# Patient Record
Sex: Male | Born: 1955 | Hispanic: No | Marital: Married | Smoking: Former smoker
Health system: Southern US, Community
[De-identification: ages and names within clinical notes are randomized; demographics above are authoritative.]

## PROBLEM LIST (undated history)

## (undated) DIAGNOSIS — I714 Abdominal aortic aneurysm, without rupture, unspecified: Secondary | ICD-10-CM

## (undated) DIAGNOSIS — J439 Emphysema, unspecified: Secondary | ICD-10-CM

## (undated) DIAGNOSIS — R351 Nocturia: Secondary | ICD-10-CM

## (undated) DIAGNOSIS — C679 Malignant neoplasm of bladder, unspecified: Secondary | ICD-10-CM

## (undated) DIAGNOSIS — Z973 Presence of spectacles and contact lenses: Secondary | ICD-10-CM

## (undated) DIAGNOSIS — I10 Essential (primary) hypertension: Secondary | ICD-10-CM

## (undated) DIAGNOSIS — R0609 Other forms of dyspnea: Secondary | ICD-10-CM

## (undated) DIAGNOSIS — I2510 Atherosclerotic heart disease of native coronary artery without angina pectoris: Secondary | ICD-10-CM

## (undated) DIAGNOSIS — R0600 Dyspnea, unspecified: Secondary | ICD-10-CM

## (undated) DIAGNOSIS — M352 Behcet's disease: Secondary | ICD-10-CM

## (undated) HISTORY — DX: Essential (primary) hypertension: I10

---

## 2011-11-14 ENCOUNTER — Ambulatory Visit: Payer: Self-pay | Admitting: Emergency Medicine

## 2011-11-14 VITALS — BP 153/90 | HR 84 | Temp 98.3°F | Resp 18 | Ht 70.0 in | Wt 229.2 lb

## 2011-11-14 DIAGNOSIS — I10 Essential (primary) hypertension: Secondary | ICD-10-CM

## 2011-11-14 MED ORDER — HYDROCHLOROTHIAZIDE 25 MG PO TABS: 25.0000 mg | ORAL_TABLET | Freq: Every day | ORAL | Status: DC

## 2011-11-14 NOTE — Progress Notes (Signed)
[facility] [address] [place] [address] [phone]  Date:  11/14/2011   Name:  Darius [name]   DOB:  September 10, 1955   MRN:  [id number]  PCP:  No primary provider on file.    Chief Complaint: Dizziness   History of Present Illness:  Darius [name] is a 56 y.o. very pleasant male patient who presents with the following:  Was told in past that he had elevated blood pressure and that he should take medication for it.  He has no health insurance and declined to do so.  He has never been screened for colon cancer, dyslipidemia, diabetes.  No ongoing medical care.  Yesterday had a dizzy spell and had his blood pressure checked an it was elevated in the 160 range.  He denies nausea, vomiting, visual or neurologic or cardiac symptoms.  Smokes a pack a day and takes no medication.  Is concerned that his pressure is elevated.  There is no problem list on file for this patient.   History reviewed. No pertinent past medical history.  History reviewed. No pertinent past surgical history.  History  Substance Use Topics  . Smoking status: Current Every Day Smoker  . Smokeless tobacco: Not on file  . Alcohol Use: No    History reviewed. No pertinent family history.  No Known Allergies  Medication list has been reviewed and updated.  No current outpatient prescriptions on file prior to visit.    Review of Systems:  As per HPI, otherwise negative.    Physical Examination: Filed Vitals:   11/14/11 1704  BP: 153/90  Pulse: 84  Temp: 98.3 F (36.8 C)  Resp: 18   Filed Vitals:   11/14/11 1704  Height: 5\' 10"  (1.778 m)  Weight: 229 lb 3.2 oz (103.964 kg)   Body mass index is 32.89 kg/(m^2). Ideal Body Weight: Weight in (lb) to have BMI = 25: 173.9   GEN: WDWN, NAD, Non-toxic, A & O x 3.  No shortness of breath. HEENT: Atraumatic, Normocephalic. Neck supple. No masses, No LAD.  PRRERLA EOMI CN 2-12 intact Ears and Nose: No external deformity.  TM  negative. CV: RRR, No M/G/R. No JVD. No thrill. No extra heart sounds.  No bruit PULM: CTA B, no wheezes, crackles, rhonchi. No retractions. No resp. distress. No accessory muscle use. ABD: S, NT, ND, +BS. No rebound. No HSM. EXTR: No c/c/e NEURO Normal gait, balance and coordination. PSYCH: Normally interactive. Conversant. Not depressed or anxious appearing.  Calm demeanor.    Assessment and Plan: Hypertension hctz Follow up in one month ASA daily  Carmelina Dane, MD

## 2012-02-12 ENCOUNTER — Other Ambulatory Visit: Payer: Self-pay | Admitting: Emergency Medicine

## 2012-11-09 ENCOUNTER — Ambulatory Visit: Payer: Self-pay | Admitting: Emergency Medicine

## 2012-11-09 VITALS — BP 110/70 | HR 106 | Temp 98.3°F | Resp 16 | Ht 69.0 in | Wt 226.0 lb

## 2012-11-09 DIAGNOSIS — N4889 Other specified disorders of penis: Secondary | ICD-10-CM

## 2012-11-09 DIAGNOSIS — N5089 Other specified disorders of the male genital organs: Secondary | ICD-10-CM

## 2012-11-09 DIAGNOSIS — N508 Other specified disorders of male genital organs: Secondary | ICD-10-CM

## 2012-11-09 LAB — POCT URINALYSIS DIPSTICK
Blood, UA: NEGATIVE
Glucose, UA: NEGATIVE
Nitrite, UA: NEGATIVE
Protein, UA: NEGATIVE
Spec Grav, UA: >=1.03
Urobilinogen, UA: 0.2
pH, UA: 5

## 2012-11-09 LAB — POCT UA - MICROSCOPIC ONLY
Bacteria, U Microscopic: NEGATIVE
Casts, Ur, LPF, POC: NEGATIVE
Crystals, Ur, HPF, POC: NEGATIVE
Mucus, UA: NEGATIVE
Yeast, UA: NEGATIVE

## 2012-11-09 NOTE — Patient Instructions (Signed)
Testicular Masses  Most testicular masses, such as a growth or a swelling, are benign. This means they are not cancerous. Common types of testicular masses include:    Hydrocele is the most common benign testicular mass in an adult. Hydroceles are generally soft, painless scrotal swellings that are collections of fluid in the scrotal sac. These can rapidly change size as the fluid enters or leaves.   Spermatoceles are generally soft, painless, benign swellings that are cyst-like masses in the scrotum containing fluid. They can rapidly change size as the fluid enters or leaves. They are more prominent while standing or exercising. Sometimes, spermatoceles may cause a sensation of heaviness or a dull ache.   Varicocele is an enlargement of the veins that drain the testicles. This condition can increase the risk of infertility. They are more prominent while standing or exercising. Sometimes, varicoceles may cause a sensation of heaviness or a dull ache.   Inguinal hernia is a bulge caused by a portion of intestine protruding into the scrotum through a weak area in the abdominal muscles. Hernias may or may not be painful. They are soft and usually enlarge with coughing or straining.   Torsion of the testis can cause a testicular mass that develops quickly and is associated with tenderness and/or fever. This is caused by a twisting of the testicle within the sac. It also reduces the blood supply and can destroy the testis if not treated quickly with surgery.   Epididymitis is inflammation of the epididymis (a structure attached to the testicle), usually caused by a sexually transmitted infection or a urinary tract infection. This generally shows up as testicular discomfort and swelling, and may include pain during urination.   Testicular appendages are remnants of tissue on the testis present since birth. A testicular appendage can twist on its blood supply and cause pain. In most cases, this is seen as a blue dot  on the scrotum.  A cancerous growth in the scrotum may first appear as a swelling. There may or may not be pain. The growth usually feels firm and shows up as a growth on the testicle. Any solid, firm growth in a testicle is considered cancer until proven otherwise.  Cancer of the testicle most commonly affects men 20 to 55 years old. Risk factors include prior testicular tumor and cryptorchidism (undescended testis). Occasionally, testicular cancer may appear with symptoms (problems) of metastasis. This means the tumor (abnormal growth) has spread and is causing other problems that may include cough, shortness of breath or weight loss. Monthly testicular self-exams are recommended for all men. Get in the habit of examining your own testicles. A good time is while taking a shower. Get to know what your testicles feel like so you will know if there is a new growth or change in them.  DIAGNOSIS   See your caregiver if you feel a growth in your testicle. Sometimes, all that is needed to make the diagnosis (determine what is wrong) is a physical exam. Your caregiver may shine a bright light through the scrotum to help make the diagnosis. This is called transillumination. The light will shine easily through a collection of fluid but will usually not shine through a tumor. Other testing, including blood tests and an ultrasound exam, may be done. An ultrasound exam bounces harmless sound waves off the testicles and produces a black and white picture almost like that produced by a camera. Diagnosis of testicular cancer can be made by measuring several substances in the   What is wrong determines how it is treated. Small hydroceles and spermatoceles often require no treatment. In some cases, however, they may be treated surgically. Hernias are repaired with surgery. Because epididymitis is usually caused by an infection, it is usually  treated with antibiotics. Varicoceles may be treated by surgery to tie off the affected veins. Testicular cancer treatment depends upon the type of cancer. Sometimes, some tissue is removed surgically as a way of trying to preserve the testicle but if a tumor is suspected, the preferred treatment is removal of the entire testicle. Further treatment may include watching the growth with strict follow-up, chemotherapy or radiation. If a growth has been found in a testicle, your caregiver will help you determine the best treatment. Document Released: 07/06/2002 Document Revised: 03/24/2011 Document Reviewed: 12/30/2004 [facility] Patient Information 2014 [place], [place]. Hydrocele, Adult Fluid can collect around the testicles. This fluid forms in a sac. This condition is called a hydrocele. The collected fluid causes swelling of the scrotum. Usually, it affects just one testicle. Most of the time, the condition does not cause pain. Sometimes, the hydrocele goes away on its own. Other times, surgery is needed to get rid of the fluid. CAUSES A hydrocele does not develop often. Different things can cause a hydrocele in a man, including:  Injury to the scrotum.  Infection.  X-ray of the area around the scrotum.  A tumor or cancer of the testicle.  Twisting of a testicle.  Decreased blood flow to the scrotum. SYMPTOMS   Swelling without pain. The hydrocele feels like a water-filled balloon.  Swelling with pain. This can occur if the hydrocele was caused by infection or twisting.  Mild discomfort in the scrotum.  The hydrocele may feel heavy.  Swelling that gets smaller when you lie down. DIAGNOSIS  Your caregiver will do a physical exam to decide if you have a hydrocele. This may include:  Asking questions about your overall health, today and in the past. Your caregiver may ask about any injuries, X-rays, or infections.  Pushing on your abdomen or asking you to change positions to see if the  size of the hydrocele changes.  Shining a light through the scrotum (transillumination) to see if the fluid inside the scrotum is clear.  Blood tests and urine tests to check for infection.  Imaging studies that take pictures of the scrotum and testicles. TREATMENT  Treatment depends in part on what caused the condition. Options include:  Watchful waiting. Your caregiver checks the hydrocele every so often.  Different surgeries to drain the fluid.  A needle may be put into the scrotum to drain fluid (needle aspiration). Fluid often returns after this type of treatment.  A cut (incision) may be made in the scrotum to remove the fluid sac (hydrocelectomy).  An incision may be made in the groin to repair a hydrocele that has contact with abdominal fluids (communicating hydrocele).  Medicines to treat an infection (antibiotics). HOME CARE INSTRUCTIONS  What you need to do at home may depend on the cause of the hydrocele and type of treatment. In general:  Take all medicine as directed by your caregiver. Follow the directions carefully.  Ask your caregiver if there is anything you should not do while you recover (activities, lifting, work, sex).  If you had surgery to repair a communicating hydrocele, recovery time may vary. Ask you caregiver about your recovery time.  Avoid heavy lifting for 4 to 6 weeks.  If you had an incision on the  scrotum or groin, wash it for 2 to 3 days after surgery. Do this as long as the skin is closed and there are no gaps in the wound. Wash gently, and avoid rubbing the incision.  Keep all follow-up appointments. SEEK MEDICAL CARE IF:   Your scrotum seems to be getting larger.  The area becomes more and more uncomfortable. SEEK IMMEDIATE MEDICAL CARE IF:  You have a fever. Document Released: 06/19/2009 Document Revised: 03/24/2011 Document Reviewed: 06/19/2009 [facility] Patient Information 2014 [place], [place].

## 2012-11-09 NOTE — Progress Notes (Signed)
[facility] [address] [place] [address] [phone]  Date:  11/09/2012   Name:  Darius [name]   DOB:  09-Apr-1955   MRN:  [id number]  PCP:  No primary provider on file.    Chief Complaint: Genital swelling   History of Present Illness:  Darius [name] is a 56 y.o. 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  No history of hernia, std, injury or overuse.  No improvement with over the counter medications or other home remedies. Denies other complaint or health concern today.   There are no active problems to display for this patient.   History reviewed. No pertinent past medical history.  History reviewed. No pertinent past surgical history.  History  Substance Use Topics  . Smoking status: Current Every Day Smoker  . Smokeless tobacco: Not on file  . Alcohol Use: No    History reviewed. No pertinent family history.  No Known Allergies  Medication list has been reviewed and updated.  Current Outpatient Prescriptions on File Prior to Visit  Medication Sig Dispense Refill  . hydrochlorothiazide (HYDRODIURIL) 25 MG tablet TAKE 1 TABLET BY MOUTH EVERY DAY  90 tablet  0   No current facility-administered medications on file prior to visit.    Review of Systems:  As per HPI, otherwise negative.    Physical Examination: Filed Vitals:   11/09/12 1256  BP: 110/70  Pulse: 106  Temp: 98.3 F (36.8 C)  Resp: 16   Filed Vitals:   11/09/12 1256  Height: 5\' 9"  (1.753 m)  Weight: 226 lb (102.513 kg)   Body mass index is 33.36 kg/(m^2). Ideal Body Weight: Weight in (lb) to have BMI = 25: 168.9   GEN: WDWN, NAD, Non-toxic, Alert & Oriented x 3 HEENT: Atraumatic, Normocephalic.  Ears and Nose: No external deformity. EXTR: No clubbing/cyanosis/edema NEURO: Normal gait.  PSYCH: Normally interactive. Conversant. Not depressed or anxious appearing.   Calm demeanor.  Genitalia:  Left testicle, epididymis, and cord are normal Right testicle and cord are normal and not tender.  There is a firm mass approximately the size of the testicle that is distinct from the testicle but attached that is not tender.  Assessment and Plan: Scrotal mass Ultrasound Urology referral   Signed,  Phillips Odor, MD

## 2012-11-10 LAB — HCG, QUANTITATIVE, PREGNANCY: hCG, Beta Chain, Quant, S: <2 m[IU]/mL

## 2012-11-12 ENCOUNTER — Ambulatory Visit
Admission: RE | Admit: 2012-11-12 | Discharge: 2012-11-12 | Disposition: A | Discharge status: Home / Self Care | Payer: No Typology Code available for payment source | Source: Ambulatory Visit | Attending: Emergency Medicine | Admitting: Emergency Medicine

## 2012-11-12 DIAGNOSIS — N4889 Other specified disorders of penis: Secondary | ICD-10-CM

## 2012-11-12 DIAGNOSIS — N5089 Other specified disorders of the male genital organs: Secondary | ICD-10-CM

## 2012-11-15 ENCOUNTER — Telehealth: Payer: Self-pay

## 2012-11-15 NOTE — Telephone Encounter (Signed)
Pt states that someone tried to call him about test results if someone can call him at [phone]

## 2012-11-15 NOTE — Telephone Encounter (Signed)
See US report  

## 2013-03-04 ENCOUNTER — Other Ambulatory Visit: Payer: Self-pay | Admitting: Emergency Medicine

## 2013-03-07 ENCOUNTER — Ambulatory Visit: Payer: Self-pay | Admitting: Family Medicine

## 2013-03-07 VITALS — BP 138/78 | HR 80 | Temp 97.8°F | Resp 16 | Ht 66.0 in | Wt 230.0 lb

## 2013-03-07 DIAGNOSIS — I10 Essential (primary) hypertension: Secondary | ICD-10-CM

## 2013-03-07 DIAGNOSIS — F17200 Nicotine dependence, unspecified, uncomplicated: Secondary | ICD-10-CM

## 2013-03-07 DIAGNOSIS — Z720 Tobacco use: Secondary | ICD-10-CM

## 2013-03-07 DIAGNOSIS — Z13220 Encounter for screening for lipoid disorders: Secondary | ICD-10-CM

## 2013-03-07 LAB — COMPREHENSIVE METABOLIC PANEL WITH GFR
ALT: 18 U/L (ref 0–53)
AST: 15 U/L (ref 0–37)
Albumin: 4.7 g/dL (ref 3.5–5.2)
Alkaline Phosphatase: 63 U/L (ref 39–117)
BUN: 13 mg/dL (ref 6–23)
CO2: 29 meq/L (ref 19–32)
Calcium: 9.9 mg/dL (ref 8.4–10.5)
Chloride: 104 meq/L (ref 96–112)
Creat: 0.72 mg/dL (ref 0.50–1.35)
Glucose, Bld: 80 mg/dL (ref 70–99)
Potassium: 4.6 meq/L (ref 3.5–5.3)
Sodium: 140 meq/L (ref 135–145)
Total Bilirubin: 0.7 mg/dL (ref 0.2–1.2)
Total Protein: 7.8 g/dL (ref 6.0–8.3)

## 2013-03-07 LAB — LIPID PANEL
Cholesterol: 186 mg/dL (ref 0–200)
HDL: 31 mg/dL — ABNORMAL LOW (ref >39)
LDL Cholesterol: 112 mg/dL — ABNORMAL HIGH (ref 0–99)
Total CHOL/HDL Ratio: 6 Ratio
Triglycerides: 217 mg/dL — ABNORMAL HIGH (ref <150)
VLDL: 43 mg/dL — ABNORMAL HIGH (ref 0–40)

## 2013-03-07 MED ORDER — VARENICLINE TARTRATE 0.5 MG X 11 & 1 MG X 42 PO MISC: ORAL | Status: DC

## 2013-03-07 MED ORDER — VARENICLINE TARTRATE 1 MG PO TABS: 1.0000 mg | ORAL_TABLET | Freq: Two times a day (BID) | ORAL | Status: DC

## 2013-03-07 MED ORDER — HYDROCHLOROTHIAZIDE 25 MG PO TABS: 25.0000 mg | ORAL_TABLET | Freq: Every day | ORAL | Status: DC

## 2013-03-07 NOTE — Progress Notes (Signed)
Chief Complaint:  Chief Complaint  Patient presents with  . Medication Refill    hydrocholorothiazide    HPI: Darius [name] is a 58 y.o. male who is here for BP meds  No SEs No CP, no SOB No msk aches Does not follow a low fat or low salt diet He has been doing well and does not take BP at home.    He has been smoking 30 years 1 ppd or more  He has tried to quit 1x for 3 days He denies having depression, or suicide or homicidal , does not have any hallucinations. He wants to quit, wife also smokes  Past Medical History  Diagnosis Date  . Hypertension    History reviewed. No pertinent past surgical history. History   Social History  . Marital Status: Married    Spouse Name: N/A    Number of Children: N/A  . Years of Education: N/A   Social History Main Topics  . Smoking status: Current Every Day Smoker  . Smokeless tobacco: None  . Alcohol Use: No  . Drug Use: No  . Sexual Activity: None   Other Topics Concern  . None   Social History Narrative  . None   History reviewed. No pertinent family history. No Known Allergies Prior to Admission medications   Medication Sig Start Date End Date Taking? Authorizing Provider  hydrochlorothiazide (HYDRODIURIL) 25 MG tablet Take 1 tablet (25 mg total) by mouth daily. PATIENT NEEDS OFFICE VISIT FOR ADDITIONAL REFILLS   Yes Mancel Bale, PA-C     ROS: The patient denies fevers, chills, night sweats, unintentional weight loss, chest pain, palpitations, wheezing, dyspnea on exertion, nausea, vomiting, abdominal pain, dysuria, hematuria, melena, numbness, weakness, or tingling.   All other systems have been reviewed and were otherwise negative with the exception of those mentioned in the HPI and as above.    PHYSICAL EXAM: Filed Vitals:   03/07/13 1409  BP: 138/78  Pulse: 80  Temp: 97.8 F (36.6 C)  Resp: 16   Filed Vitals:   03/07/13 1409  Height: 5\' 6"  (1.676 m)  Weight: 230 lb (104.327 kg)   Body  mass index is 37.14 kg/(m^2).  General: Alert, no acute distress HEENT:  Normocephalic, atraumatic, oropharynx patent. EOMI, PERRLA Cardiovascular:  Regular rate and rhythm, no rubs murmurs or gallops.  No Carotid bruits, radial pulse intact. No pedal edema.  Respiratory: Clear to auscultation bilaterally.  No wheezes, rales, or rhonchi.  No cyanosis, no use of accessory musculature GI: No organomegaly, abdomen is soft and non-tender, positive bowel sounds.  No masses. Skin: No rashes. Neurologic: Facial musculature symmetric. Psychiatric: Patient is appropriate throughout our interaction. Lymphatic: No cervical lymphadenopathy Musculoskeletal: Gait intact.   LABS: Results for orders placed in visit on 11/09/12  AFP TUMOR MARKER      Result Value Ref Range   AFP-Tumor Marker <1.3  0.0 - 8.0 ng/mL  HCG, QUANTITATIVE, PREGNANCY      Result Value Ref Range   hCG, Beta Chain, Quant, S <2.0    POCT UA - MICROSCOPIC ONLY      Result Value Ref Range   WBC, Ur, HPF, POC neg     RBC, urine, microscopic neg     Bacteria, U Microscopic neg     Mucus, UA neg     Epithelial cells, urine per micros 0-2     Crystals, Ur, HPF, POC neg     Casts, Ur, LPF, POC  neg     Yeast, UA neg    POCT URINALYSIS DIPSTICK      Result Value Ref Range   Color, UA yellow     Clarity, UA clear     Glucose, UA neg     Bilirubin, UA neg     Ketones, UA trace     Spec Grav, UA >=1.030     Blood, UA neg     pH, UA 5.0     Protein, UA neg     Urobilinogen, UA 0.2     Nitrite, UA neg     Leukocytes, UA Negative       EKG/XRAY:   Primary read interpreted by Dr. Marin Comment at [facility]   ASSESSMENT/PLAN: Encounter Diagnoses  Name Primary?  . Screening for hyperlipidemia Yes  . HTN (hypertension)   . Tobacco abuse    Refilled HCTZ CMP, lipid panel since he would like to know if he has hyperlipidemia. Rx Chantix, printed rx given in case he wants to quit smoking, he is trying to convince his wife to quit as  well, instructions given to him.  He denies any SI/HI/hallucinations.  F/u in 6 month Gross sideeffects, risk and benefits, and alternatives of medications d/w patient. Patient is aware that all medications have potential sideeffects and we are unable to predict every sideeffect or drug-drug interaction that may occur.  Helen Cuff, [place], DO 03/07/2013 2:58 PM

## 2013-03-17 ENCOUNTER — Encounter: Payer: Self-pay | Admitting: Family Medicine

## 2014-05-03 ENCOUNTER — Other Ambulatory Visit: Payer: Self-pay | Admitting: Family Medicine

## 2014-06-06 ENCOUNTER — Ambulatory Visit (INDEPENDENT_AMBULATORY_CARE_PROVIDER_SITE_OTHER): Payer: Self-pay | Admitting: Internal Medicine

## 2014-06-06 VITALS — BP 136/90 | HR 98 | Temp 99.0°F | Ht 69.5 in | Wt 237.8 lb

## 2014-06-06 DIAGNOSIS — I10 Essential (primary) hypertension: Secondary | ICD-10-CM

## 2014-06-06 DIAGNOSIS — Z7189 Other specified counseling: Secondary | ICD-10-CM

## 2014-06-06 DIAGNOSIS — M352 Behcet's disease: Secondary | ICD-10-CM

## 2014-06-06 LAB — POCT CBC
GRANULOCYTE PERCENT: 59.7 % (ref 37–80)
HCT, POC: 49.3 % (ref 43.5–53.7)
Hemoglobin: 16.3 g/dL (ref 14.1–18.1)
Lymph, poc: 3.1 (ref 0.6–3.4)
MCH, POC: 30.3 pg (ref 27–31.2)
MCHC: 33 g/dL (ref 31.8–35.4)
MCV: 91.7 fL (ref 80–97)
MID (cbc): 0.5 (ref 0–0.9)
MPV: 7.8 fL (ref 0–99.8)
POC Granulocyte: 5.4 (ref 2–6.9)
POC LYMPH PERCENT: 34.9 %L (ref 10–50)
POC MID %: 5.4 % (ref 0–12)
Platelet Count, POC: 316 10*3/uL (ref 142–424)
RBC: 5.38 M/uL (ref 4.69–6.13)
RDW, POC: 13.8 %
WBC: 9 10*3/uL (ref 4.6–10.2)

## 2014-06-06 LAB — BASIC METABOLIC PANEL
BUN: 10 mg/dL (ref 6–23)
CHLORIDE: 101 meq/L (ref 96–112)
CO2: 27 meq/L (ref 19–32)
CREATININE: 0.82 mg/dL (ref 0.50–1.35)
Calcium: 10 mg/dL (ref 8.4–10.5)
Glucose, Bld: 103 mg/dL — ABNORMAL HIGH (ref 70–99)
Potassium: 4.5 mEq/L (ref 3.5–5.3)
Sodium: 139 mEq/L (ref 135–145)

## 2014-06-06 LAB — POCT SEDIMENTATION RATE: POCT SED RATE: 28 mm/h — AB (ref 0–22)

## 2014-06-06 MED ORDER — CHLORTHALIDONE 25 MG PO TABS: 25.0000 mg | ORAL_TABLET | Freq: Every day | ORAL | Status: DC

## 2014-06-06 MED ORDER — DAPSONE 100 MG PO TABS: 100.0000 mg | ORAL_TABLET | Freq: Every day | ORAL | Status: DC

## 2014-06-06 MED ORDER — HYDROCHLOROTHIAZIDE 25 MG PO TABS: 25.0000 mg | ORAL_TABLET | Freq: Every day | ORAL | Status: DC

## 2014-06-06 NOTE — Patient Instructions (Addendum)
Behcet Syndrome Behcet syndrome is a rare disorder that involves inflammation of blood vessels (vasculitis) throughout your body. This condition usually begins between the ages of 53 years and 40 years. Behcet syndrome can range from mild to severe and is a condition you will have for the rest of your life (chronic). There is no cure, but symptoms may go away on their own for periods of time. It can cause various symptoms, including open sores (ulcers) in your mouth or on your genitals. It can affect many organs and systems in your body, including your heart, lungs, digestive system, and nervous systems. It can sometimes lead to blindness. Behcet syndrome is not spread from person to person (contagious). CAUSES  The exact cause is unknown. The condition tends to run in families. Some genes that increase risk for Behcet syndrome have been identified. If you inherit these genes, it may increase your risk.  RISK FACTORS  Being of Asian, Middle [place], or [place] descent.  Being 46-73 years of age. SYMPTOMS  Signs and symptoms vary depending on the areas of the body that are affected. Early and common signs and symptoms include:   Open sores on your:  Mouth. These may look like canker sores. The sores may come and go.  Genitals. These may come and go and leave scars when they heal.  Skin. These may appear as painful red bumps or pimples.  Eye problems including:  Redness.  Blurred vision.  Tearing.  Pain.  Inflammation (uveitis).  Arthritis.   Swelling of the brain and spinal cord (meningoencephalitis). Less common signs and symptoms may develop over time and can include:   Abdominal pain and bleeding in your digestive system.  Memory loss.  Behavior changes.  Loss of interest in things you enjoy (apathy).  Seizures.  Blood clots.  Weakening of blood vessels (aneurysms).  Chest pain.  Trouble breathing.  Impaired speech, balance, and movement. DIAGNOSIS  Behcet  syndrome is hard to diagnose because there will be times when you are symptom free. Your health care provider may diagnose the condition based on your medical history and a physical exam. The main factors that help confirm the diagnosis are presence of mouth sores at least three times in 1 year and any two of the following:  Genital sores that keep coming back.  Eye inflammation with loss of vision.  Skin sores that are characteristic of Behcet syndrome.  A positive skin prick test. If you have the condition, a skin prick may produce a red bump in 1-2 days. Yourhealth care provider may perform additional tests, including:   CT or MRI scans of your joints, brain, or bones.  Blood vessel studies (angiogram).  Removing pieces of affected body tissue (biopsy) to check for vasculitis. TREATMENT  There is no cure for Behcet syndrome. Treatment typically focuses on relieving your discomfort and preventing serious complications. You may need to work with a team of health care providers because many different parts of your body may be involved. Common treatments include:  Strong anti-inflammatory medicines (corticosteroids).  Medicines that suppress your immune system and treat inflammation.  Steroid creams to treat oral and genital ulcers.  Other medicines your health care team may recommend based on your symptoms and the parts of your body involved. HOME CARE INSTRUCTIONS Follow all your health care provider's instructions. Theinstructions you get will depend on your specific symptoms and treatments. General instructions may include:  Get plenty of rest, especially when your symptoms worsen.  Get moderate exercise (  walking and swimming) when not experiencing worsening of symptoms.  Include lots of vegetables, fruits, and whole grains in your diet.  Avoid high-fat foods.  Do not smoke.  Keep all follow-up appointments. SEEK MEDICAL CARE IF:  Your symptoms worsen and are not  managed by medicines and home care. SEEK IMMEDIATE MEDICAL CARE IF:  You suddenly lose your vision.  You vomit blood or have blood in your stool.  You have very bad abdominal pain.  You suddenly have a very bad headache.  You have a seizure.  You have a red, warm, or tender swelling in your leg.  You have chest pain or trouble breathing. FOR MORE INFORMATION American Behcet's Disease Association: www.behcets.com Document Released: 12/20/2001 Document Revised: 01/04/2013 Document Reviewed: 11/30/2012 [facility] Patient Information 2015 [place], [place]. This information is not intended to replace advice given to you by your health care provider. Make sure you discuss any questions you have with your health care provider. Ankle Exercises for Rehabilitation Following ankle injuries, it is as important to follow your caregiver's instructions for regaining full use of your ankle as it was to follow the initial treatment plan following the injury. The following are some suggestions for exercises and treatment, which can be done to help you regain full use of your ankle as soon as possible.  Follow all instructions regarding physical therapy.  Before exercising, it may be helpful to use heat on the muscles or joint being exercised. This loosens up the muscles and tendons (cordlike structure) and decreases chances of injury during your exercises. If this is not possible, just begin your exercises slowly to gradually warm up.  Stand on your toes several times per day to strengthen the calf muscles. These are the muscles in the back of your leg between the knee and the heel. The cord you can feel just above the heel is the Achilles tendon. Rise up on your toes several times repeating this three to four times per day. Do not exercise to the point of pain. If pain starts to develop, decrease the exercise until you are comfortable again.  Do range of motion exercises. This means moving the ankle in all  directions. Practice writing the alphabet with your toes in the air. Do not increase beyond a range that is comfortable.  Increase the strength of the muscles in the front of your leg by raising your toes and foot straight up in the air. Repeat this exercise as you did the calf exercise with the same warnings. This also help to stretch your muscles.  Stretch your calf muscles also by leaning against a wall with your hands in front of you. Put your feet a few feet from the wall and bend your knees until you feel the muscles in your calves become tight.  After exercising it may be helpful to put ice on the ankle to prevent swelling and improve rehabilitation. This may be done for 15 to 20 minutes following your exercises. If exercising is being done in the workplace, this may not always be possible.  Taping an ankle injury may be helpful to give added support following an injury. It also may help prevent reinjury. This may be true if you are in training or in a conditioning program. You and your caregiver can decide on the best course of action to follow. Document Released: 12/28/1999 Document Revised: 05/16/2013 Document Reviewed: 12/25/2007 [facility] Patient Information 2015 [place], [place]. This information is not intended to replace advice given to you by  your health care provider. Make sure you discuss any questions you have with your health care provider. DASH Eating Plan DASH stands for "Dietary Approaches to Stop Hypertension." The DASH eating plan is a healthy eating plan that has been shown to reduce high blood pressure (hypertension). Additional health benefits may include reducing the risk of type 2 diabetes mellitus, heart disease, and stroke. The DASH eating plan may also help with weight loss. WHAT DO I NEED TO KNOW ABOUT THE DASH EATING PLAN? For the DASH eating plan, you will follow these general guidelines:  Choose foods with a percent daily value for sodium of less than 5% (as listed  on the food label).  Use salt-free seasonings or herbs instead of table salt or sea salt.  Check with your health care provider or pharmacist before using salt substitutes.  Eat lower-sodium products, often labeled as "lower sodium" or "no salt added."  Eat fresh foods.  Eat more vegetables, fruits, and low-fat dairy products.  Choose whole grains. Look for the word "whole" as the first word in the ingredient list.  Choose fish and skinless chicken or [place] more often than red meat. Limit fish, poultry, and meat to 6 oz (170 g) each day.  Limit sweets, desserts, sugars, and sugary drinks.  Choose heart-healthy fats.  Limit cheese to 1 oz (28 g) per day.  Eat more home-cooked food and less restaurant, buffet, and fast food.  Limit fried foods.  Cook foods using methods other than frying.  Limit canned vegetables. If you do use them, rinse them well to decrease the sodium.  When eating at a restaurant, ask that your food be prepared with less salt, or no salt if possible. WHAT FOODS CAN I EAT? Seek help from a dietitian for individual calorie needs. Grains Whole grain or whole wheat bread. Brown rice. Whole grain or whole wheat pasta. Quinoa, bulgur, and whole grain cereals. Low-sodium cereals. Corn or whole wheat flour tortillas. Whole grain cornbread. Whole grain crackers. Low-sodium crackers. Vegetables Fresh or frozen vegetables (raw, steamed, roasted, or grilled). Low-sodium or reduced-sodium tomato and vegetable juices. Low-sodium or reduced-sodium tomato sauce and paste. Low-sodium or reduced-sodium canned vegetables.  Fruits All fresh, canned (in natural juice), or frozen fruits. Meat and Other Protein Products Ground beef (85% or leaner), grass-fed beef, or beef trimmed of fat. Skinless chicken or [place]. Ground chicken or [place]. Pork trimmed of fat. All fish and seafood. Eggs. Dried beans, peas, or lentils. Unsalted nuts and seeds. Unsalted canned  beans. Dairy Low-fat dairy products, such as skim or 1% milk, 2% or reduced-fat cheeses, low-fat ricotta or cottage cheese, or plain low-fat yogurt. Low-sodium or reduced-sodium cheeses. Fats and Oils Tub margarines without trans fats. Light or reduced-fat mayonnaise and salad dressings (reduced sodium). Avocado. Safflower, olive, or canola oils. Natural peanut or almond butter. Other Unsalted popcorn and pretzels. The items listed above may not be a complete list of recommended foods or beverages. Contact your dietitian for more options. WHAT FOODS ARE NOT RECOMMENDED? Grains White bread. White pasta. White rice. Refined cornbread. Bagels and croissants. Crackers that contain trans fat. Vegetables Creamed or fried vegetables. Vegetables in a cheese sauce. Regular canned vegetables. Regular canned tomato sauce and paste. Regular tomato and vegetable juices. Fruits Dried fruits. Canned fruit in light or heavy syrup. Fruit juice. Meat and Other Protein Products Fatty cuts of meat. Ribs, chicken wings, bacon, sausage, bologna, salami, chitterlings, fatback, hot dogs, bratwurst, and packaged luncheon meats. Salted nuts and seeds. Canned  beans with salt. Dairy Whole or 2% milk, cream, half-and-half, and cream cheese. Whole-fat or sweetened yogurt. Full-fat cheeses or blue cheese. Nondairy creamers and whipped toppings. Processed cheese, cheese spreads, or cheese curds. Condiments Onion and garlic salt, seasoned salt, table salt, and sea salt. Canned and packaged gravies. Worcestershire sauce. Tartar sauce. Barbecue sauce. Teriyaki sauce. Soy sauce, including reduced sodium. Steak sauce. Fish sauce. Oyster sauce. Cocktail sauce. Horseradish. Ketchup and mustard. Meat flavorings and tenderizers. Bouillon cubes. Hot sauce. Tabasco sauce. Marinades. Taco seasonings. Relishes. Fats and Oils Butter, stick margarine, lard, shortening, ghee, and bacon fat. Coconut, palm kernel, or palm oils. Regular salad  dressings. Other Pickles and olives. Salted popcorn and pretzels. The items listed above may not be a complete list of foods and beverages to avoid. Contact your dietitian for more information. WHERE CAN I FIND MORE INFORMATION? National Heart, Lung, and Blood Institute: travelstabloid.com Document Released: 12/19/2010 Document Revised: 05/16/2013 Document Reviewed: 11/03/2012 [facility] Patient Information 2015 [place], [place]. This information is not intended to replace advice given to you by your health care provider. Make sure you discuss any questions you have with your health care provider. Hypertension Hypertension, commonly called high blood pressure, is when the force of blood pumping through your arteries is too strong. Your arteries are the blood vessels that carry blood from your heart throughout your body. A blood pressure reading consists of a higher number over a lower number, such as 110/72. The higher number (systolic) is the pressure inside your arteries when your heart pumps. The lower number (diastolic) is the pressure inside your arteries when your heart relaxes. Ideally you want your blood pressure below 120/80. Hypertension forces your heart to work harder to pump blood. Your arteries may become narrow or stiff. Having hypertension puts you at risk for heart disease, stroke, and other problems.  RISK FACTORS Some risk factors for high blood pressure are controllable. Others are not.  Risk factors you cannot control include:   Race. You may be at higher risk if you are African American.  Age. Risk increases with age.  Gender. Men are at higher risk than women before age 21 years. After age 70, women are at higher risk than men. Risk factors you can control include:  Not getting enough exercise or physical activity.  Being overweight.  Getting too much fat, sugar, calories, or salt in your diet.  Drinking too much alcohol. SIGNS AND  SYMPTOMS Hypertension does not usually cause signs or symptoms. Extremely high blood pressure (hypertensive crisis) may cause headache, anxiety, shortness of breath, and nosebleed. DIAGNOSIS  To check if you have hypertension, your health care provider will measure your blood pressure while you are seated, with your arm held at the level of your heart. It should be measured at least twice using the same arm. Certain conditions can cause a difference in blood pressure between your right and left arms. A blood pressure reading that is higher than normal on one occasion does not mean that you need treatment. If one blood pressure reading is high, ask your health care provider about having it checked again. TREATMENT  Treating high blood pressure includes making lifestyle changes and possibly taking medicine. Living a healthy lifestyle can help lower high blood pressure. You may need to change some of your habits. Lifestyle changes may include:  Following the DASH diet. This diet is high in fruits, vegetables, and whole grains. It is low in salt, red meat, and added sugars.  Getting at least  2 hours of brisk physical activity every week.  Losing weight if necessary.  Not smoking.  Limiting alcoholic beverages.  Learning ways to reduce stress. If lifestyle changes are not enough to get your blood pressure under control, your health care provider may prescribe medicine. You may need to take more than one. Work closely with your health care provider to understand the risks and benefits. HOME CARE INSTRUCTIONS  Have your blood pressure rechecked as directed by your health care provider.   Take medicines only as directed by your health care provider. Follow the directions carefully. Blood pressure medicines must be taken as prescribed. The medicine does not work as well when you skip doses. Skipping doses also puts you at risk for problems.   Do not smoke.   Monitor your blood pressure at  home as directed by your health care provider. SEEK MEDICAL CARE IF:   You think you are having a reaction to medicines taken.  You have recurrent headaches or feel dizzy.  You have swelling in your ankles.  You have trouble with your vision. SEEK IMMEDIATE MEDICAL CARE IF:  You develop a severe headache or confusion.  You have unusual weakness, numbness, or feel faint.  You have severe chest or abdominal pain.  You vomit repeatedly.  You have trouble breathing. MAKE SURE YOU:   Understand these instructions.  Will watch your condition.  Will get help right away if you are not doing well or get worse. Document Released: 12/30/2004 Document Revised: 05/16/2013 Document Reviewed: 10/22/2012 [facility] Patient Information 2015 [place], [place]. This information is not intended to replace advice given to you by your health care provider. Make sure you discuss any questions you have with your health care provider. Colonoscopy A colonoscopy is an exam to look at the entire large intestine (colon). This exam can help find problems such as tumors, polyps, inflammation, and areas of bleeding. The exam takes about 1 hour.  LET [facility] CARE PROVIDER KNOW ABOUT:   Any allergies you have.  All medicines you are taking, including vitamins, herbs, eye drops, creams, and over-the-counter medicines.  Previous problems you or members of your family have had with the use of anesthetics.  Any blood disorders you have.  Previous surgeries you have had.  Medical conditions you have. RISKS AND COMPLICATIONS  Generally, this is a safe procedure. However, as with any procedure, complications can occur. Possible complications include:  Bleeding.  Tearing or rupture of the colon wall.  Reaction to medicines given during the exam.  Infection (rare). BEFORE THE PROCEDURE   Ask your health care provider about changing or stopping your regular medicines.  You may be prescribed an oral  bowel prep. This involves drinking a large amount of medicated liquid, starting the day before your procedure. The liquid will cause you to have multiple loose stools until your stool is almost clear or light green. This cleans out your colon in preparation for the procedure.  Do not eat or drink anything else once you have started the bowel prep, unless your health care provider tells you it is safe to do so.  Arrange for someone to drive you home after the procedure. PROCEDURE   You will be given medicine to help you relax (sedative).  You will lie on your side with your knees bent.  A long, flexible tube with a light and camera on the end (colonoscope) will be inserted through the rectum and into the colon. The camera sends video back to  a computer screen as it moves through the colon. The colonoscope also releases carbon dioxide gas to inflate the colon. This helps your health care provider see the area better.  During the exam, your health care provider may take a small tissue sample (biopsy) to be examined under a microscope if any abnormalities are found.  The exam is finished when the entire colon has been viewed. AFTER THE PROCEDURE   Do not drive for 24 hours after the exam.  You may have a small amount of blood in your stool.  You may pass moderate amounts of gas and have mild abdominal cramping or bloating. This is caused by the gas used to inflate your colon during the exam.  Ask when your test results will be ready and how you will get your results. Make sure you get your test results. Document Released: 12/28/1999 Document Revised: 10/20/2012 Document Reviewed: 09/06/2012 [facility] Patient Information 2015 [place], [place]. This information is not intended to replace advice given to you by your health care provider. Make sure you discuss any questions you have with your health care provider.

## 2014-06-06 NOTE — Progress Notes (Signed)
   Subjective:    Patient ID: Darius [name], male    DOB: Dec 29, 1955, 59 y.o.   MRN: [id number]  HPI 59 year old male here today for HCTZ med refill, has no primary care doctor and has not had a complete exam or testing. He also has no health insurance. 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 Saw Dr. Garnette Scheuermann at [facility] 20 yrs ago. No other autimmune problems since. Requests dapsone for prn use.   Review of Systems     Objective:   Physical Exam  Constitutional: He is oriented to person, place, and time. He appears well-nourished. No distress.  HENT:  Head: Normocephalic.  Mouth/Throat: Oropharynx is clear and moist.  Eyes: EOM are normal.  Cardiovascular: Normal rate and normal heart sounds.   Pulmonary/Chest: Effort normal and breath sounds normal.  Musculoskeletal: Normal range of motion. He exhibits tenderness.  Neurological: He is alert and oriented to person, place, and time. Coordination normal.  Psychiatric: He has a normal mood and affect. His behavior is normal.  Vitals reviewed.  Bmet/sed rste       Assessment & Plan:  Will see Dr. Garnette Scheuermann to reasess Behcets/Dapsone refilled for prn use RF HCTZ

## 2014-06-13 ENCOUNTER — Encounter: Payer: Self-pay | Admitting: Family Medicine

## 2015-06-12 ENCOUNTER — Other Ambulatory Visit: Payer: Self-pay | Admitting: Internal Medicine

## 2015-06-17 ENCOUNTER — Other Ambulatory Visit: Payer: Self-pay | Admitting: Internal Medicine

## 2015-07-03 ENCOUNTER — Other Ambulatory Visit: Payer: Self-pay | Admitting: Internal Medicine

## 2015-08-06 ENCOUNTER — Ambulatory Visit (INDEPENDENT_AMBULATORY_CARE_PROVIDER_SITE_OTHER): Payer: Self-pay | Admitting: Physician Assistant

## 2015-08-06 VITALS — BP 122/80 | HR 80 | Temp 98.2°F | Resp 18 | Ht 69.5 in | Wt 249.2 lb

## 2015-08-06 DIAGNOSIS — I10 Essential (primary) hypertension: Secondary | ICD-10-CM | POA: Insufficient documentation

## 2015-08-06 DIAGNOSIS — E7800 Pure hypercholesterolemia, unspecified: Secondary | ICD-10-CM

## 2015-08-06 LAB — COMPLETE METABOLIC PANEL WITH GFR
ALBUMIN: 4 g/dL (ref 3.6–5.1)
ALK PHOS: 77 U/L (ref 40–115)
ALT: 29 U/L (ref 9–46)
AST: 20 U/L (ref 10–35)
BILIRUBIN TOTAL: 0.7 mg/dL (ref 0.2–1.2)
BUN: 15 mg/dL (ref 7–25)
CALCIUM: 9 mg/dL (ref 8.6–10.3)
CO2: 26 mmol/L (ref 20–31)
Chloride: 103 mmol/L (ref 98–110)
Creat: 0.74 mg/dL (ref 0.70–1.33)
GLUCOSE: 138 mg/dL — AB (ref 65–99)
POTASSIUM: 4 mmol/L (ref 3.5–5.3)
Sodium: 139 mmol/L (ref 135–146)
Total Protein: 6.9 g/dL (ref 6.1–8.1)

## 2015-08-06 LAB — LIPID PANEL
CHOL/HDL RATIO: 6.1 ratio — AB (ref <=5.0)
CHOLESTEROL: 159 mg/dL (ref 125–200)
HDL: 26 mg/dL — AB (ref >=40)
LDL Cholesterol: 95 mg/dL (ref <130)
Triglycerides: 190 mg/dL — ABNORMAL HIGH (ref <150)
VLDL: 38 mg/dL — ABNORMAL HIGH (ref <30)

## 2015-08-06 MED ORDER — CHLORTHALIDONE 25 MG PO TABS: 25.0000 mg | ORAL_TABLET | Freq: Every day | ORAL | 5 refills | Status: DC

## 2015-08-06 NOTE — Progress Notes (Signed)
   Darius [name]  MRN: [id number] DOB: Sep 14, 1955  Subjective:  Pt presents to clinic for a medication refill. He is having no problems with his BP.  He takes his medication daily.  He will sometimes take his BP at a pharmacy and it is always a little high.  Review of Systems  Respiratory: Negative for cough and shortness of breath.   Cardiovascular: Negative for chest pain, palpitations and leg swelling.    Patient Active Problem List   Diagnosis Date Noted  . HTN (hypertension) 08/06/2015    Current Outpatient Prescriptions on File Prior to Visit  Medication Sig Dispense Refill  . dapsone 100 MG tablet TAKE 1 TABLET BY MOUTH EVERY DAY 30 tablet 0   No current facility-administered medications on file prior to visit.     No Known Allergies  Objective:  BP 122/80   Pulse 80   Temp 98.2 F (36.8 C) (Oral)   Resp 18   Ht 5' 9.5" (1.765 m)   Wt 249 lb 3.2 oz (113 kg)   SpO2 94%   BMI 36.27 kg/m   Physical Exam  Constitutional: He is oriented to person, place, and time and well-developed, well-nourished, and in no distress.  HENT:  Head: Normocephalic and atraumatic.  Right Ear: External ear normal.  Left Ear: External ear normal.  Eyes: Conjunctivae are normal.  Neck: Normal range of motion.  Cardiovascular: Normal rate, regular rhythm, normal heart sounds and intact distal pulses.   Pulmonary/Chest: Effort normal and breath sounds normal. He has no wheezes.  Musculoskeletal:       Right lower leg: He exhibits no edema.       Left lower leg: He exhibits no edema.  Neurological: He is alert and oriented to person, place, and time. Gait normal.  Skin: Skin is warm and dry.  Psychiatric: Mood, memory, affect and judgment normal.    Assessment and Plan :  Elevated cholesterol - Plan: Lipid panel  Essential hypertension - Plan: COMPLETE METABOLIC PANEL WITH GFR, chlorthalidone (HYGROTON) 25 MG tablet   Check labs and refilled medication today.    Windell Hummingbird  PA-C  [facility] 08/06/2015 8:53 AM

## 2015-08-06 NOTE — Patient Instructions (Signed)
     IF you received an x-ray today, you will receive an invoice from [facility]. Please contact [facility] at [phone] with questions or concerns regarding your invoice.   IF you received labwork today, you will receive an invoice from [facility]. Please contact [facility] at [phone] with questions or concerns regarding your invoice.   Our billing staff will not be able to assist you with questions regarding bills from these companies.  You will be contacted with the lab results as soon as they are available. The fastest way to get your results is to activate your My Chart account. Instructions are located on the last page of this paperwork. If you have not heard from us regarding the results in 2 weeks, please contact this office.      

## 2015-08-07 ENCOUNTER — Other Ambulatory Visit: Payer: Self-pay

## 2015-08-07 NOTE — Telephone Encounter (Addendum)
Pharm reqs RF of dapsone 100 mg. Judson Roch, this med was req's along with chlorthalidone. Judson Roch, you saw pt yesterday and filled his chlorthalidone, but wasn't sure if you didn't want pt taking the dapsone since it was not reordered at the time. Please advise.

## 2015-08-08 ENCOUNTER — Encounter: Payer: Self-pay | Admitting: Physician Assistant

## 2015-08-08 NOTE — Telephone Encounter (Signed)
I do not know why the patient is on this medication.

## 2015-08-09 NOTE — Telephone Encounter (Signed)
Is patient needing refill on Dapsone?  How is he using it?  Did he see the specialist at [facility].

## 2015-08-13 ENCOUNTER — Other Ambulatory Visit: Payer: Self-pay | Admitting: Physician Assistant

## 2015-08-14 NOTE — Telephone Encounter (Signed)
Darius [name] patient saw you on 08/06/15 can we refill?

## 2015-08-16 NOTE — Telephone Encounter (Signed)
Why is this patient on this medication? - there is nothing in his problem list and we did not discuss this at his visit.

## 2015-08-17 ENCOUNTER — Other Ambulatory Visit: Payer: Self-pay

## 2015-08-17 MED ORDER — DAPSONE 100 MG PO TABS: 100.0000 mg | ORAL_TABLET | Freq: Every day | ORAL | 0 refills | Status: DC

## 2015-08-17 NOTE — Telephone Encounter (Signed)
I will refill this once.

## 2015-08-17 NOTE — Telephone Encounter (Signed)
Duplicate message/request. Pending Sarah's review.

## 2015-08-17 NOTE — Telephone Encounter (Signed)
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 Saw Dr. Garnette Scheuermann at [facility] 20 yrs ago. No other autimmune problems since. Requests dapsone for prn use."

## 2015-08-17 NOTE — Telephone Encounter (Signed)
Pharm reqs RF of dapsone. Judson Roch, you just saw pt for med refills, but don't see dapsone discussed. OK to RF?

## 2015-08-21 ENCOUNTER — Encounter: Payer: Self-pay | Admitting: Physician Assistant

## 2015-08-21 DIAGNOSIS — M352 Behcet's disease: Secondary | ICD-10-CM | POA: Insufficient documentation

## 2015-09-26 ENCOUNTER — Other Ambulatory Visit: Payer: Self-pay | Admitting: Physician Assistant

## 2015-09-26 ENCOUNTER — Telehealth: Payer: Self-pay

## 2015-09-26 NOTE — Telephone Encounter (Signed)
[facility] called requesting a refill for patient. Dapsone 100 MG.

## 2015-09-29 ENCOUNTER — Ambulatory Visit (INDEPENDENT_AMBULATORY_CARE_PROVIDER_SITE_OTHER): Payer: Self-pay | Admitting: Urgent Care

## 2015-09-29 VITALS — BP 124/72 | HR 84 | Temp 98.3°F | Resp 16 | Ht 69.5 in | Wt 244.4 lb

## 2015-09-29 DIAGNOSIS — R7302 Impaired glucose tolerance (oral): Secondary | ICD-10-CM

## 2015-09-29 DIAGNOSIS — F17200 Nicotine dependence, unspecified, uncomplicated: Secondary | ICD-10-CM

## 2015-09-29 DIAGNOSIS — M352 Behcet's disease: Secondary | ICD-10-CM

## 2015-09-29 LAB — CBC
HEMATOCRIT: 41.9 % (ref 38.5–50.0)
Hemoglobin: 13.8 g/dL (ref 13.2–17.1)
MCH: 31.4 pg (ref 27.0–33.0)
MCHC: 32.9 g/dL (ref 32.0–36.0)
MCV: 95.4 fL (ref 80.0–100.0)
MPV: 10.4 fL (ref 7.5–12.5)
PLATELETS: 250 10*3/uL (ref 140–400)
RBC: 4.39 MIL/uL (ref 4.20–5.80)
RDW: 13.1 % (ref 11.0–15.0)
WBC: 7.2 10*3/uL (ref 3.8–10.8)

## 2015-09-29 LAB — SEDIMENTATION RATE: Sed Rate: 10 mm/hr (ref 0–20)

## 2015-09-29 MED ORDER — DAPSONE 100 MG PO TABS: 100.0000 mg | ORAL_TABLET | Freq: Every day | ORAL | 5 refills | Status: DC

## 2015-09-29 NOTE — Progress Notes (Signed)
    MRN: [id number] DOB: Jul 28, 1955  Subjective:   Darius [name] is a 60 y.o. 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 Denies any recent flare up, no genital sores, eye irritation, rashes. His last cmet was 07/2015, had normal liver enzymes.   Smoking cessation - Currently smokes 1ppd, greater than 30 pack year history. Has mild shob with strenuous activity. Denies chest pain, wheezing, fever, weight loss.   Darius [name] has a current medication list which includes the following prescription(s): chlorthalidone and dapsone. Also has No Known Allergies.  Darius [name]  has a past medical history of Hypertension. Also  has no past surgical history on file.  Objective:   Vitals: BP 124/72 (BP Location: Right Arm, Patient Position: Sitting, Cuff Size: Large)   Pulse 84   Temp 98.3 F (36.8 C) (Oral)   Resp 16   Ht 5' 9.5" (1.765 m)   Wt 244 lb 6.4 oz (110.9 kg)   SpO2 95%   BMI 35.57 kg/m   Physical Exam  Constitutional: He is oriented to person, place, and time. He appears well-developed and well-nourished.  HENT:  Mouth/Throat: Oropharynx is clear and moist.  Eyes: No scleral icterus.  Neck: Normal range of motion. Neck supple.  Cardiovascular: Normal rate, regular rhythm and intact distal pulses.  Exam reveals no gallop and no friction rub.   No murmur heard. Pulmonary/Chest: No respiratory distress. He has no wheezes. He has no rales.  Lymphadenopathy:    He has no cervical adenopathy.  Neurological: He is alert and oriented to person, place, and time.  Skin: Skin is warm and dry.   Assessment and Plan :   1. Behcet's disease ([place]) - Labs pending, refill provided.   2. Glucose intolerance (impaired glucose tolerance) - Hemoglobin A1c pending.  3. Tobacco use disorder - Counseled on smoking cessation,  patient declined scripts for Chantix, Wellbutrin.  Jaynee Eagles, PA-C [facility] [phone] 09/29/2015 10:50 AM

## 2015-09-29 NOTE — Patient Instructions (Addendum)
Smoking Cessation, Tips for Success If you are ready to quit smoking, congratulations! You have chosen to help yourself be healthier. Cigarettes bring nicotine, tar, carbon monoxide, and other irritants into your body. Your lungs, heart, and blood vessels will be able to work better without these poisons. There are many different ways to quit smoking. Nicotine gum, nicotine patches, a nicotine inhaler, or nicotine nasal spray can help with physical craving. Hypnosis, support groups, and medicines help break the habit of smoking. WHAT THINGS CAN I DO TO MAKE QUITTING EASIER?  Here are some tips to help you quit for good:  Pick a date when you will quit smoking completely. Tell all of your friends and family about your plan to quit on that date.  Do not try to slowly cut down on the number of cigarettes you are smoking. Pick a quit date and quit smoking completely starting on that day.  Throw away all cigarettes.   Clean and remove all ashtrays from your home, work, and car.  On a card, write down your reasons for quitting. Carry the card with you and read it when you get the urge to smoke.  Cleanse your body of nicotine. Drink enough water and fluids to keep your urine clear or pale yellow. Do this after quitting to flush the nicotine from your body.  Learn to predict your moods. Do not let a bad situation be your excuse to have a cigarette. Some situations in your life might tempt you into wanting a cigarette.  Never have "just one" cigarette. It leads to wanting another and another. Remind yourself of your decision to quit.  Change habits associated with smoking. If you smoked while driving or when feeling stressed, try other activities to replace smoking. Stand up when drinking your coffee. Brush your teeth after eating. Sit in a different chair when you read the paper. Avoid alcohol while trying to quit, and try to drink fewer caffeinated beverages. Alcohol and caffeine may urge you to  smoke.  Avoid foods and drinks that can trigger a desire to smoke, such as sugary or spicy foods and alcohol.  Ask people who smoke not to smoke around you.  Have something planned to do right after eating or having a cup of coffee. For example, plan to take a walk or exercise.  Try a relaxation exercise to calm you down and decrease your stress. Remember, you may be tense and nervous for the first 2 weeks after you quit, but this will pass.  Find new activities to keep your hands busy. Play with a pen, coin, or rubber band. Doodle or draw things on paper.  Brush your teeth right after eating. This will help cut down on the craving for the taste of tobacco after meals. You can also try mouthwash.   Use oral substitutes in place of cigarettes. Try using lemon drops, carrots, cinnamon sticks, or chewing gum. Keep them handy so they are available when you have the urge to smoke.  When you have the urge to smoke, try deep breathing.  Designate your home as a nonsmoking area.  If you are a heavy smoker, ask your health care provider about a prescription for nicotine chewing gum. It can ease your withdrawal from nicotine.  Reward yourself. Set aside the cigarette money you save and buy yourself something nice.  Look for support from others. Join a support group or smoking cessation program. Ask someone at home or at work to help you with your plan   to quit smoking.  Always ask yourself, "Do I need this cigarette or is this just a reflex?" Tell yourself, "Today, I choose not to smoke," or "I do not want to smoke." You are reminding yourself of your decision to quit.  Do not replace cigarette smoking with electronic cigarettes (commonly called e-cigarettes). The safety of e-cigarettes is unknown, and some may contain harmful chemicals.  If you relapse, do not give up! Plan ahead and think about what you will do the next time you get the urge to smoke. HOW WILL I FEEL WHEN I QUIT SMOKING? You  may have symptoms of withdrawal because your body is used to nicotine (the addictive substance in cigarettes). You may crave cigarettes, be irritable, feel very hungry, cough often, get headaches, or have difficulty concentrating. The withdrawal symptoms are only temporary. They are strongest when you first quit but will go away within 10-14 days. When withdrawal symptoms occur, stay in control. Think about your reasons for quitting. Remind yourself that these are signs that your body is healing and getting used to being without cigarettes. Remember that withdrawal symptoms are easier to treat than the major diseases that smoking can cause.  Even after the withdrawal is over, expect periodic urges to smoke. However, these cravings are generally short lived and will go away whether you smoke or not. Do not smoke! WHAT RESOURCES ARE AVAILABLE TO HELP ME QUIT SMOKING? Your health care provider can direct you to community resources or hospitals for support, which may include:  Group support.  Education.  Hypnosis.  Therapy.   This information is not intended to replace advice given to you by your health care provider. Make sure you discuss any questions you have with your health care provider.   Document Released: 09/28/2003 Document Revised: 01/20/2014 Document Reviewed: 06/17/2012 Elsevier Interactive Patient Education 2016 [employer].     IF you received an x-ray today, you will receive an invoice from [facility]. Please contact [facility] at [phone] with questions or concerns regarding your invoice.   IF you received labwork today, you will receive an invoice from [employer]. Please contact [facility] at [phone] with questions or concerns regarding your invoice.   Our billing staff will not be able to assist you with questions regarding bills from these companies.  You will be contacted with the lab results as soon as they are  available. The fastest way to get your results is to activate your My Chart account. Instructions are located on the last page of this paperwork. If you have not heard from [place] regarding the results in 2 weeks, please contact this office.

## 2015-09-30 LAB — HEMOGLOBIN A1C
Hgb A1c MFr Bld: 5.1 % (ref <5.7)
MEAN PLASMA GLUCOSE: 100 mg/dL

## 2015-10-01 ENCOUNTER — Encounter: Payer: Self-pay | Admitting: Urgent Care

## 2016-02-10 ENCOUNTER — Other Ambulatory Visit: Payer: Self-pay | Admitting: Physician Assistant

## 2016-02-10 DIAGNOSIS — I10 Essential (primary) hypertension: Secondary | ICD-10-CM

## 2016-03-17 ENCOUNTER — Other Ambulatory Visit: Payer: Self-pay | Admitting: Physician Assistant

## 2016-03-17 DIAGNOSIS — I10 Essential (primary) hypertension: Secondary | ICD-10-CM

## 2016-03-18 ENCOUNTER — Other Ambulatory Visit: Payer: Self-pay | Admitting: *Deleted

## 2016-03-18 DIAGNOSIS — M352 Behcet's disease: Secondary | ICD-10-CM

## 2016-03-18 MED ORDER — DAPSONE 100 MG PO TABS: 100.0000 mg | ORAL_TABLET | Freq: Every day | ORAL | 1 refills | Status: DC

## 2016-04-12 ENCOUNTER — Other Ambulatory Visit: Payer: Self-pay | Admitting: Physician Assistant

## 2016-04-12 DIAGNOSIS — I10 Essential (primary) hypertension: Secondary | ICD-10-CM

## 2016-05-18 ENCOUNTER — Other Ambulatory Visit: Payer: Self-pay | Admitting: Urgent Care

## 2016-05-18 DIAGNOSIS — I10 Essential (primary) hypertension: Secondary | ICD-10-CM

## 2016-06-22 ENCOUNTER — Other Ambulatory Visit: Payer: Self-pay | Admitting: Urgent Care

## 2016-06-22 DIAGNOSIS — M352 Behcet's disease: Secondary | ICD-10-CM

## 2016-06-23 ENCOUNTER — Other Ambulatory Visit: Payer: Self-pay | Admitting: Urgent Care

## 2016-06-23 DIAGNOSIS — I10 Essential (primary) hypertension: Secondary | ICD-10-CM

## 2016-06-25 ENCOUNTER — Encounter: Payer: Self-pay | Admitting: Emergency Medicine

## 2016-06-25 ENCOUNTER — Ambulatory Visit (INDEPENDENT_AMBULATORY_CARE_PROVIDER_SITE_OTHER): Payer: Self-pay | Admitting: Emergency Medicine

## 2016-06-25 VITALS — BP 140/80 | HR 75 | Temp 98.4°F | Resp 16 | Ht 69.5 in | Wt 243.6 lb

## 2016-06-25 DIAGNOSIS — M352 Behcet's disease: Secondary | ICD-10-CM

## 2016-06-25 DIAGNOSIS — I10 Essential (primary) hypertension: Secondary | ICD-10-CM

## 2016-06-25 MED ORDER — DAPSONE 100 MG PO TABS: 100.0000 mg | ORAL_TABLET | Freq: Every day | ORAL | 0 refills | Status: DC

## 2016-06-25 MED ORDER — CHLORTHALIDONE 25 MG PO TABS: 25.0000 mg | ORAL_TABLET | Freq: Every day | ORAL | 3 refills | Status: DC

## 2016-06-25 NOTE — Patient Instructions (Addendum)
   IF you received an x-ray today, you will receive an invoice from [facility]. Please contact [facility] at [phone] with questions or concerns regarding your invoice.   IF you received labwork today, you will receive an invoice from [facility]. Please contact [facility] at [phone] with questions or concerns regarding your invoice.   Our billing staff will not be able to assist you with questions regarding bills from these companies.  You will be contacted with the lab results as soon as they are available. The fastest way to get your results is to activate your My Chart account. Instructions are located on the last page of this paperwork. If you have not heard from us regarding the results in 2 weeks, please contact this office.     Hypertension Hypertension is another name for high blood pressure. High blood pressure forces your heart to work harder to pump blood. This can cause problems over time. There are two numbers in a blood pressure reading. There is a top number (systolic) over a bottom number (diastolic). It is best to have a blood pressure below 120/80. Healthy choices can help lower your blood pressure. You may need medicine to help lower your blood pressure if:  Your blood pressure cannot be lowered with healthy choices.  Your blood pressure is higher than 130/80.  Follow these instructions at home: Eating and drinking  If directed, follow the DASH eating plan. This diet includes: ? Filling half of your plate at each meal with fruits and vegetables. ? Filling one quarter of your plate at each meal with whole grains. Whole grains include whole wheat pasta, brown rice, and whole grain bread. ? Eating or drinking low-fat dairy products, such as skim milk or low-fat yogurt. ? Filling one quarter of your plate at each meal with low-fat (lean) proteins. Low-fat proteins include fish, skinless chicken, eggs, beans, and tofu. ? Avoiding fatty meat, cured  and processed meat, or chicken with skin. ? Avoiding premade or processed food.  Eat less than 1,500 mg of salt (sodium) a day.  Limit alcohol use to no more than 1 drink a day for nonpregnant women and 2 drinks a day for men. One drink equals 12 oz of beer, 5 oz of wine, or 1 oz of hard liquor. Lifestyle  Work with your doctor to stay at a healthy weight or to lose weight. Ask your doctor what the best weight is for you.  Get at least 30 minutes of exercise that causes your heart to beat faster (aerobic exercise) most days of the week. This may include walking, swimming, or biking.  Get at least 30 minutes of exercise that strengthens your muscles (resistance exercise) at least 3 days a week. This may include lifting weights or pilates.  Do not use any products that contain nicotine or tobacco. This includes cigarettes and e-cigarettes. If you need help quitting, ask your doctor.  Check your blood pressure at home as told by your doctor.  Keep all follow-up visits as told by your doctor. This is important. Medicines  Take over-the-counter and prescription medicines only as told by your doctor. Follow directions carefully.  Do not skip doses of blood pressure medicine. The medicine does not work as well if you skip doses. Skipping doses also puts you at risk for problems.  Ask your doctor about side effects or reactions to medicines that you should watch for. Contact a doctor if:  You think you are having a reaction to the   medicine you are taking.  You have headaches that keep coming back (recurring).  You feel dizzy.  You have swelling in your ankles.  You have trouble with your vision. Get help right away if:  You get a very bad headache.  You start to feel confused.  You feel weak or numb.  You feel faint.  You get very bad pain in your: ? Chest. ? Belly (abdomen).  You throw up (vomit) more than once.  You have trouble breathing. Summary  Hypertension is  another name for high blood pressure.  Making healthy choices can help lower blood pressure. If your blood pressure cannot be controlled with healthy choices, you may need to take medicine. This information is not intended to replace advice given to you by your health care provider. Make sure you discuss any questions you have with your health care provider. Document Released: 06/18/2007 Document Revised: 11/28/2015 Document Reviewed: 11/28/2015 Elsevier Interactive Patient Education  2018 Elsevier Inc.  

## 2016-06-25 NOTE — Progress Notes (Signed)
Darius [name] 61 y.o.   Chief Complaint  Patient presents with  . Medication Refill    chlorthalidone and dapsone    HISTORY OF PRESENT ILLNESS: This is a 61 y.o. male doing well; needs medication refills; has no complaints or new medical concerns.  HPI   Prior to Admission medications   Medication Sig Start Date End Date Taking? Authorizing Provider  chlorthalidone (HYGROTON) 25 MG tablet TAKE 1 TABLET BY MOUTH DAILY 05/19/16  Yes Darius Eagles, PA-C  dapsone 100 MG tablet Take 1 tablet (100 mg total) by mouth daily. No more refills without office visit 06/24/16  Yes Darius Eagles, PA-C    No Known Allergies  Patient Active Problem List   Diagnosis Date Noted  . Behcet's disease ([place]) 08/21/2015  . HTN (hypertension) 08/06/2015    Past Medical History:  Diagnosis Date  . Hypertension     No past surgical history on file.  Social History   Social History  . Marital status: Married    Spouse name: N/A  . Number of children: N/A  . Years of education: N/A   Occupational History  . Not on file.   Social History Main Topics  . Smoking status: Current Every Day Smoker  . Smokeless tobacco: Never Used  . Alcohol use No  . Drug use: No  . Sexual activity: Not on file   Other Topics Concern  . Not on file   Social History Narrative  . No narrative on file    No family history on file.   Review of Systems  Constitutional: Negative.  Negative for chills, fever and weight loss.  HENT: Negative.  Negative for hearing loss, nosebleeds and sore throat.   Eyes: Negative.  Negative for blurred vision and double vision.  Respiratory: Negative.  Negative for cough, hemoptysis and shortness of breath.   Cardiovascular: Negative.  Negative for chest pain, palpitations, claudication and leg swelling.  Gastrointestinal: Negative.  Negative for abdominal pain, diarrhea, nausea and vomiting.  Genitourinary: Negative.  Negative for dysuria, frequency and hematuria.    Musculoskeletal: Negative.  Negative for myalgias and neck pain.  Skin: Negative.  Negative for rash.  Neurological: Negative.  Negative for dizziness, sensory change, focal weakness, seizures and headaches.  Endo/Heme/Allergies: Negative.   All other systems reviewed and are negative.  Vitals:   06/25/16 1159  BP: 140/80  Pulse: 75  Resp: 16  Temp: 98.4 F (36.9 C)     Physical Exam  Constitutional: He is oriented to person, place, and time. He appears well-developed and well-nourished.  HENT:  Head: Normocephalic and atraumatic.  Nose: Nose normal.  Mouth/Throat: Oropharynx is clear and moist. No oropharyngeal exudate.  Eyes: Conjunctivae and EOM are normal. Pupils are equal, round, and reactive to light.  Neck: Normal range of motion. Neck supple. No JVD present. No thyromegaly present.  Cardiovascular: Normal rate, regular rhythm and normal heart sounds.   Pulmonary/Chest: Effort normal and breath sounds normal.  Abdominal: Soft. Bowel sounds are normal. He exhibits no distension. There is no tenderness.  Musculoskeletal: Normal range of motion.  Lymphadenopathy:    He has no cervical adenopathy.  Neurological: He is alert and oriented to person, place, and time. No sensory deficit. He exhibits normal muscle tone.  Skin: Skin is warm and dry. Capillary refill takes less than 2 seconds. No rash noted.  Psychiatric: He has a normal mood and affect. His behavior is normal.  Vitals reviewed.    ASSESSMENT & PLAN:  Darius [name] was seen today for medication refill.  Diagnoses and all orders for this visit:  Essential hypertension -     CBC with Differential/Platelet -     Comprehensive metabolic panel -     Hemoglobin A1c -     Lipid panel -     PSA -     TSH -     Hepatitis C antibody -     HIV antibody -     Ambulatory referral to Gastroenterology  Behcet's disease ([place]) -     dapsone 100 MG tablet; Take 1 tablet (100 mg total) by mouth daily. No more refills  without office visit  Other orders -     chlorthalidone (HYGROTON) 25 MG tablet; Take 1 tablet (25 mg total) by mouth daily.    Patient Instructions       IF you received an x-ray today, you will receive an invoice from [facility]. Please contact [facility] at [phone] with questions or concerns regarding your invoice.   IF you received labwork today, you will receive an invoice from [facility]. Please contact [facility] at [phone] with questions or concerns regarding your invoice.   Our billing staff will not be able to assist you with questions regarding bills from these companies.  You will be contacted with the lab results as soon as they are available. The fastest way to get your results is to activate your My Chart account. Instructions are located on the last page of this paperwork. If you have not heard from [place] regarding the results in 2 weeks, please contact this office.     Hypertension Hypertension is another name for high blood pressure. High blood pressure forces your heart to work harder to pump blood. This can cause problems over time. There are two numbers in a blood pressure reading. There is a top number (systolic) over a bottom number (diastolic). It is best to have a blood pressure below 120/80. Healthy choices can help lower your blood pressure. You may need medicine to help lower your blood pressure if:  Your blood pressure cannot be lowered with healthy choices.  Your blood pressure is higher than 130/80.  Follow these instructions at home: Eating and drinking  If directed, follow the DASH eating plan. This diet includes: ? Filling half of your plate at each meal with fruits and vegetables. ? Filling one quarter of your plate at each meal with whole grains. Whole grains include whole wheat pasta, brown rice, and whole grain bread. ? Eating or drinking low-fat dairy products, such as skim milk or low-fat yogurt. ? Filling one  quarter of your plate at each meal with low-fat (lean) proteins. Low-fat proteins include fish, skinless chicken, eggs, beans, and tofu. ? Avoiding fatty meat, cured and processed meat, or chicken with skin. ? Avoiding premade or processed food.  Eat less than 1,500 mg of salt (sodium) a day.  Limit alcohol use to no more than 1 drink a day for nonpregnant women and 2 drinks a day for men. One drink equals 12 oz of beer, 5 oz of wine, or 1 oz of hard liquor. Lifestyle  Work with your doctor to stay at a healthy weight or to lose weight. Ask your doctor what the best weight is for you.  Get at least 30 minutes of exercise that causes your heart to beat faster (aerobic exercise) most days of the week. This may include walking, swimming, or biking.  Get at least 30 minutes of exercise  that strengthens your muscles (resistance exercise) at least 3 days a week. This may include lifting weights or pilates.  Do not use any products that contain nicotine or tobacco. This includes cigarettes and e-cigarettes. If you need help quitting, ask your doctor.  Check your blood pressure at home as told by your doctor.  Keep all follow-up visits as told by your doctor. This is important. Medicines  Take over-the-counter and prescription medicines only as told by your doctor. Follow directions carefully.  Do not skip doses of blood pressure medicine. The medicine does not work as well if you skip doses. Skipping doses also puts you at risk for problems.  Ask your doctor about side effects or reactions to medicines that you should watch for. Contact a doctor if:  You think you are having a reaction to the medicine you are taking.  You have headaches that keep coming back (recurring).  You feel dizzy.  You have swelling in your ankles.  You have trouble with your vision. Get help right away if:  You get a very bad headache.  You start to feel confused.  You feel weak or numb.  You feel  faint.  You get very bad pain in your: ? Chest. ? Belly (abdomen).  You throw up (vomit) more than once.  You have trouble breathing. Summary  Hypertension is another name for high blood pressure.  Making healthy choices can help lower blood pressure. If your blood pressure cannot be controlled with healthy choices, you may need to take medicine. This information is not intended to replace advice given to you by your health care provider. Make sure you discuss any questions you have with your health care provider. Document Released: 06/18/2007 Document Revised: 11/28/2015 Document Reviewed: 11/28/2015 Elsevier Interactive Patient Education  2018 [employer]vier Inc.     Agustina Caroli, MD [facility]

## 2016-06-26 LAB — CBC WITH DIFFERENTIAL/PLATELET
BASOS: 0 %
Basophils Absolute: 0 10*3/uL (ref 0.0–0.2)
EOS (ABSOLUTE): 0.3 10*3/uL (ref 0.0–0.4)
EOS: 3 %
HEMATOCRIT: 42.3 % (ref 37.5–51.0)
Hemoglobin: 14.1 g/dL (ref 13.0–17.7)
Immature Grans (Abs): 0 10*3/uL (ref 0.0–0.1)
Immature Granulocytes: 0 %
LYMPHS ABS: 3.3 10*3/uL — AB (ref 0.7–3.1)
Lymphs: 38 %
MCH: 31.8 pg (ref 26.6–33.0)
MCHC: 33.3 g/dL (ref 31.5–35.7)
MCV: 96 fL (ref 79–97)
MONOS ABS: 0.6 10*3/uL (ref 0.1–0.9)
Monocytes: 7 %
Neutrophils Absolute: 4.5 10*3/uL (ref 1.4–7.0)
Neutrophils: 52 %
Platelets: 273 10*3/uL (ref 150–379)
RBC: 4.43 x10E6/uL (ref 4.14–5.80)
RDW: 13.7 % (ref 12.3–15.4)
WBC: 8.7 10*3/uL (ref 3.4–10.8)

## 2016-06-26 LAB — TSH: TSH: 0.954 u[IU]/mL (ref 0.450–4.500)

## 2016-06-26 LAB — COMPREHENSIVE METABOLIC PANEL
A/G RATIO: 1.5 (ref 1.2–2.2)
ALK PHOS: 79 IU/L (ref 39–117)
ALT: 30 IU/L (ref 0–44)
AST: 22 IU/L (ref 0–40)
Albumin: 4.5 g/dL (ref 3.5–5.5)
BILIRUBIN TOTAL: 0.7 mg/dL (ref 0.0–1.2)
BUN/Creatinine Ratio: 13 (ref 9–20)
BUN: 11 mg/dL (ref 6–24)
CHLORIDE: 102 mmol/L (ref 96–106)
CO2: 24 mmol/L (ref 20–29)
Calcium: 9.7 mg/dL (ref 8.7–10.2)
Creatinine, Ser: 0.86 mg/dL (ref 0.76–1.27)
GFR calc Af Amer: 110 mL/min/{1.73_m2} (ref >59)
GFR calc non Af Amer: 96 mL/min/{1.73_m2} (ref >59)
GLOBULIN, TOTAL: 3 g/dL (ref 1.5–4.5)
Glucose: 117 mg/dL — ABNORMAL HIGH (ref 65–99)
POTASSIUM: 4.3 mmol/L (ref 3.5–5.2)
SODIUM: 142 mmol/L (ref 134–144)
Total Protein: 7.5 g/dL (ref 6.0–8.5)

## 2016-06-26 LAB — HIV ANTIBODY (ROUTINE TESTING W REFLEX): HIV SCREEN 4TH GENERATION: NONREACTIVE

## 2016-06-26 LAB — LIPID PANEL
Chol/HDL Ratio: 5.4 ratio — ABNORMAL HIGH (ref 0.0–5.0)
Cholesterol, Total: 179 mg/dL (ref 100–199)
HDL: 33 mg/dL — ABNORMAL LOW (ref >39)
LDL Calculated: 112 mg/dL — ABNORMAL HIGH (ref 0–99)
Triglycerides: 170 mg/dL — ABNORMAL HIGH (ref 0–149)
VLDL CHOLESTEROL CAL: 34 mg/dL (ref 5–40)

## 2016-06-26 LAB — PSA: PROSTATE SPECIFIC AG, SERUM: 0.4 ng/mL (ref 0.0–4.0)

## 2016-06-26 LAB — PLEASE NOTE

## 2016-06-26 LAB — HEPATITIS C ANTIBODY: Hep C Virus Ab: <0.1 s/co ratio (ref 0.0–0.9)

## 2016-06-26 LAB — HEMOGLOBIN A1C
ESTIMATED AVERAGE GLUCOSE: 103 mg/dL
HEMOGLOBIN A1C: 5.2 % (ref 4.8–5.6)

## 2016-07-01 ENCOUNTER — Encounter: Payer: Self-pay | Admitting: Radiology

## 2016-07-28 ENCOUNTER — Encounter: Payer: Self-pay | Admitting: Emergency Medicine

## 2016-10-22 ENCOUNTER — Ambulatory Visit (INDEPENDENT_AMBULATORY_CARE_PROVIDER_SITE_OTHER): Payer: Self-pay | Admitting: Emergency Medicine

## 2016-10-22 ENCOUNTER — Encounter: Payer: Self-pay | Admitting: Emergency Medicine

## 2016-10-22 VITALS — BP 114/66 | HR 78 | Temp 99.1°F | Resp 16 | Ht 70.0 in | Wt 247.6 lb

## 2016-10-22 DIAGNOSIS — M352 Behcet's disease: Secondary | ICD-10-CM

## 2016-10-22 DIAGNOSIS — Z760 Encounter for issue of repeat prescription: Secondary | ICD-10-CM

## 2016-10-22 MED ORDER — DAPSONE 100 MG PO TABS: 100.0000 mg | ORAL_TABLET | Freq: Every day | ORAL | 3 refills | Status: AC

## 2016-10-22 NOTE — Progress Notes (Signed)
m °

## 2016-10-22 NOTE — Progress Notes (Signed)
Darius [name] 61 y.o.   Chief Complaint  Patient presents with  . Medication Refill    DAPSONE    HISTORY OF PRESENT ILLNESS: This is a 61 y.o. 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  HPI   Prior to Admission medications   Medication Sig Start Date End Date Taking? Authorizing Provider  chlorthalidone (HYGROTON) 25 MG tablet Take 1 tablet (25 mg total) by mouth daily. 06/25/16  Yes Tykira Wachs, Ines Bloomer, MD  dapsone 100 MG tablet Take 1 tablet (100 mg total) by mouth daily. 10/22/16 01/20/17 Yes Tabitha Riggins, Ines Bloomer, MD    No Known Allergies  Patient Active Problem List   Diagnosis Date Noted  . 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 HTN (hypertension) 08/06/2015    Past Medical History:  Diagnosis Date  . Hypertension     No past surgical history on file.  Social History   Social History  . Marital status: Married    Spouse name: N/A  . Number of children: N/A  . Years of education: N/A   Occupational History  . Not on file.   Social History Main Topics  . Smoking status: Current Every Day Smoker  . Smokeless tobacco: Never Used  . Alcohol use No  . Drug use: No  . Sexual activity: Not on file   Other Topics Concern  . Not on file   Social History Narrative  . No narrative on file    No family history on file.   Review of Systems  Constitutional: Negative.  Negative for chills and fever.  Respiratory: Negative for shortness of breath.   Gastrointestinal: Negative for nausea and vomiting.  Skin: Negative for rash.    Vitals:   10/22/16 0914  BP: 114/66  Pulse: 78  Resp: 16  Temp: 99.1 F (37.3 C)  SpO2: 94%    Physical Exam  Constitutional: He is oriented to person, place, and time. He appears well-developed and well-nourished.  HENT:  Head: Normocephalic and atraumatic.  Eyes: Pupils are equal, round, and reactive to light. EOM  are normal.  Neck: Normal range of motion.  Cardiovascular: Normal rate.   Pulmonary/Chest: Effort normal.  Musculoskeletal: Normal range of motion.  Neurological: He is alert and oriented to person, place, and time.  Skin: Skin is warm and dry.  Psychiatric: He has a normal mood and affect. His behavior is normal.     ASSESSMENT & PLAN: [name] was seen today for medication refill.  Diagnoses and all orders for this visit:  Behcet's disease ([place]) -     dapsone 100 MG tablet; Take 1 tablet (100 mg total) by mouth daily.  Encounter for medication refill   Patient Instructions       IF you received an x-ray today, you will receive an invoice from [facility]. Please contact [facility] at [phone] with questions or concerns regarding your invoice.   IF you received labwork today, you will receive an invoice from [facility]. Please contact [facility] at [phone] with questions or concerns regarding your invoice.   Our billing staff will not be able to assist you with questions regarding bills from these companies.  You will be contacted with the lab results as soon as they are available. The fastest way to get your results is to activate your My Chart account. Instructions are located on the last page of this paperwork.  If you have not heard from [place] regarding the results in 2 weeks, please contact this office.    Behcet Syndrome, Adult Behcet syndrome is a long-term (chronic) condition that causes swelling and irritation (inflammation) of blood vessels (vasculitis). This inflammation can lead to painful mouth ulcers (canker sores) and skin ulcers. Canker sores may appear anywhere in the mouth. Behcet syndrome may also affect other parts of the body, including the eyes, joints, and genitals. Behcet syndrome may be passed down to family members through an abnormal gene. This gene may cause your immune system to mistakenly attack blood vessels, causing  inflammation (autoimmune disorder). In other cases, the disorder may occur without a family history. What are the causes? The exact cause of this condition is not known. What increases the risk? This condition is more likely to occur in people who:  Have a parent who carries the gene that is associated with the condition. The gene is more common in people of [place] and Asian descent.  Are between 74 and 67 years old.  Are male.  What are the signs or symptoms? Symptoms of this condition may come and go and can range from mild to severe. Common symptoms include:  Canker sores.  Genital ulcers.  Skin ulcers.  Painful skin bumps or acne.  Eye inflammation, which can cause eye pain, redness, tearing, and blurred vision.  Joint pain and inflammation (arthritis).  Less common symptoms include:  Digestive system inflammation, which can cause nausea, belly pain, diarrhea, or bloody diarrhea.  Brain inflammation, which can cause headaches and clumsiness.  Large blood vessel inflammation, which can cause blood clots.  Lung or heart inflammation, which can cause breathing difficulty or chest pain.  How is this diagnosed? This condition is diagnosed based on your symptoms, medical history, and a physical exam. Your health care provider may also do a skin test to confirm the diagnosis. This involves pricking the skin with a needle to see if small red bumps form (pathergy test). How is this treated? There is no cure for this condition. Treatment depends on your symptoms. Treatment may include steroid medicines, such as:  Steroid creams or ointments for oral, genital, or other skin ulcers.  Numbing steroid mouthwash for painful mouth ulcers.  Steroid eye drops for eye symptoms.  Oral steroid medicine to treat a flare-up of symptoms.  Other medicines used to treat symptoms of this condition may include:  NSAIDs to relieve pain and swelling.  Medicines that suppress the  immune system response for severe symptoms (immunosuppressive medicines).  A medicine for severe symptoms that do not respond to other treatments (TNF inhibitor).  Follow these instructions at home:  Learn as much as you can about your condition.  Work closely with your team of health care providers.  Take or apply over-the-counter and prescription medicines only as told by your health care providers.  Rest at home during a flare-up of symptoms.  Return to your normal activities as told by your health care providers. Ask your health care providers what activities are safe.  Keep all follow-up visits as told by your health care provider. This is important. Where to find more information:  American Behcet's Disease Association: www.behcets.com Contact a health care provider if:  You have a fever.  You have a flare-up of Behcet symptoms.  You have side effects from medicines.  You develop any new symptoms of Behcet syndrome. Get help right away if:  You have a severe headache.  You become unusually  clumsy.  You have a sudden change in vision.  You have chest pain or difficulty breathing.  You vomit blood or have bloody stool. Summary  Behcet syndrome is a long-term (chronic) condition that causes swelling and irritation (inflammation) of blood vessels (vasculitis).  Treatment may include steroid medicines and other medicines to relieve pain and swelling.  During symptom flare-ups, rest at home and then return to your normal activities as told by your health care provider. This information is not intended to replace advice given to you by your health care provider. Make sure you discuss any questions you have with your health care provider. Document Released: 12/20/2001 Document Revised: 12/19/2015 Document Reviewed: 12/19/2015 Elsevier Interactive Patient Education  2018 Elsevier Inc.       Agustina Caroli, MD [facility]

## 2016-10-22 NOTE — Patient Instructions (Addendum)
IF you received an x-ray today, you will receive an invoice from [facility]. Please contact [facility] at [phone] with questions or concerns regarding your invoice.   IF you received labwork today, you will receive an invoice from [place] [facility]. Please contact [facility] at [phone] with questions or concerns regarding your invoice.   Our billing staff will not be able to assist you with questions regarding bills from these companies.  You will be contacted with the lab results as soon as they are available. The fastest way to get your results is to activate your My Chart account. Instructions are located on the last page of this paperwork. If you have not heard from [place] regarding the results in 2 weeks, please contact this office.    Behcet Syndrome, Adult Behcet syndrome is a long-term (chronic) condition that causes swelling and irritation (inflammation) of blood vessels (vasculitis). This inflammation can lead to painful mouth ulcers (canker sores) and skin ulcers. Canker sores may appear anywhere in the mouth. Behcet syndrome may also affect other parts of the body, including the eyes, joints, and genitals. Behcet syndrome may be passed down to family members through an abnormal gene. This gene may cause your immune system to mistakenly attack blood vessels, causing inflammation (autoimmune disorder). In other cases, the disorder may occur without a family history. What are the causes? The exact cause of this condition is not known. What increases the risk? This condition is more likely to occur in people who:  Have a parent who carries the gene that is associated with the condition. The gene is more common in people of [place] and Asian descent.  Are between 57 and 93 years old.  Are male.  What are the signs or symptoms? Symptoms of this condition may come and go and can range from mild to severe. Common symptoms include:  Canker sores.  Genital  ulcers.  Skin ulcers.  Painful skin bumps or acne.  Eye inflammation, which can cause eye pain, redness, tearing, and blurred vision.  Joint pain and inflammation (arthritis).  Less common symptoms include:  Digestive system inflammation, which can cause nausea, belly pain, diarrhea, or bloody diarrhea.  Brain inflammation, which can cause headaches and clumsiness.  Large blood vessel inflammation, which can cause blood clots.  Lung or heart inflammation, which can cause breathing difficulty or chest pain.  How is this diagnosed? This condition is diagnosed based on your symptoms, medical history, and a physical exam. Your health care provider may also do a skin test to confirm the diagnosis. This involves pricking the skin with a needle to see if small red bumps form (pathergy test). How is this treated? There is no cure for this condition. Treatment depends on your symptoms. Treatment may include steroid medicines, such as:  Steroid creams or ointments for oral, genital, or other skin ulcers.  Numbing steroid mouthwash for painful mouth ulcers.  Steroid eye drops for eye symptoms.  Oral steroid medicine to treat a flare-up of symptoms.  Other medicines used to treat symptoms of this condition may include:  NSAIDs to relieve pain and swelling.  Medicines that suppress the immune system response for severe symptoms (immunosuppressive medicines).  A medicine for severe symptoms that do not respond to other treatments (TNF inhibitor).  Follow these instructions at home:  Learn as much as you can about your condition.  Work closely with your team of health care providers.  Take or apply over-the-counter and prescription medicines only as  told by your health care providers.  Rest at home during a flare-up of symptoms.  Return to your normal activities as told by your health care providers. Ask your health care providers what activities are safe.  Keep all follow-up  visits as told by your health care provider. This is important. Where to find more information:  American Behcet's Disease Association: www.behcets.com Contact a health care provider if:  You have a fever.  You have a flare-up of Behcet symptoms.  You have side effects from medicines.  You develop any new symptoms of Behcet syndrome. Get help right away if:  You have a severe headache.  You become unusually clumsy.  You have a sudden change in vision.  You have chest pain or difficulty breathing.  You vomit blood or have bloody stool. Summary  Behcet syndrome is a long-term (chronic) condition that causes swelling and irritation (inflammation) of blood vessels (vasculitis).  Treatment may include steroid medicines and other medicines to relieve pain and swelling.  During symptom flare-ups, rest at home and then return to your normal activities as told by your health care provider. This information is not intended to replace advice given to you by your health care provider. Make sure you discuss any questions you have with your health care provider. Document Released: 12/20/2001 Document Revised: 12/19/2015 Document Reviewed: 12/19/2015 Elsevier Interactive Patient Education  [employer].

## 2016-12-26 ENCOUNTER — Ambulatory Visit: Payer: Self-pay | Admitting: Emergency Medicine

## 2017-01-13 DIAGNOSIS — I2510 Atherosclerotic heart disease of native coronary artery without angina pectoris: Secondary | ICD-10-CM

## 2017-01-13 DIAGNOSIS — I219 Acute myocardial infarction, unspecified: Secondary | ICD-10-CM

## 2017-01-13 HISTORY — DX: Acute myocardial infarction, unspecified: I21.9

## 2017-01-13 HISTORY — DX: Atherosclerotic heart disease of native coronary artery without angina pectoris: I25.10

## 2017-06-01 ENCOUNTER — Other Ambulatory Visit: Payer: Self-pay | Admitting: Emergency Medicine

## 2017-07-21 ENCOUNTER — Ambulatory Visit: Payer: Self-pay | Admitting: Emergency Medicine

## 2017-07-21 ENCOUNTER — Other Ambulatory Visit: Payer: Self-pay

## 2017-07-21 ENCOUNTER — Encounter: Payer: Self-pay | Admitting: Emergency Medicine

## 2017-07-21 VITALS — BP 128/76 | HR 94 | Temp 98.3°F | Resp 16 | Ht 69.25 in | Wt 239.2 lb

## 2017-07-21 DIAGNOSIS — Z760 Encounter for issue of repeat prescription: Secondary | ICD-10-CM

## 2017-07-21 DIAGNOSIS — I10 Essential (primary) hypertension: Secondary | ICD-10-CM

## 2017-07-21 MED ORDER — CHLORTHALIDONE 25 MG PO TABS: 25.0000 mg | ORAL_TABLET | Freq: Every day | ORAL | 3 refills | Status: DC

## 2017-07-21 NOTE — Progress Notes (Signed)
Darius [name] 62 y.o.   Chief Complaint  Patient presents with  . Medication Refill    Chlorthalidone    HISTORY OF PRESENT ILLNESS: This is a 62 y.o. male with history of hypertension needs medication refill.  On chlorthalidone.  Doing well.  No complaints.  HPI   Prior to Admission medications   Medication Sig Start Date End Date Taking? Authorizing Provider  chlorthalidone (HYGROTON) 25 MG tablet Take 1 tablet (25 mg total) by mouth daily. 06/25/16  Yes SagardiaInes Bloomer, MD    No Known Allergies  Patient Active Problem List   Diagnosis Date Noted  . 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 HTN (hypertension) 08/06/2015    Past Medical History:  Diagnosis Date  . Hypertension     No past surgical history on file.  Social History   Socioeconomic History  . Marital status: Married    Spouse name: Not on file  . Number of children: Not on file  . Years of education: Not on file  . Highest education level: Not on file  Occupational History  . Not on file  Social Needs  . Financial resource strain: Not on file  . Food insecurity:    Worry: Not on file    Inability: Not on file  . Transportation needs:    Medical: Not on file    Non-medical: Not on file  Tobacco Use  . Smoking status: Current Every Day Smoker  . Smokeless tobacco: Never Used  Substance and Sexual Activity  . Alcohol use: No  . Drug use: No  . Sexual activity: Not on file  Lifestyle  . Physical activity:    Days per week: Not on file    Minutes per session: Not on file  . Stress: Not on file  Relationships  . Social connections:    Talks on phone: Not on file    Gets together: Not on file    Attends religious service: Not on file    Active member of club or organization: Not on file    Attends meetings of clubs or organizations: Not on file    Relationship status: Not on file  . Intimate partner violence:    Fear of current or ex partner: Not on file    Emotionally abused: Not  on file    Physically abused: Not on file    Forced sexual activity: Not on file  Other Topics Concern  . Not on file  Social History Narrative  . Not on file    No family history on file.   Review of Systems  Constitutional: Negative.  Negative for chills, fever and weight loss.  HENT: Negative.   Eyes: Negative.  Negative for blurred vision and double vision.  Respiratory: Negative.  Negative for cough and shortness of breath.   Cardiovascular: Negative.  Negative for chest pain and palpitations.  Gastrointestinal: Negative.  Negative for abdominal pain, diarrhea, nausea and vomiting.  Genitourinary: Negative.  Negative for dysuria and hematuria.  Musculoskeletal: Negative for back pain, myalgias and neck pain.  Skin: Negative.   Neurological: Negative.  Negative for dizziness and headaches.  Endo/Heme/Allergies: Negative.   All other systems reviewed and are negative.   Vitals:   07/21/17 1407  BP: 128/76  Pulse: 94  Resp: 16  Temp: 98.3 F (36.8 C)  SpO2: 95%    Physical Exam  Constitutional: He is oriented to person, place, and time. He appears well-developed and well-nourished.  HENT:  Head: Normocephalic  and atraumatic.  Nose: Nose normal.  Mouth/Throat: Oropharynx is clear and moist.  Eyes: Pupils are equal, round, and reactive to light. Conjunctivae and EOM are normal.  Neck: Normal range of motion. Neck supple. No JVD present.  Cardiovascular: Normal rate, regular rhythm and normal heart sounds.  Pulmonary/Chest: Effort normal and breath sounds normal.  Abdominal: Soft. There is no tenderness.  Musculoskeletal: Normal range of motion. He exhibits no edema.  Lymphadenopathy:    He has no cervical adenopathy.  Neurological: He is alert and oriented to person, place, and time. No sensory deficit. He exhibits normal muscle tone.  Skin: Skin is warm and dry. Capillary refill takes less than 2 seconds.  Psychiatric: He has a normal mood and affect. His  behavior is normal.  Vitals reviewed.    ASSESSMENT & PLAN: Rhylan was seen today for medication refill.  Diagnoses and all orders for this visit:  Essential hypertension -     chlorthalidone (HYGROTON) 25 MG tablet; Take 1 tablet (25 mg total) by mouth daily.  Encounter for medication refill    Patient Instructions       IF you received an x-ray today, you will receive an invoice from [facility]. Please contact [facility] at [phone] with questions or concerns regarding your invoice.   IF you received labwork today, you will receive an invoice from [facility]. Please contact [facility] at [phone] with questions or concerns regarding your invoice.   Our billing staff will not be able to assist you with questions regarding bills from these companies.  You will be contacted with the lab results as soon as they are available. The fastest way to get your results is to activate your My Chart account. Instructions are located on the last page of this paperwork. If you have not heard from [place] regarding the results in 2 weeks, please contact this office.     Hypertension Hypertension, commonly called high blood pressure, is when the force of blood pumping through the arteries is too strong. The arteries are the blood vessels that carry blood from the heart throughout the body. Hypertension forces the heart to work harder to pump blood and may cause arteries to become narrow or stiff. Having untreated or uncontrolled hypertension can cause heart attacks, strokes, kidney disease, and other problems. A blood pressure reading consists of a higher number over a lower number. Ideally, your blood pressure should be below 120/80. The first ("top") number is called the systolic pressure. It is a measure of the pressure in your arteries as your heart beats. The second ("bottom") number is called the diastolic pressure. It is a measure of the pressure in your arteries as the  heart relaxes. What are the causes? The cause of this condition is not known. What increases the risk? Some risk factors for high blood pressure are under your control. Others are not. Factors you can change  Smoking.  Having type 2 diabetes mellitus, high cholesterol, or both.  Not getting enough exercise or physical activity.  Being overweight.  Having too much fat, sugar, calories, or salt (sodium) in your diet.  Drinking too much alcohol. Factors that are difficult or impossible to change  Having chronic kidney disease.  Having a family history of high blood pressure.  Age. Risk increases with age.  Race. You may be at higher risk if you are African-American.  Gender. Men are at higher risk than women before age 31. After age 35, women are at higher risk than men.  Having obstructive sleep apnea.  Stress. What are the signs or symptoms? Extremely high blood pressure (hypertensive crisis) may cause:  Headache.  Anxiety.  Shortness of breath.  Nosebleed.  Nausea and vomiting.  Severe chest pain.  Jerky movements you cannot control (seizures).  How is this diagnosed? This condition is diagnosed by measuring your blood pressure while you are seated, with your arm resting on a surface. The cuff of the blood pressure monitor will be placed directly against the skin of your upper arm at the level of your heart. It should be measured at least twice using the same arm. Certain conditions can cause a difference in blood pressure between your right and left arms. Certain factors can cause blood pressure readings to be lower or higher than normal (elevated) for a short period of time:  When your blood pressure is higher when you are in a health care provider's office than when you are at home, this is called white coat hypertension. Most people with this condition do not need medicines.  When your blood pressure is higher at home than when you are in a health care  provider's office, this is called masked hypertension. Most people with this condition may need medicines to control blood pressure.  If you have a high blood pressure reading during one visit or you have normal blood pressure with other risk factors:  You may be asked to return on a different day to have your blood pressure checked again.  You may be asked to monitor your blood pressure at home for 1 week or longer.  If you are diagnosed with hypertension, you may have other blood or imaging tests to help your health care provider understand your overall risk for other conditions. How is this treated? This condition is treated by making healthy lifestyle changes, such as eating healthy foods, exercising more, and reducing your alcohol intake. Your health care provider may prescribe medicine if lifestyle changes are not enough to get your blood pressure under control, and if:  Your systolic blood pressure is above 130.  Your diastolic blood pressure is above 80.  Your personal target blood pressure may vary depending on your medical conditions, your age, and other factors. Follow these instructions at home: Eating and drinking  Eat a diet that is high in fiber and potassium, and low in sodium, added sugar, and fat. An example eating plan is called the DASH (Dietary Approaches to Stop Hypertension) diet. To eat this way: ? Eat plenty of fresh fruits and vegetables. Try to fill half of your plate at each meal with fruits and vegetables. ? Eat whole grains, such as whole wheat pasta, brown rice, or whole grain bread. Fill about one quarter of your plate with whole grains. ? Eat or drink low-fat dairy products, such as skim milk or low-fat yogurt. ? Avoid fatty cuts of meat, processed or cured meats, and poultry with skin. Fill about one quarter of your plate with lean proteins, such as fish, chicken without skin, beans, eggs, and tofu. ? Avoid premade and processed foods. These tend to be  higher in sodium, added sugar, and fat.  Reduce your daily sodium intake. Most people with hypertension should eat less than 1,500 mg of sodium a day.  Limit alcohol intake to no more than 1 drink a day for nonpregnant women and 2 drinks a day for men. One drink equals 12 oz of beer, 5 oz of wine, or 1 oz of hard liquor. Lifestyle  Work with your health care provider to maintain a healthy body weight or to lose weight. Ask what an ideal weight is for you.  Get at least 30 minutes of exercise that causes your heart to beat faster (aerobic exercise) most days of the week. Activities may include walking, swimming, or biking.  Include exercise to strengthen your muscles (resistance exercise), such as pilates or lifting weights, as part of your weekly exercise routine. Try to do these types of exercises for 30 minutes at least 3 days a week.  Do not use any products that contain nicotine or tobacco, such as cigarettes and e-cigarettes. If you need help quitting, ask your health care provider.  Monitor your blood pressure at home as told by your health care provider.  Keep all follow-up visits as told by your health care provider. This is important. Medicines  Take over-the-counter and prescription medicines only as told by your health care provider. Follow directions carefully. Blood pressure medicines must be taken as prescribed.  Do not skip doses of blood pressure medicine. Doing this puts you at risk for problems and can make the medicine less effective.  Ask your health care provider about side effects or reactions to medicines that you should watch for. Contact a health care provider if:  You think you are having a reaction to a medicine you are taking.  You have headaches that keep coming back (recurring).  You feel dizzy.  You have swelling in your ankles.  You have trouble with your vision. Get help right away if:  You develop a severe headache or confusion.  You have  unusual weakness or numbness.  You feel faint.  You have severe pain in your chest or abdomen.  You vomit repeatedly.  You have trouble breathing. Summary  Hypertension is when the force of blood pumping through your arteries is too strong. If this condition is not controlled, it may put you at risk for serious complications.  Your personal target blood pressure may vary depending on your medical conditions, your age, and other factors. For most people, a normal blood pressure is less than 120/80.  Hypertension is treated with lifestyle changes, medicines, or a combination of both. Lifestyle changes include weight loss, eating a healthy, low-sodium diet, exercising more, and limiting alcohol. This information is not intended to replace advice given to you by your health care provider. Make sure you discuss any questions you have with your health care provider. Document Released: 12/30/2004 Document Revised: 11/28/2015 Document Reviewed: 11/28/2015 Elsevier Interactive Patient Education  2018 Elsevier Inc.      Agustina Caroli, MD [facility]

## 2017-07-21 NOTE — Patient Instructions (Addendum)
   IF you received an x-ray today, you will receive an invoice from [facility]. Please contact [facility] at [phone] with questions or concerns regarding your invoice.   IF you received labwork today, you will receive an invoice from [facility]. Please contact [facility] at [phone] with questions or concerns regarding your invoice.   Our billing staff will not be able to assist you with questions regarding bills from these companies.  You will be contacted with the lab results as soon as they are available. The fastest way to get your results is to activate your My Chart account. Instructions are located on the last page of this paperwork. If you have not heard from us regarding the results in 2 weeks, please contact this office.     Hypertension Hypertension, commonly called high blood pressure, is when the force of blood pumping through the arteries is too strong. The arteries are the blood vessels that carry blood from the heart throughout the body. Hypertension forces the heart to work harder to pump blood and may cause arteries to become narrow or stiff. Having untreated or uncontrolled hypertension can cause heart attacks, strokes, kidney disease, and other problems. A blood pressure reading consists of a higher number over a lower number. Ideally, your blood pressure should be below 120/80. The first ("top") number is called the systolic pressure. It is a measure of the pressure in your arteries as your heart beats. The second ("bottom") number is called the diastolic pressure. It is a measure of the pressure in your arteries as the heart relaxes. What are the causes? The cause of this condition is not known. What increases the risk? Some risk factors for high blood pressure are under your control. Others are not. Factors you can change  Smoking.  Having type 2 diabetes mellitus, high cholesterol, or both.  Not getting enough exercise or physical  activity.  Being overweight.  Having too much fat, sugar, calories, or salt (sodium) in your diet.  Drinking too much alcohol. Factors that are difficult or impossible to change  Having chronic kidney disease.  Having a family history of high blood pressure.  Age. Risk increases with age.  Race. You may be at higher risk if you are African-American.  Gender. Men are at higher risk than women before age 45. After age 65, women are at higher risk than men.  Having obstructive sleep apnea.  Stress. What are the signs or symptoms? Extremely high blood pressure (hypertensive crisis) may cause:  Headache.  Anxiety.  Shortness of breath.  Nosebleed.  Nausea and vomiting.  Severe chest pain.  Jerky movements you cannot control (seizures).  How is this diagnosed? This condition is diagnosed by measuring your blood pressure while you are seated, with your arm resting on a surface. The cuff of the blood pressure monitor will be placed directly against the skin of your upper arm at the level of your heart. It should be measured at least twice using the same arm. Certain conditions can cause a difference in blood pressure between your right and left arms. Certain factors can cause blood pressure readings to be lower or higher than normal (elevated) for a short period of time:  When your blood pressure is higher when you are in a health care provider's office than when you are at home, this is called white coat hypertension. Most people with this condition do not need medicines.  When your blood pressure is higher at home than when you   are in a health care provider's office, this is called masked hypertension. Most people with this condition may need medicines to control blood pressure.  If you have a high blood pressure reading during one visit or you have normal blood pressure with other risk factors:  You may be asked to return on a different day to have your blood pressure  checked again.  You may be asked to monitor your blood pressure at home for 1 week or longer.  If you are diagnosed with hypertension, you may have other blood or imaging tests to help your health care provider understand your overall risk for other conditions. How is this treated? This condition is treated by making healthy lifestyle changes, such as eating healthy foods, exercising more, and reducing your alcohol intake. Your health care provider may prescribe medicine if lifestyle changes are not enough to get your blood pressure under control, and if:  Your systolic blood pressure is above 130.  Your diastolic blood pressure is above 80.  Your personal target blood pressure may vary depending on your medical conditions, your age, and other factors. Follow these instructions at home: Eating and drinking  Eat a diet that is high in fiber and potassium, and low in sodium, added sugar, and fat. An example eating plan is called the DASH (Dietary Approaches to Stop Hypertension) diet. To eat this way: ? Eat plenty of fresh fruits and vegetables. Try to fill half of your plate at each meal with fruits and vegetables. ? Eat whole grains, such as whole wheat pasta, brown rice, or whole grain bread. Fill about one quarter of your plate with whole grains. ? Eat or drink low-fat dairy products, such as skim milk or low-fat yogurt. ? Avoid fatty cuts of meat, processed or cured meats, and poultry with skin. Fill about one quarter of your plate with lean proteins, such as fish, chicken without skin, beans, eggs, and tofu. ? Avoid premade and processed foods. These tend to be higher in sodium, added sugar, and fat.  Reduce your daily sodium intake. Most people with hypertension should eat less than 1,500 mg of sodium a day.  Limit alcohol intake to no more than 1 drink a day for nonpregnant women and 2 drinks a day for men. One drink equals 12 oz of beer, 5 oz of wine, or 1 oz of hard  liquor. Lifestyle  Work with your health care provider to maintain a healthy body weight or to lose weight. Ask what an ideal weight is for you.  Get at least 30 minutes of exercise that causes your heart to beat faster (aerobic exercise) most days of the week. Activities may include walking, swimming, or biking.  Include exercise to strengthen your muscles (resistance exercise), such as pilates or lifting weights, as part of your weekly exercise routine. Try to do these types of exercises for 30 minutes at least 3 days a week.  Do not use any products that contain nicotine or tobacco, such as cigarettes and e-cigarettes. If you need help quitting, ask your health care provider.  Monitor your blood pressure at home as told by your health care provider.  Keep all follow-up visits as told by your health care provider. This is important. Medicines  Take over-the-counter and prescription medicines only as told by your health care provider. Follow directions carefully. Blood pressure medicines must be taken as prescribed.  Do not skip doses of blood pressure medicine. Doing this puts you at risk for problems and   can make the medicine less effective.  Ask your health care provider about side effects or reactions to medicines that you should watch for. Contact a health care provider if:  You think you are having a reaction to a medicine you are taking.  You have headaches that keep coming back (recurring).  You feel dizzy.  You have swelling in your ankles.  You have trouble with your vision. Get help right away if:  You develop a severe headache or confusion.  You have unusual weakness or numbness.  You feel faint.  You have severe pain in your chest or abdomen.  You vomit repeatedly.  You have trouble breathing. Summary  Hypertension is when the force of blood pumping through your arteries is too strong. If this condition is not controlled, it may put you at risk for serious  complications.  Your personal target blood pressure may vary depending on your medical conditions, your age, and other factors. For most people, a normal blood pressure is less than 120/80.  Hypertension is treated with lifestyle changes, medicines, or a combination of both. Lifestyle changes include weight loss, eating a healthy, low-sodium diet, exercising more, and limiting alcohol. This information is not intended to replace advice given to you by your health care provider. Make sure you discuss any questions you have with your health care provider. Document Released: 12/30/2004 Document Revised: 11/28/2015 Document Reviewed: 11/28/2015 Elsevier Interactive Patient Education  2018 Elsevier Inc.  

## 2017-11-30 ENCOUNTER — Encounter (HOSPITAL_COMMUNITY)
Admission: EM | Disposition: A | Discharge status: Home / Self Care | Payer: Self-pay | Source: Home / Self Care | Attending: Interventional Cardiology

## 2017-11-30 ENCOUNTER — Inpatient Hospital Stay (HOSPITAL_COMMUNITY): Payer: Medicaid Other

## 2017-11-30 ENCOUNTER — Emergency Department (HOSPITAL_COMMUNITY): Payer: Medicaid Other

## 2017-11-30 ENCOUNTER — Inpatient Hospital Stay (HOSPITAL_COMMUNITY)
Admission: EM | Admit: 2017-11-30 | Discharge: 2017-12-03 | DRG: 247 | Disposition: A | Discharge status: Home / Self Care | Payer: Medicaid Other | Attending: Interventional Cardiology | Admitting: Interventional Cardiology

## 2017-11-30 ENCOUNTER — Encounter (HOSPITAL_COMMUNITY): Payer: Self-pay | Admitting: *Deleted

## 2017-11-30 ENCOUNTER — Other Ambulatory Visit: Payer: Self-pay

## 2017-11-30 DIAGNOSIS — I2111 ST elevation (STEMI) myocardial infarction involving right coronary artery: Secondary | ICD-10-CM

## 2017-11-30 DIAGNOSIS — M352 Behcet's disease: Secondary | ICD-10-CM | POA: Diagnosis present

## 2017-11-30 DIAGNOSIS — Z955 Presence of coronary angioplasty implant and graft: Secondary | ICD-10-CM

## 2017-11-30 DIAGNOSIS — I10 Essential (primary) hypertension: Secondary | ICD-10-CM | POA: Diagnosis present

## 2017-11-30 DIAGNOSIS — N329 Bladder disorder, unspecified: Secondary | ICD-10-CM | POA: Diagnosis present

## 2017-11-30 DIAGNOSIS — I2510 Atherosclerotic heart disease of native coronary artery without angina pectoris: Secondary | ICD-10-CM | POA: Insufficient documentation

## 2017-11-30 DIAGNOSIS — I213 ST elevation (STEMI) myocardial infarction of unspecified site: Secondary | ICD-10-CM

## 2017-11-30 DIAGNOSIS — F17200 Nicotine dependence, unspecified, uncomplicated: Secondary | ICD-10-CM | POA: Diagnosis present

## 2017-11-30 DIAGNOSIS — I2119 ST elevation (STEMI) myocardial infarction involving other coronary artery of inferior wall: Secondary | ICD-10-CM | POA: Diagnosis present

## 2017-11-30 DIAGNOSIS — Z79899 Other long term (current) drug therapy: Secondary | ICD-10-CM | POA: Diagnosis not present

## 2017-11-30 DIAGNOSIS — R0789 Other chest pain: Secondary | ICD-10-CM | POA: Diagnosis present

## 2017-11-30 DIAGNOSIS — R935 Abnormal findings on diagnostic imaging of other abdominal regions, including retroperitoneum: Secondary | ICD-10-CM | POA: Diagnosis not present

## 2017-11-30 DIAGNOSIS — R310 Gross hematuria: Secondary | ICD-10-CM | POA: Diagnosis present

## 2017-11-30 DIAGNOSIS — I252 Old myocardial infarction: Secondary | ICD-10-CM

## 2017-11-30 DIAGNOSIS — I25110 Atherosclerotic heart disease of native coronary artery with unstable angina pectoris: Secondary | ICD-10-CM | POA: Diagnosis not present

## 2017-11-30 DIAGNOSIS — E785 Hyperlipidemia, unspecified: Secondary | ICD-10-CM | POA: Diagnosis present

## 2017-11-30 HISTORY — PX: LEFT HEART CATH AND CORONARY ANGIOGRAPHY: CATH118249

## 2017-11-30 HISTORY — DX: Presence of coronary angioplasty implant and graft: Z95.5

## 2017-11-30 HISTORY — DX: Old myocardial infarction: I25.2

## 2017-11-30 HISTORY — PX: CORONARY/GRAFT ACUTE MI REVASCULARIZATION: CATH118305

## 2017-11-30 LAB — CBC
HCT: 43.7 % (ref 39.0–52.0)
HEMATOCRIT: 47.1 % (ref 39.0–52.0)
Hemoglobin: 15.2 g/dL (ref 13.0–17.0)
Hemoglobin: 14.1 g/dL (ref 13.0–17.0)
MCH: 30.4 pg (ref 26.0–34.0)
MCH: 30.3 pg (ref 26.0–34.0)
MCHC: 32.3 g/dL (ref 30.0–36.0)
MCHC: 32.3 g/dL (ref 30.0–36.0)
MCV: 94.2 fL (ref 80.0–100.0)
MCV: 94 fL (ref 80.0–100.0)
Platelets: 312 10*3/uL (ref 150–400)
Platelets: 259 10*3/uL (ref 150–400)
RBC: 5 MIL/uL (ref 4.22–5.81)
RBC: 4.65 MIL/uL (ref 4.22–5.81)
RDW: 13.4 % (ref 11.5–15.5)
RDW: 13.7 % (ref 11.5–15.5)
WBC: 20.3 10*3/uL — ABNORMAL HIGH (ref 4.0–10.5)
WBC: 14.8 10*3/uL — AB (ref 4.0–10.5)
nRBC: 0 % (ref 0.0–0.2)
nRBC: 0 % (ref 0.0–0.2)

## 2017-11-30 LAB — I-STAT CHEM 8, ED
BUN: 17 mg/dL (ref 6–20)
CALCIUM ION: 1.08 mmol/L — AB (ref 1.15–1.40)
CHLORIDE: 104 mmol/L (ref 98–111)
CREATININE: 0.6 mg/dL — AB (ref 0.61–1.24)
GLUCOSE: 179 mg/dL — AB (ref 70–99)
HCT: 48 % (ref 39.0–52.0)
Hemoglobin: 16.3 g/dL (ref 13.0–17.0)
POTASSIUM: 3.2 mmol/L — AB (ref 3.5–5.1)
Sodium: 139 mmol/L (ref 135–145)
TCO2: 25 mmol/L (ref 22–32)

## 2017-11-30 LAB — CBC WITH DIFFERENTIAL/PLATELET
BAND NEUTROPHILS: 0 %
BASOS ABS: 0.2 10*3/uL — AB (ref 0.0–0.1)
BLASTS: 0 %
Basophils Relative: 1 %
EOS ABS: 0.3 10*3/uL (ref 0.0–0.5)
Eosinophils Relative: 2 %
HCT: 49.7 % (ref 39.0–52.0)
HEMOGLOBIN: 15.7 g/dL (ref 13.0–17.0)
Lymphocytes Relative: 49 %
Lymphs Abs: 8.3 10*3/uL — ABNORMAL HIGH (ref 0.7–4.0)
MCH: 29.8 pg (ref 26.0–34.0)
MCHC: 31.6 g/dL (ref 30.0–36.0)
MCV: 94.5 fL (ref 80.0–100.0)
METAMYELOCYTES PCT: 0 %
MONOS PCT: 7 %
Monocytes Absolute: 1.2 10*3/uL — ABNORMAL HIGH (ref 0.1–1.0)
Myelocytes: 0 %
NRBC: 0 % (ref 0.0–0.2)
Neutro Abs: 6.9 10*3/uL (ref 1.7–7.7)
Neutrophils Relative %: 41 %
Other: 0 %
PROMYELOCYTES RELATIVE: 0 %
Platelets: 326 10*3/uL (ref 150–400)
RBC: 5.26 MIL/uL (ref 4.22–5.81)
RDW: 13.3 % (ref 11.5–15.5)
WBC: 16.9 10*3/uL — AB (ref 4.0–10.5)
nRBC: 0 /100 WBC

## 2017-11-30 LAB — LIPID PANEL
CHOL/HDL RATIO: 5.8 ratio
CHOLESTEROL: 196 mg/dL (ref 0–200)
Cholesterol: 179 mg/dL (ref 0–200)
HDL: 34 mg/dL — AB (ref >40)
HDL: 36 mg/dL — ABNORMAL LOW (ref >40)
LDL CALC: 128 mg/dL — AB (ref 0–99)
LDL Cholesterol: 117 mg/dL — ABNORMAL HIGH (ref 0–99)
TRIGLYCERIDES: 74 mg/dL (ref <150)
Total CHOL/HDL Ratio: 5 RATIO
Triglycerides: 227 mg/dL — ABNORMAL HIGH (ref <150)
VLDL: 15 mg/dL (ref 0–40)
VLDL: 45 mg/dL — AB (ref 0–40)

## 2017-11-30 LAB — POCT ACTIVATED CLOTTING TIME
Activated Clotting Time: 340 seconds
Activated Clotting Time: 340 seconds

## 2017-11-30 LAB — COMPREHENSIVE METABOLIC PANEL
ALT: UNDETERMINED U/L (ref 0–44)
AST: 23 U/L (ref 15–41)
Albumin: 4 g/dL (ref 3.5–5.0)
Alkaline Phosphatase: 67 U/L (ref 38–126)
Anion gap: 14 (ref 5–15)
BUN: 15 mg/dL (ref 6–20)
CHLORIDE: 101 mmol/L (ref 98–111)
CO2: 17 mmol/L — AB (ref 22–32)
CREATININE: 0.69 mg/dL (ref 0.61–1.24)
Calcium: 9 mg/dL (ref 8.9–10.3)
GFR calc Af Amer: >60 mL/min (ref >60)
GFR calc non Af Amer: >60 mL/min (ref >60)
Glucose, Bld: 177 mg/dL — ABNORMAL HIGH (ref 70–99)
Potassium: 3 mmol/L — ABNORMAL LOW (ref 3.5–5.1)
SODIUM: 132 mmol/L — AB (ref 135–145)
Total Bilirubin: 0.8 mg/dL (ref 0.3–1.2)
Total Protein: 7.7 g/dL (ref 6.5–8.1)

## 2017-11-30 LAB — TROPONIN I
TROPONIN I: 2.2 ng/mL — AB (ref <0.03)
TROPONIN I: 10.37 ng/mL — AB (ref <0.03)
TROPONIN I: 27.55 ng/mL — AB (ref <0.03)
Troponin I: 22.82 ng/mL (ref <0.03)

## 2017-11-30 LAB — PROTIME-INR
INR: 1.05
INR: 1.05
PROTHROMBIN TIME: 13.6 s (ref 11.4–15.2)
Prothrombin Time: 13.6 seconds (ref 11.4–15.2)

## 2017-11-30 LAB — CREATININE, SERUM
Creatinine, Ser: 0.94 mg/dL (ref 0.61–1.24)
GFR calc Af Amer: >60 mL/min (ref >60)
GFR calc non Af Amer: >60 mL/min (ref >60)

## 2017-11-30 LAB — ECHOCARDIOGRAM COMPLETE
Height: 69 in
Weight: 3915.37 oz

## 2017-11-30 LAB — BASIC METABOLIC PANEL
Anion gap: 13 (ref 5–15)
BUN: 18 mg/dL (ref 6–20)
CALCIUM: 8.9 mg/dL (ref 8.9–10.3)
CO2: 19 mmol/L — AB (ref 22–32)
CREATININE: 0.86 mg/dL (ref 0.61–1.24)
Chloride: 105 mmol/L (ref 98–111)
GFR calc non Af Amer: >60 mL/min (ref >60)
Glucose, Bld: 172 mg/dL — ABNORMAL HIGH (ref 70–99)
Potassium: 4.3 mmol/L (ref 3.5–5.1)
Sodium: 137 mmol/L (ref 135–145)

## 2017-11-30 LAB — HEMOGLOBIN A1C
Hgb A1c MFr Bld: 6.5 % — ABNORMAL HIGH (ref 4.8–5.6)
MEAN PLASMA GLUCOSE: 139.85 mg/dL

## 2017-11-30 LAB — I-STAT TROPONIN, ED: Troponin i, poc: 0 ng/mL (ref 0.00–0.08)

## 2017-11-30 LAB — TSH: TSH: 1.044 u[IU]/mL (ref 0.350–4.500)

## 2017-11-30 LAB — MRSA PCR SCREENING: MRSA by PCR: NEGATIVE

## 2017-11-30 LAB — APTT: APTT: 29 s (ref 24–36)

## 2017-11-30 SURGERY — CORONARY/GRAFT ACUTE MI REVASCULARIZATION
Anesthesia: LOCAL

## 2017-11-30 MED ORDER — VERAPAMIL HCL 2.5 MG/ML IV SOLN
INTRAVENOUS | Status: DC | PRN
  Administered 2017-11-30: 10 mL via INTRA_ARTERIAL

## 2017-11-30 MED ORDER — METOPROLOL TARTRATE 12.5 MG HALF TABLET: 12.5000 mg | ORAL_TABLET | Freq: Two times a day (BID) | ORAL | Status: DC

## 2017-11-30 MED ORDER — ONDANSETRON HCL 4 MG/2ML IJ SOLN: 4.0000 mg | Freq: Four times a day (QID) | INTRAMUSCULAR | Status: DC | PRN

## 2017-11-30 MED ORDER — ATROPINE SULFATE 1 MG/10ML IJ SOSY
PREFILLED_SYRINGE | INTRAMUSCULAR | Status: AC
  Filled 2017-11-30: qty 10

## 2017-11-30 MED ORDER — METOPROLOL TARTRATE 12.5 MG HALF TABLET
12.5000 mg | ORAL_TABLET | Freq: Two times a day (BID) | ORAL | Status: DC
  Administered 2017-11-30: 12.5 mg via ORAL
  Filled 2017-11-30: qty 1

## 2017-11-30 MED ORDER — ONDANSETRON HCL 4 MG/2ML IJ SOLN
INTRAMUSCULAR | Status: DC | PRN
  Administered 2017-11-30: 4 mg via INTRAVENOUS

## 2017-11-30 MED ORDER — ASPIRIN 300 MG RE SUPP
300.0000 mg | RECTAL | Status: AC
  Filled 2017-11-30: qty 1

## 2017-11-30 MED ORDER — NITROGLYCERIN 1 MG/10 ML FOR IR/CATH LAB
INTRA_ARTERIAL | Status: DC | PRN
  Administered 2017-11-30: 100 ug via INTRACORONARY

## 2017-11-30 MED ORDER — SODIUM CHLORIDE 0.9% FLUSH
3.0000 mL | Freq: Two times a day (BID) | INTRAVENOUS | Status: DC
  Administered 2017-11-30 – 2017-12-03 (×7): 3 mL via INTRAVENOUS

## 2017-11-30 MED ORDER — SODIUM CHLORIDE 0.9 % IV SOLN
INTRAVENOUS | Status: AC
  Administered 2017-11-30: 03:00:00 via INTRAVENOUS

## 2017-11-30 MED ORDER — NITROGLYCERIN 1 MG/10 ML FOR IR/CATH LAB
INTRA_ARTERIAL | Status: AC
  Filled 2017-11-30: qty 10

## 2017-11-30 MED ORDER — HEPARIN SODIUM (PORCINE) 5000 UNIT/ML IJ SOLN
4000.0000 [IU] | Freq: Once | INTRAMUSCULAR | Status: AC
  Administered 2017-11-30: 4000 [IU] via INTRAVENOUS

## 2017-11-30 MED ORDER — TICAGRELOR 90 MG PO TABS
ORAL_TABLET | ORAL | Status: DC | PRN
  Administered 2017-11-30: 180 mg via ORAL

## 2017-11-30 MED ORDER — NITROGLYCERIN 0.4 MG SL SUBL: 0.4000 mg | SUBLINGUAL_TABLET | SUBLINGUAL | Status: DC | PRN

## 2017-11-30 MED ORDER — ACETAMINOPHEN 325 MG PO TABS
650.0000 mg | ORAL_TABLET | ORAL | Status: DC | PRN
  Administered 2017-12-02: 650 mg via ORAL
  Filled 2017-11-30: qty 2

## 2017-11-30 MED ORDER — NOREPINEPHRINE 4 MG/250ML-% IV SOLN
INTRAVENOUS | Status: AC
  Filled 2017-11-30: qty 250

## 2017-11-30 MED ORDER — VERAPAMIL HCL 2.5 MG/ML IV SOLN
INTRAVENOUS | Status: AC
  Filled 2017-11-30: qty 2

## 2017-11-30 MED ORDER — HEPARIN SODIUM (PORCINE) 5000 UNIT/ML IJ SOLN: 60.0000 [IU]/kg | Freq: Once | INTRAMUSCULAR | Status: DC

## 2017-11-30 MED ORDER — HEPARIN SODIUM (PORCINE) 1000 UNIT/ML IJ SOLN
INTRAMUSCULAR | Status: DC | PRN
  Administered 2017-11-30: 2000 [IU] via INTRAVENOUS
  Administered 2017-11-30: 10000 [IU] via INTRAVENOUS

## 2017-11-30 MED ORDER — SODIUM CHLORIDE 0.9 % IV SOLN: INTRAVENOUS | Status: DC

## 2017-11-30 MED ORDER — TICAGRELOR 90 MG PO TABS
90.0000 mg | ORAL_TABLET | Freq: Two times a day (BID) | ORAL | Status: DC
  Administered 2017-11-30 – 2017-12-03 (×7): 90 mg via ORAL
  Filled 2017-11-30 (×7): qty 1

## 2017-11-30 MED ORDER — ASPIRIN 81 MG PO CHEW: 324.0000 mg | CHEWABLE_TABLET | ORAL | Status: AC

## 2017-11-30 MED ORDER — ASPIRIN 81 MG PO CHEW: 324.0000 mg | CHEWABLE_TABLET | Freq: Once | ORAL | Status: AC

## 2017-11-30 MED ORDER — TIROFIBAN (AGGRASTAT) BOLUS VIA INFUSION
INTRAVENOUS | Status: DC | PRN
  Administered 2017-11-30: 2835 ug via INTRAVENOUS

## 2017-11-30 MED ORDER — SODIUM CHLORIDE 0.9 % IV SOLN: 250.0000 mL | INTRAVENOUS | Status: DC | PRN

## 2017-11-30 MED ORDER — ASPIRIN 81 MG PO CHEW
81.0000 mg | CHEWABLE_TABLET | Freq: Every day | ORAL | Status: DC
  Administered 2017-11-30 – 2017-12-02 (×3): 81 mg via ORAL
  Filled 2017-11-30 (×3): qty 1

## 2017-11-30 MED ORDER — ACETAMINOPHEN 325 MG PO TABS: 650.0000 mg | ORAL_TABLET | ORAL | Status: DC | PRN

## 2017-11-30 MED ORDER — TIROFIBAN HCL IN NACL 5-0.9 MG/100ML-% IV SOLN
INTRAVENOUS | Status: AC
  Filled 2017-11-30: qty 100

## 2017-11-30 MED ORDER — HEPARIN (PORCINE) IN NACL 1000-0.9 UT/500ML-% IV SOLN
INTRAVENOUS | Status: DC | PRN
  Administered 2017-11-30 (×2): 500 mL

## 2017-11-30 MED ORDER — ATROPINE SULFATE 1 MG/10ML IJ SOSY
PREFILLED_SYRINGE | INTRAMUSCULAR | Status: DC | PRN
  Administered 2017-11-30: 0.5 mg via INTRAVENOUS
  Administered 2017-11-30: 1 mg via INTRAVENOUS
  Administered 2017-11-30: 0.5 mg via INTRAVENOUS

## 2017-11-30 MED ORDER — TICAGRELOR 90 MG PO TABS
ORAL_TABLET | ORAL | Status: AC
  Filled 2017-11-30: qty 2

## 2017-11-30 MED ORDER — HEPARIN SODIUM (PORCINE) 5000 UNIT/ML IJ SOLN
5000.0000 [IU] | Freq: Three times a day (TID) | INTRAMUSCULAR | Status: DC
  Administered 2017-11-30 – 2017-12-03 (×8): 5000 [IU] via SUBCUTANEOUS
  Filled 2017-11-30 (×8): qty 1

## 2017-11-30 MED ORDER — SODIUM CHLORIDE 0.9% FLUSH: 3.0000 mL | INTRAVENOUS | Status: DC | PRN

## 2017-11-30 MED ORDER — ATORVASTATIN CALCIUM 80 MG PO TABS
80.0000 mg | ORAL_TABLET | Freq: Every day | ORAL | Status: DC
  Administered 2017-11-30 – 2017-12-02 (×3): 80 mg via ORAL
  Filled 2017-11-30 (×3): qty 1

## 2017-11-30 MED ORDER — IOHEXOL 350 MG/ML SOLN
INTRAVENOUS | Status: DC | PRN
  Administered 2017-11-30: 185 mL via INTRA_ARTERIAL

## 2017-11-30 MED ORDER — TIROFIBAN HCL IN NACL 5-0.9 MG/100ML-% IV SOLN
INTRAVENOUS | Status: AC | PRN
  Administered 2017-11-30: 0.15 ug/kg/min via INTRAVENOUS

## 2017-11-30 MED ORDER — MORPHINE SULFATE (PF) 4 MG/ML IV SOLN
4.0000 mg | Freq: Once | INTRAVENOUS | Status: AC
  Administered 2017-11-30: 4 mg via INTRAVENOUS
  Filled 2017-11-30: qty 1

## 2017-11-30 MED ORDER — LABETALOL HCL 5 MG/ML IV SOLN: 10.0000 mg | INTRAVENOUS | Status: AC | PRN

## 2017-11-30 MED ORDER — ONDANSETRON HCL 4 MG/2ML IJ SOLN
INTRAMUSCULAR | Status: AC
  Filled 2017-11-30: qty 2

## 2017-11-30 MED ORDER — POTASSIUM CHLORIDE CRYS ER 20 MEQ PO TBCR
40.0000 meq | EXTENDED_RELEASE_TABLET | Freq: Once | ORAL | Status: AC
  Administered 2017-11-30: 40 meq via ORAL
  Filled 2017-11-30: qty 2

## 2017-11-30 MED ORDER — SODIUM CHLORIDE 0.9 % IV SOLN
INTRAVENOUS | Status: DC
  Administered 2017-11-30 (×2): via INTRAVENOUS

## 2017-11-30 MED ORDER — LIDOCAINE HCL (PF) 1 % IJ SOLN
INTRAMUSCULAR | Status: AC
  Filled 2017-11-30: qty 30

## 2017-11-30 MED ORDER — TIROFIBAN HCL IV 12.5 MG/250 ML
0.1500 ug/kg/min | INTRAVENOUS | Status: AC
  Administered 2017-11-30: 0.075 ug/kg/min via INTRAVENOUS
  Filled 2017-11-30: qty 250

## 2017-11-30 MED ORDER — OXYCODONE HCL 5 MG PO TABS: 5.0000 mg | ORAL_TABLET | ORAL | Status: DC | PRN

## 2017-11-30 MED ORDER — HYDRALAZINE HCL 20 MG/ML IJ SOLN: 5.0000 mg | INTRAMUSCULAR | Status: AC | PRN

## 2017-11-30 MED ORDER — HEPARIN (PORCINE) IN NACL 1000-0.9 UT/500ML-% IV SOLN
INTRAVENOUS | Status: AC
  Filled 2017-11-30: qty 1000

## 2017-11-30 MED ORDER — ONDANSETRON HCL 4 MG/2ML IJ SOLN
4.0000 mg | Freq: Four times a day (QID) | INTRAMUSCULAR | Status: DC | PRN
  Administered 2017-11-30: 4 mg via INTRAVENOUS
  Filled 2017-11-30: qty 2

## 2017-11-30 MED ORDER — ISOSORBIDE MONONITRATE ER 30 MG PO TB24
30.0000 mg | ORAL_TABLET | Freq: Every day | ORAL | Status: DC
  Administered 2017-11-30 – 2017-12-03 (×4): 30 mg via ORAL
  Filled 2017-11-30 (×4): qty 1

## 2017-11-30 MED ORDER — ASPIRIN EC 81 MG PO TBEC: 81.0000 mg | DELAYED_RELEASE_TABLET | Freq: Every day | ORAL | Status: DC

## 2017-11-30 MED ORDER — NOREPINEPHRINE BITARTRATE 1 MG/ML IV SOLN
INTRAVENOUS | Status: AC | PRN
  Administered 2017-11-30: 5 ug/min via INTRAVENOUS

## 2017-11-30 SURGICAL SUPPLY — 24 items
BALLN EMERGE MR 3.5X15 (BALLOONS) ×2
BALLN SAPPHIRE 2.5X12 (BALLOONS) ×2
BALLN ~~LOC~~ EMERGE MR 4.5X15 (BALLOONS) ×2
BALLOON EMERGE MR 3.5X15 (BALLOONS) ×1 IMPLANT
BALLOON SAPPHIRE 2.5X12 (BALLOONS) IMPLANT
BALLOON ~~LOC~~ EMERGE MR 4.5X15 (BALLOONS) ×1 IMPLANT
CATH 5FR JL3.5 JR4 ANG PIG MP (CATHETERS) ×1 IMPLANT
CATH INFINITI 5FR MPB2 (CATHETERS) ×1 IMPLANT
CATH VISTA GUIDE 6FR JR4 (CATHETERS) ×2 IMPLANT
DEVICE RAD COMP TR BAND LRG (VASCULAR PRODUCTS) ×2 IMPLANT
GLIDESHEATH SLEND A-KIT 6F 22G (SHEATH) ×2 IMPLANT
GUIDELINER 6F (CATHETERS) ×2 IMPLANT
GUIDEWIRE INQWIRE 1.5J.035X260 (WIRE) ×1 IMPLANT
INQWIRE 1.5J .035X260CM (WIRE) ×2
KIT ENCORE 26 ADVANTAGE (KITS) ×1 IMPLANT
KIT HEART LEFT (KITS) ×2 IMPLANT
PACK CARDIAC CATHETERIZATION (CUSTOM PROCEDURE TRAY) ×2 IMPLANT
SHEATH PROBE COVER 6X72 (BAG) ×2 IMPLANT
STENT RESOLUTE ONYX 4.0X22 (Permanent Stent) ×4 IMPLANT
SYR MEDRAD MARK 7 150ML (SYRINGE) IMPLANT
TRANSDUCER W/STOPCOCK (MISCELLANEOUS) ×2 IMPLANT
TUBING CIL FLEX 10 FLL-RA (TUBING) ×2 IMPLANT
WIRE ASAHI PROWATER 180CM (WIRE) ×2 IMPLANT
WIRE HI TORQ VERSACORE-J 145CM (WIRE) ×2 IMPLANT

## 2017-11-30 NOTE — CV Procedure (Signed)
   Acute onset of chest pain approximately 1 hour before arrival in the hospital.  EKG from the field demonstrated inferior ST elevation consistent with STEMI.  History of hypertension and Behcet's disease.  Procedure performed via right radial approach with ultrasound guidance.  Catheterization was difficult because of innominate artery and aortic arch angulation.  Angulation made catheter torque and RCA selective engagement difficult.  RCA guide support was also less than optimal.  RCA totally occluded proximally.  Large vessel with distal tortuosity.  LAD contained 50% segmental mid narrowing.  Circumflex and left main were normal.  Difficult wiring of RCA.  Successful to site stent implantation with initial stent of the proximal vessel and subsequently a mid to distal stent placed through the proximal stent for disease identified after proximal stent implantation.  Guide liner was needed for support to deliver the second stent.  RCA 100% stenosis and 80% distal stenosis with TIMI grade 0 flow was improved to 0% and 0% after stenting with TIMI grade III flow after intracoronary nitroglycerin.  Procedure complicated by Darius [name] reflex with bradycardia, hypotension, requiring multiple doses of IV atropine and initiation of IV levo fed.  LVEDP 20 mmHg  at completion of the procedure.  LVEF greater than 50%.

## 2017-11-30 NOTE — ED Provider Notes (Signed)
[facility] DEPART[facility] Provider Note   CSN: [id number] Arrival date & time: 11/30/17  0041     History   Chief Complaint Chief Complaint  Patient presents with  . Code STEMI    HPI Alisha El [name] is a 62 y.o. male.  Level 5 caveat for acuity of condition.  Patient arrives via EMS with sudden onset of substernal chest pain shortness of breath that came on at midnight while he was resting.  Denies any radiation of the pain.  Is associated with nausea and diaphoresis.  EMS EKG showed inferior STEMI.  Patient denies any cardiac history.  Only medical problem is blood pressure.  Never had this kind of pain before.  He received aspirin and nitroglycerin by EMS.  Denies any back pain, leg pain, abdominal pain.  The history is provided by the patient and the EMS personnel. The history is limited by the condition of the patient.    Past Medical History:  Diagnosis Date  . Hypertension     Patient Active Problem List   Diagnosis Date Noted  . 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 HTN (hypertension) 08/06/2015    History reviewed. No pertinent surgical history.      Home Medications    Prior to Admission medications   Medication Sig Start Date End Date Taking? Authorizing Provider  chlorthalidone (HYGROTON) 25 MG tablet Take 1 tablet (25 mg total) by mouth daily. 07/21/17   Horald Pollen, MD    Family History No family history on file.  Social History Social History   Tobacco Use  . Smoking status: Current Every Day Smoker  . Smokeless tobacco: Never Used  Substance Use Topics  . Alcohol use: No  . Drug use: No     Allergies   Patient has no known allergies.   Review of Systems Review of Systems  Constitutional: Positive for diaphoresis. Negative for activity change, appetite change and fever.  Eyes: Negative for visual disturbance.  Respiratory: Positive for chest tightness and shortness of breath.   Cardiovascular:  Positive for chest pain.  Gastrointestinal: Positive for nausea. Negative for abdominal pain.  Genitourinary: Negative for dysuria, hematuria and testicular pain.  Musculoskeletal: Negative for arthralgias and myalgias.  Neurological: Positive for weakness. Negative for dizziness and headaches.    all other systems are negative except as noted in the HPI and PMH.    Physical Exam Updated Vital Signs BP 111/71   Pulse 71   Temp (!) 97.5 F (36.4 C) (Oral)   Resp 10   Ht 5\' 9"  (1.753 m)   Wt 111 kg   SpO2 92%   BMI 36.14 kg/m   Physical Exam  Constitutional: He is oriented to person, place, and time. He appears well-developed and well-nourished. No distress.  HENT:  Head: Normocephalic and atraumatic.  Mouth/Throat: Oropharynx is clear and moist. No oropharyngeal exudate.  Eyes: Pupils are equal, round, and reactive to light. Conjunctivae and EOM are normal.  Neck: Normal range of motion. Neck supple.  No meningismus.  Cardiovascular: Normal rate, regular rhythm, normal heart sounds and intact distal pulses.  No murmur heard. Equal radial, DP and PT pulses bilaterally  Pulmonary/Chest: Effort normal and breath sounds normal. No respiratory distress. He has no rales. He exhibits no tenderness.  Abdominal: Soft. There is no tenderness. There is no rebound and no guarding.  Musculoskeletal: Normal range of motion. He exhibits no edema or tenderness.  Neurological: He is alert and oriented to person, place,  and time. No cranial nerve deficit. He exhibits normal muscle tone. Coordination normal.  No ataxia on finger to nose bilaterally. No pronator drift. 5/5 strength throughout. CN 2-12 intact.Equal grip strength. Sensation intact.   Skin: Skin is warm. He is diaphoretic.  Psychiatric: He has a normal mood and affect. His behavior is normal.  Nursing note and vitals reviewed.    ED Treatments / Results  Labs (all labs ordered are listed, but only abnormal results are  displayed) Labs Reviewed  CBC WITH DIFFERENTIAL/PLATELET - Abnormal; Notable for the following components:      Result Value   WBC 16.9 (*)    Lymphs Abs 8.3 (*)    Monocytes Absolute 1.2 (*)    Basophils Absolute 0.2 (*)    All other components within normal limits  COMPREHENSIVE METABOLIC PANEL - Abnormal; Notable for the following components:   Sodium 132 (*)    Potassium 3.0 (*)    CO2 17 (*)    Glucose, Bld 177 (*)    All other components within normal limits  LIPID PANEL - Abnormal; Notable for the following components:   Triglycerides 227 (*)    HDL 34 (*)    VLDL 45 (*)    LDL Cholesterol 117 (*)    All other components within normal limits  I-STAT CHEM 8, ED - Abnormal; Notable for the following components:   Potassium 3.2 (*)    Creatinine, Ser 0.60 (*)    Glucose, Bld 179 (*)    Calcium, Ion 1.08 (*)    All other components within normal limits  MRSA PCR SCREENING  PROTIME-INR  APTT  TROPONIN I  HIV ANTIBODY (ROUTINE TESTING W REFLEX)  TSH  HEMOGLOBIN A1C  TROPONIN I  TROPONIN I  TROPONIN I  BASIC METABOLIC PANEL  LIPID PANEL  CBC  PROTIME-INR  CBC  CREATININE, SERUM  TROPONIN I  TROPONIN I  TROPONIN I  I-STAT TROPONIN, ED    EKG None  Radiology Dg Chest Port 1 View  Result Date: 11/30/2017 CLINICAL DATA:  Substernal chest pain and shortness of breath since midnight. EXAM: PORTABLE CHEST 1 VIEW COMPARISON:  None. FINDINGS: Shallow inspiration. Cardiac enlargement. No vascular congestion. Nodular interstitial pattern to the lungs. This could indicate granulomatous process or pneumoconiosis. No focal consolidation or airspace disease. No blunting of costophrenic angles. No pneumothorax. Mediastinal contours appear intact. Calcification of the aorta. IMPRESSION: Cardiac enlargement. Nonspecific nodular interstitial pattern to the lungs. No focal consolidation. Electronically Signed   By: Lucienne Capers M.D.   On: 11/30/2017 01:15     Procedures Procedures (including critical care time)  Medications Ordered in ED Medications  0.9 %  sodium chloride infusion ( Intravenous New Bag/Given 11/30/17 0051)  heparin injection 4,000 Units (4,000 Units Intravenous Given 11/30/17 0054)  morphine 4 MG/ML injection 4 mg (4 mg Intravenous Given 11/30/17 0050)  aspirin chewable tablet 324 mg (324 mg Oral Given by EMS 11/30/17 0051)     Initial Impression / Assessment and Plan / ED Course  I have reviewed the triage vital signs and the nursing notes.  Pertinent labs & imaging results that were available during my care of the patient were reviewed by me and considered in my medical decision making (see chart for details).    Sudden onset of central chest pain with shortness of breath and diaphoresis.  EKG with inferior STEMI.  Patient given aspirin and heparin. No further NTG due to inferior MI and likely RV involvement.   Patient  also given morphine for his chest pain.  Seen at bedside with cardiology Dr. Hassell Done and Dr. Tamala Julian.  Blood pressure and mental status remained stable in the ED.  Patient taken emergently to the catheterization lab with cardiology team.  CRITICAL CARE Performed by: Ezequiel Essex Total critical care time: 31 minutes Critical care time was exclusive of separately billable procedures and treating other patients. Critical care was necessary to treat or prevent imminent or life-threatening deterioration. Critical care was time spent personally by me on the following activities: development of treatment plan with patient and/or surrogate as well as nursing, discussions with consultants, evaluation of patient's response to treatment, examination of patient, obtaining history from patient or surrogate, ordering and performing treatments and interventions, ordering and review of laboratory studies, ordering and review of radiographic studies, pulse oximetry and re-evaluation of patient's  condition.   Final Clinical Impressions(s) / ED Diagnoses   Final diagnoses:  ST elevation myocardial infarction (STEMI), unspecified artery [facility])    ED Discharge Orders    None       Ezequiel Essex, MD 11/30/17 [phone]

## 2017-11-30 NOTE — Progress Notes (Addendum)
Progress Note  Patient Name: Darius [name] Date of Encounter: 11/30/2017  Primary Cardiologist: No primary care provider on file.   Subjective   Patient states that he has been having some substernal chest pain started 3 hours ago he says that he has been having some difficulty breathing.  He appears to be in distress and he is sweating.    Inpatient Medications    Scheduled Meds: . aspirin  81 mg Oral Daily  . atorvastatin  80 mg Oral q1800  . heparin  5,000 Units Subcutaneous Q8H  . metoprolol tartrate  12.5 mg Oral BID  . sodium chloride flush  3 mL Intravenous Q12H  . ticagrelor  90 mg Oral BID   Continuous Infusions: . sodium chloride 100 mL/hr at 11/30/17 0400  . sodium chloride    . tirofiban 0.0747 mcg/kg/min (11/30/17 0400)   PRN Meds: sodium chloride, acetaminophen, hydrALAZINE, labetalol, nitroGLYCERIN, ondansetron (ZOFRAN) IV, oxyCODONE, sodium chloride flush   Vital Signs    Vitals:   11/30/17 0445 11/30/17 0500 11/30/17 0600 11/30/17 0700  BP: 117/81 95/61 112/76 106/68  Pulse: 72 69 66 (!) 59  Resp: (!) 22 (!) 23 (!) 21 (!) 25  Temp:    98.3 F (36.8 C)  TempSrc:    Oral  SpO2: 93% (!) 88% 91% 92%  Weight:      Height:        Intake/Output Summary (Last 24 hours) at 11/30/2017 0744 Last data filed at 11/30/2017 8366 Gross per 24 hour  Intake 409.51 ml  Output -  Net 409.51 ml   Filed Weights   11/30/17 0121 11/30/17 0305  Weight: 113.4 kg 111 kg    Telemetry    Normal sinus rhythm- Personally Reviewed  ECG    Normal sinus rhythm, continued ST elevations in 2 3 and aVF inferior lateral STEMI- Personally Reviewed   Physical Exam   Physical Exam  Constitutional: Appears well-developed and well-nourished. Distressed. Diaphoretic HENT:  Head: Normocephalic and atraumatic.  Eyes: Conjunctivae are normal.  Cardiovascular: Normal rate, regular rhythm and normal heart sounds.  Respiratory: Effort normal and breath sounds normal. No  respiratory distress. No wheezes.  GI: Soft. Bowel sounds are normal. No distension. There is no tenderness.  Musculoskeletal: No edema.  Neurological: Is alert.  Skin: Not diaphoretic. No erythema.  Psychiatric: Normal mood and affect. Behavior is normal. Judgment and thought content normal.   Labs    Chemistry Recent Labs  Lab 11/30/17 0049 11/30/17 0055 11/30/17 0346  NA 132* 139  --   K 3.0* 3.2*  --   CL 101 104  --   CO2 17*  --   --   GLUCOSE 177* 179*  --   BUN 15 17  --   CREATININE 0.69 0.60* 0.94  CALCIUM 9.0  --   --   PROT 7.7  --   --   ALBUMIN 4.0  --   --   AST 23  --   --   ALT QUANTITY NOT SUFFICIENT, UNABLE TO PERFORM TEST  --   --   ALKPHOS 67  --   --   BILITOT 0.8  --   --   GFRNONAA >60  --  >60  GFRAA >60  --  >60  ANIONGAP 14  --   --      Hematology Recent Labs  Lab 11/30/17 0049 11/30/17 0055 11/30/17 0346  WBC 16.9*  --  20.3*  RBC 5.26  --  5.00  HGB 15.7 16.3 15.2  HCT 49.7 48.0 47.1  MCV 94.5  --  94.2  MCH 29.8  --  30.4  MCHC 31.6  --  32.3  RDW 13.3  --  13.4  PLT 326  --  312    Cardiac Enzymes Recent Labs  Lab 11/30/17 0049 11/30/17 0346  TROPONINI <0.03 2.20*    Recent Labs  Lab 11/30/17 0054  TROPIPOC 0.00     BNPNo results for input(s): BNP, PROBNP in the last 168 hours.   DDimer No results for input(s): DDIMER in the last 168 hours.   Radiology    Dg Chest Port 1 View  Result Date: 11/30/2017 CLINICAL DATA:  Substernal chest pain and shortness of breath since midnight. EXAM: PORTABLE CHEST 1 VIEW COMPARISON:  None. FINDINGS: Shallow inspiration. Cardiac enlargement. No vascular congestion. Nodular interstitial pattern to the lungs. This could indicate granulomatous process or pneumoconiosis. No focal consolidation or airspace disease. No blunting of costophrenic angles. No pneumothorax. Mediastinal contours appear intact. Calcification of the aorta. IMPRESSION: Cardiac enlargement. Nonspecific nodular  interstitial pattern to the lungs. No focal consolidation. Electronically Signed   By: Lucienne Capers M.D.   On: 11/30/2017 01:15    Cardiac Studies   Left heart cath 11/30/2017  Inferior ST elevation myocardial infarction due to 100% occlusion of the proximal RCA.  Successful therapy with proximal and distal vessel stenting reducing 100% and 80% stenoses to 10% and 0% respectively with improvement in TIMI flow from 0 to III.  22 x 4.0 mm Onyx stents were used with the proximal stent being postdilated to 4.5 mm in diameter.  Normal left main.  LAD contains segmental 60 to 70% mid vessel narrowing.  Large normal ramus intermedius.  Circumflex gives origin to 2 small obtuse marginal branches with a second marginal containing 60 to 70% diffuse disease in the mid vessel.  Inferobasal akinesis, overall LVEF greater than 60%.  LVEDP 20.  Delayed reperfusion due to tortuous, angulated innominate/aortic arch anatomy.  Procedure complicated by Bezold-Jarisch response with profound hypotension and bradycardia requiring intravenous atropine and IV Levophed for heart rate and blood pressure support.  RECOMMENDATIONS:   IV fluid at a rate of 100 cc/h.  Wean Levophed once blood pressure is consistently above 100 mmHg.  Continue IV ReoPro for 8 hours.  Aspirin and Plavix dual antiplatelet therapy for 12 months.  High intensity statin therapy.  Institute beta-blocker therapy as tolerated by heart rate and blood pressure.  Consider angiotensin converting enzyme inhibitor or angiotensin receptor blocker for blood pressure control if required.  Patient has been on diuretic therapy as monotherapy for hypertension.  Smoking cessation.   Recommend uninterrupted dual antiplatelet therapy with Aspirin 81mg  daily and Ticagrelor 90mg  twice daily for a minimum of 12 months (ACS - Class I recommendation).      Post-Intervention Diagram          Patient Profile     62 y.o. male with  hypertension,40 ppy smoking history, and without any prior cardiac history who presented on 11/30/2017 post 45 minutes of substernal chest pain with accompanied diaphoresis, dyspnea, nausea.  EKG found inferior STEMI (II,III,avf) and without any troponin elevation.  Concern for urgent cath which found 100% occlusion of proximal RCA, LAD 60 to 70% narrowing in the mid vessel.  2 drug-eluting stents were placed.  Assessment & Plan    Inferior wall STEMI s/p cath and DES placement Urgent cath done this morning 11/30/2017 which found 100% and 80% occlusion of  proximal and mid RCA respectively, LAD 60 to 70% narrowing in the mid vessel.  Two drug-eluting stents were placed. Due to large platelet burden used tirofiban for 8 hours.  There remains an embolus in the distal rca which is likely contributing to patient's continued chest pain.   -Continue tirofiban for 12 hrs total -Trending troponin- 2.2 -Started imdur 30mg  qd -Fasting lipid panel: Total cholesterol = 179, triglycerides = 74, HDL = 36, LDL = 128 -Started on atorvastatin 80 mg daily -A1c = 6.5 -TSH = 1.044 -TTE pending -Continue aspirin and Brilinta for at least 6 months and will -Normal saline at 100 mL/h -Wean levofed to keep BP >100 -Metoprolol 12.5 mg twice daily, will titrate this appropriately based on blood pressures -Smoking cessation  Hypertension Patient's blood pressure over the past few hours has ranged 100-120/70 to 80s while on levo fed.  Patient was taking her Thalitone 25 mg daily prior to admission.  -IV hydralazine 5 mg q. 20 minutes as needed -IV labetalol 10 mg every 10 minutes as needed -Metoprolol 12.5 mg twice daily -Levofed drip  For questions or updates, please contact [facility] Please consult [email or web address] for contact info under        Signed, Lars Mage, MD  11/30/2017, 7:44 AM

## 2017-11-30 NOTE — ED Notes (Signed)
Cardiology at bedside.

## 2017-11-30 NOTE — Care Management Note (Signed)
Case Management Note  Patient Details  Name: Richey El Zhan MRN: [id number] Date of Birth: 01/01/1956  Subjective/Objective:  61yo male presented with CP; s/p emergent cath with stents placed.                Action/Plan: CM met with patient/spouse to discuss transitional needs. Patient lived at home with spouse, independent with ADLs with no DME in place. Patient verified no local established PCP, nor active health insurance, but agreeable to CM assistance. Brilinta 30-day free card provided; CM discussed [facility] for $4-$10 discounted Rxs, with patient stating the ability to afford; agreeable to Rx being filled by TOC pharmacy service prior to transitioning home. Hospital f/u appointment scheduled with: [facility] on 12/23/17 @ 1050; AVS updated. Patient's family will provide transportation home. CM team will continue to follow.   Expected Discharge Date:                  Expected Discharge Plan:  Home/Self Care  In-House Referral:  NA  Discharge planning Services  CM Consult, Medication Assistance, Follow-up appt scheduled  Post Acute Care Choice:  NA Choice offered to:  NA  DME Arranged:  N/A DME Agency:  NA  HH Arranged:  NA HH Agency:  NA  Status of Service:  In process, will continue to follow  If discussed at Long Length of Stay Meetings, dates discussed:    Additional Comments:  Natalie Gay RN, BSN, NCM-BC, ACM-RN [phone] 11/30/2017, 12:23 PM  

## 2017-11-30 NOTE — Progress Notes (Signed)
Report received from Cath lab Verta Ellen, RN. Awaiting pt admission to 2H04.

## 2017-11-30 NOTE — Progress Notes (Signed)
   11/29/17 0050  Clinical Encounter Type  Visited With Patient  Visit Type Initial  Referral From Nurse   Chaplain responded to the ED for the [facility] page. Patient stated that his wife is at home with the kids. Offered some encouragement to the patient who stated there's nothing more he needed from the chaplain at this time.

## 2017-11-30 NOTE — ED Triage Notes (Signed)
Pt started having substernal CP and SOB since midnight. EMS gave 324mg  ASA and nitro x 1. Code STEMI activated by EMS

## 2017-11-30 NOTE — H&P (Signed)
History and Physical   Patient ID: Darius [name], MRN: [id number], DOB: 09-05-1955   Date of Encounter: 11/30/2017, 1:00 AM  Primary Care Provider: Patient, No Pcp Per Cardiologist: NA Electrophysiologist:  NA  Chief Complaint:  STEMI  History of Present Illness: Darius [name] is a 62 y.o. male w/ no prior cardiac hx, but w/ h/o HTN who presents via EMS for STEMI. EKG shows inferior ST elevation ~30mm. Pt says he had acute onset of substernal chest pressure/severe tightness approximately 43min prior to arrival at the ED. The pain was severe, 7-8 out of 10, accompanied by nausea, diaphoresis and SOB. The sx did not subside, and pt felt he was unable to drive himself to the ED, so he called EMS. EMS found inferior ST elevation and activated code STEMI en route.  Pt has no prior h/o cardiac problems; he denies any similar sx in the past. He denies recent exertional chest discomfort, SOB or any other exertional cardiac sx.  Past Medical History:  Diagnosis Date  . Hypertension     History reviewed. No pertinent surgical history.   Prior to Admission medications   Medication Sig Start Date End Date Taking? Authorizing Provider  chlorthalidone (HYGROTON) 25 MG tablet Take 1 tablet (25 mg total) by mouth daily. 07/21/17  Yes Sagardia, Ines Bloomer, MD     Allergies: No Known Allergies  Social History:  The patient  reports that he has been smoking. He has never used smokeless tobacco. He reports that he does not drink alcohol or use drugs.   Family History:  The patient's family history is not on file.  FHx non-contributory  ROS:  Please see the history of present illness.     All other systems reviewed and negative.   Vital Signs: Blood pressure 105/64, pulse (!) 56, resp. rate 15, SpO2 91 %.  PHYSICAL EXAM: General:  Well nourished, well developed, in mild distress. Appears diaphoretic HEENT: normal Lymph: no adenopathy Neck: no JVD Endocrine:  No thryomegaly Vascular: No  carotid bruits; DP pulses 2+ bilaterally  Cardiac:  normal S1, S2; RRR; no murmur  Lungs:  clear to auscultation bilaterally, no wheezing, rhonchi or rales  Abd: soft, nontender, no hepatomegaly  Ext: no edema Musculoskeletal:  No deformities, BUE and BLE strength normal and equal Skin: warm and dry  Neuro:  CNs 2-12 intact, no focal abnormalities noted Psych:  Normal affect   EKG:  SB with HR high 50s, ~32mm ST elevation in the inferior leads  Labs:   Lab Results  Component Value Date   WBC 8.7 06/25/2016   HGB 16.3 11/30/2017   HCT 48.0 11/30/2017   MCV 96 06/25/2016   PLT 273 06/25/2016   Recent Labs  Lab 11/30/17 0055  NA 139  K 3.2*  CL 104  BUN 17  CREATININE 0.60*  GLUCOSE 179*   No results for input(s): CKTOTAL, CKMB, TROPONINI in the last 72 hours. Troponin (Point of Care Test) No results for input(s): TROPIPOC in the last 72 hours.  Lab Results  Component Value Date   CHOL 179 06/25/2016   HDL 33 (L) 06/25/2016   LDLCALC 112 (H) 06/25/2016   TRIG 170 (H) 06/25/2016   No results found for: DDIMER  Radiology/Studies:  No results found.    ASSESSMENT AND PLAN:   1. STEMI: urgent LHC. Will start daily ASA, statin, BB. Check A1c, TSH, FLP for risk stratification. TTE in the AM.  2. HTN: restart home meds and adjust PRN.  Thank you for the opportunity to participate in the care of this patient. Will follow. Please call w/ questions.   Signed,  Rudean Curt, MD, [facility]  11/30/2017 1:00 AM

## 2017-11-30 NOTE — Progress Notes (Signed)
  Echocardiogram 2D Echocardiogram has been performed.  Jennette Dubin 11/30/2017, 4:43 PM

## 2017-11-30 NOTE — Progress Notes (Signed)
Troponin level 2.2, expected given pt STEMI. Assessment ongoing.

## 2017-11-30 NOTE — Progress Notes (Signed)
   11/30/17 0100  Clinical Encounter Type  Visited With Family  Visit Type Psychological support  Referral From Nurse    Tried to console [name]'s wife when she did show up. She is understandably anxious and not 100% proficient in [place]. Thankfully, another family member arrived who's more proficient. Helped convey him and two children from ED to the [facility] waiting.

## 2017-12-01 DIAGNOSIS — E785 Hyperlipidemia, unspecified: Secondary | ICD-10-CM

## 2017-12-01 DIAGNOSIS — I10 Essential (primary) hypertension: Secondary | ICD-10-CM

## 2017-12-01 LAB — BASIC METABOLIC PANEL
Anion gap: 12 (ref 5–15)
BUN: 21 mg/dL — AB (ref 6–20)
CHLORIDE: 102 mmol/L (ref 98–111)
CO2: 22 mmol/L (ref 22–32)
Calcium: 9 mg/dL (ref 8.9–10.3)
Creatinine, Ser: 1.07 mg/dL (ref 0.61–1.24)
GFR calc Af Amer: >60 mL/min (ref >60)
GFR calc non Af Amer: >60 mL/min (ref >60)
Glucose, Bld: 182 mg/dL — ABNORMAL HIGH (ref 70–99)
POTASSIUM: 4.3 mmol/L (ref 3.5–5.1)
SODIUM: 136 mmol/L (ref 135–145)

## 2017-12-01 LAB — CBC
HCT: 42.6 % (ref 39.0–52.0)
Hemoglobin: 13.3 g/dL (ref 13.0–17.0)
MCH: 29.6 pg (ref 26.0–34.0)
MCHC: 31.2 g/dL (ref 30.0–36.0)
MCV: 94.7 fL (ref 80.0–100.0)
Platelets: 257 10*3/uL (ref 150–400)
RBC: 4.5 MIL/uL (ref 4.22–5.81)
RDW: 13.8 % (ref 11.5–15.5)
WBC: 16.2 10*3/uL — ABNORMAL HIGH (ref 4.0–10.5)
nRBC: 0 % (ref 0.0–0.2)

## 2017-12-01 LAB — TROPONIN I: TROPONIN I: 24 ng/mL — AB (ref <0.03)

## 2017-12-01 LAB — HIV ANTIBODY (ROUTINE TESTING W REFLEX): HIV SCREEN 4TH GENERATION: NONREACTIVE

## 2017-12-01 MED ORDER — METOPROLOL SUCCINATE ER 25 MG PO TB24
12.5000 mg | ORAL_TABLET | Freq: Every day | ORAL | Status: DC
  Administered 2017-12-02 – 2017-12-03 (×2): 12.5 mg via ORAL
  Filled 2017-12-01 (×3): qty 1

## 2017-12-01 MED ORDER — FUROSEMIDE 40 MG PO TABS
40.0000 mg | ORAL_TABLET | Freq: Every day | ORAL | Status: DC
  Administered 2017-12-01 – 2017-12-02 (×2): 40 mg via ORAL
  Filled 2017-12-01 (×2): qty 1

## 2017-12-01 MED FILL — Lidocaine HCl Local Preservative Free (PF) Inj 1%: INTRAMUSCULAR | Qty: 30 | Status: AC

## 2017-12-01 NOTE — Progress Notes (Addendum)
Progress Note  Patient Name: Darius [name] Date of Encounter: 12/01/2017  Primary Cardiologist: No primary care provider on file.   Subjective   Darius [name] was seen laying in his bed this morning. He states that he still has some mild amount of chest pain that is worse when he takes a deep breath.   Inpatient Medications    Scheduled Meds: . aspirin  81 mg Oral Daily  . atorvastatin  80 mg Oral q1800  . heparin  5,000 Units Subcutaneous Q8H  . isosorbide mononitrate  30 mg Oral Daily  . metoprolol tartrate  12.5 mg Oral BID  . sodium chloride flush  3 mL Intravenous Q12H  . ticagrelor  90 mg Oral BID   Continuous Infusions: . sodium chloride     PRN Meds: sodium chloride, acetaminophen, nitroGLYCERIN, ondansetron (ZOFRAN) IV, oxyCODONE, sodium chloride flush   Vital Signs    Vitals:   12/01/17 0000 12/01/17 0053 12/01/17 0352 12/01/17 0740  BP: 117/79     Pulse: (!) 50     Resp: (!) 24     Temp:  97.8 F (36.6 C) (!) 97 F (36.1 C) (!) 97.5 F (36.4 C)  TempSrc:   Axillary Oral  SpO2: 91%     Weight:      Height:        Intake/Output Summary (Last 24 hours) at 12/01/2017 0806 Last data filed at 12/01/2017 0200 Gross per 24 hour  Intake 360.43 ml  Output 350 ml  Net 10.43 ml   Filed Weights   11/30/17 0121 11/30/17 0305  Weight: 113.4 kg 111 kg    Telemetry    NSR - Personally Reviewed  ECG    Sinus rhythm with T wave inversions in 2 3 and aVF- Personally Reviewed  Physical Exam   Physical Exam  Constitutional: Appears well-developed and well-nourished. No distress.  HENT:  Head: Normocephalic and atraumatic.  Eyes: Conjunctivae are normal.  Cardiovascular: Normal rate, regular rhythm and normal heart sounds.  Respiratory: Effort normal and breath sounds normal. No respiratory distress. No wheezes.  GI: Soft. Bowel sounds are normal. No distension. There is no tenderness.  Musculoskeletal: No edema.  Neurological: Is alert.  Skin:  Not diaphoretic. No erythema.  Psychiatric: Normal mood and affect. Behavior is normal. Judgment and thought content normal.   Labs    Chemistry Recent Labs  Lab 11/30/17 0049 11/30/17 0055 11/30/17 0346 11/30/17 0916 12/01/17 0239  NA 132* 139  --  137 136  K 3.0* 3.2*  --  4.3 4.3  CL 101 104  --  105 102  CO2 17*  --   --  19* 22  GLUCOSE 177* 179*  --  172* 182*  BUN 15 17  --  18 21*  CREATININE 0.69 0.60* 0.94 0.86 1.07  CALCIUM 9.0  --   --  8.9 9.0  PROT 7.7  --   --   --   --   ALBUMIN 4.0  --   --   --   --   AST 23  --   --   --   --   ALT QUANTITY NOT SUFFICIENT, UNABLE TO PERFORM TEST  --   --   --   --   ALKPHOS 67  --   --   --   --   BILITOT 0.8  --   --   --   --   GFRNONAA >60  --  >60 >60 >60  GFRAA >60  --  >60 >60 >60  ANIONGAP 14  --   --  13 12     Hematology Recent Labs  Lab 11/30/17 0346 11/30/17 0916 12/01/17 0239  WBC 20.3* 14.8* 16.2*  RBC 5.00 4.65 4.50  HGB 15.2 14.1 13.3  HCT 47.1 43.7 42.6  MCV 94.2 94.0 94.7  MCH 30.4 30.3 29.6  MCHC 32.3 32.3 31.2  RDW 13.4 13.7 13.8  PLT 312 259 257    Cardiac Enzymes Recent Labs  Lab 11/30/17 0916 11/30/17 1429 11/30/17 2132 12/01/17 0239  TROPONINI 10.37* 22.82* 27.55* 24.00*    Recent Labs  Lab 11/30/17 0054  TROPIPOC 0.00     BNPNo results for input(s): BNP, PROBNP in the last 168 hours.   DDimer No results for input(s): DDIMER in the last 168 hours.   Radiology    Dg Chest Port 1 View  Result Date: 11/30/2017 CLINICAL DATA:  Substernal chest pain and shortness of breath since midnight. EXAM: PORTABLE CHEST 1 VIEW COMPARISON:  None. FINDINGS: Shallow inspiration. Cardiac enlargement. No vascular congestion. Nodular interstitial pattern to the lungs. This could indicate granulomatous process or pneumoconiosis. No focal consolidation or airspace disease. No blunting of costophrenic angles. No pneumothorax. Mediastinal contours appear intact. Calcification of the aorta.  IMPRESSION: Cardiac enlargement. Nonspecific nodular interstitial pattern to the lungs. No focal consolidation. Electronically Signed   By: Lucienne Capers M.D.   On: 11/30/2017 01:15    Cardiac Studies   Left heart cath 11/30/2017  Inferior ST elevation myocardial infarction due to 100% occlusion of the proximal RCA. Successful therapy with proximal and distal vessel stenting reducing 100% and 80% stenoses to 10% and 0% respectively with improvement in TIMI flow from 0 to III. 22 x 4.0 mm Onyx stents were used with the proximal stent being postdilated to 4.5 mm in diameter.  Normal left main.  LAD contains segmental 60 to 70% mid vessel narrowing.  Large normal ramus intermedius.  Circumflex gives origin to 2 small obtuse marginal branches with a second marginal containing 60 to 70% diffuse disease in the mid vessel.  Inferobasal akinesis, overall LVEF greater than 60%. LVEDP 20.  Delayed reperfusion due to tortuous, angulated innominate/aortic arch anatomy.  Procedure complicated by Bezold-Jarisch response with profound hypotension and bradycardia requiring intravenous atropine and IV Levophed for heart rate and blood pressure support.  RECOMMENDATIONS:   IV fluid at a rate of 100 cc/h.  Wean Levophed once blood pressure is consistently above 100 mmHg.  Continue IV ReoPro for 8 hours.  Aspirin and Plavix dual antiplatelet therapy for 12 months.  High intensity statin therapy.  Institute beta-blocker therapy as tolerated by heart rate and blood pressure. Consider angiotensin converting enzyme inhibitor or angiotensin receptor blocker for blood pressure control if required. Patient has been on diuretic therapy as monotherapy for hypertension.  Smoking cessation.   Recommend uninterrupted dual antiplatelet therapy with Aspirin 81mg  daily and Ticagrelor 90mg  twice dailyfor a minimum of 12 months (ACS - Class I recommendation).      Post-Intervention Diagram           Patient Profile     62 y.o. male with hypertension,40 ppy smoking history, and without any prior cardiac history who presented on 11/30/2017 post 45 minutes of substernal chest pain with accompanied diaphoresis, dyspnea, nausea.  EKG found inferior STEMI (II,III,avf) and without any troponin elevation.  Concern for urgent cath which found 100% occlusion of proximal RCA, LAD 60 to 70% narrowing in the mid  vessel.  2 drug-eluting stents were placed.  Assessment & Plan    Inferior wall STEMI s/p cath and DES placement Patient states that he still has some mild amount of chest pain worsened with deep breaths that is likely due to the thrombus in the distal portion of rca. Patient's chest pain should improve over the next few days. Chest xray 11/18 was poor quality but showed nodular interstitial pattern to lungs.   He has completed his dose of Aggrastat.  TTE showed EF 60 to 65%, moderate LVH, inferior basal hypokinesis, dilated IVC.  Troponin peaked to 27 and is now trending down.  Heart rate 50 to 60s and pressures ranging 110-130/60-70s.  -Will give po lasix 40mg  as patient  -Continue atorvastatin 80 mg daily -Continue imdur 30mg  qd -Continue aspirin and Brilinta for at least 6 months and will -Discontinued IV fluids -Metoprolol 12.5 mg daily  -Smoking cessation  Hypertension Patient's blood pressure over the past few hours has ranged 110-130/60-70s. Patient was taking chlorthalidone 25 mg daily prior to admission.  -IV hydralazine 5 mg q. 20 minutes as needed -IV labetalol 10 mg every 10 minutes as needed -Metoprolol 12.5 mg twice daily -Levofed drip discontinued   For questions or updates, please contact [place] Please consult [email or web address] for contact info under        Signed, Lars Mage, MD  12/01/2017, 8:06 AM

## 2017-12-01 NOTE — Progress Notes (Signed)
CARDIAC REHAB PHASE I   PRE:  Rate/Rhythm: 7 SR  BP:  Sitting: 109/93      SaO2: 90 RA  MODE:  Ambulation: 160 ft 78 peak HR  POST:  Rate/Rhythm: 63 SR  BP:  Sitting: 121/69    SaO2: 93 RA   Pt ambulated 129ft in hallway assist of one with slow steady pace. Pt c/o fatigue and SOB. Pt returned to recliner, call bell within reach. Family at bedside. Will continue to follow.  [place], RN BSN 12/01/2017 1:56 PM

## 2017-12-02 DIAGNOSIS — F17200 Nicotine dependence, unspecified, uncomplicated: Secondary | ICD-10-CM

## 2017-12-02 DIAGNOSIS — I2119 ST elevation (STEMI) myocardial infarction involving other coronary artery of inferior wall: Principal | ICD-10-CM

## 2017-12-02 LAB — URINALYSIS, ROUTINE W REFLEX MICROSCOPIC
BACTERIA UA: NONE SEEN
BILIRUBIN URINE: NEGATIVE
Glucose, UA: NEGATIVE mg/dL
KETONES UR: NEGATIVE mg/dL
Leukocytes, UA: NEGATIVE
NITRITE: NEGATIVE
PH: 8 (ref 5.0–8.0)
PROTEIN: 30 mg/dL — AB
Specific Gravity, Urine: 1.014 (ref 1.005–1.030)

## 2017-12-02 MED ORDER — FINASTERIDE 5 MG PO TABS
5.0000 mg | ORAL_TABLET | Freq: Every day | ORAL | Status: DC
  Administered 2017-12-02 – 2017-12-03 (×2): 5 mg via ORAL
  Filled 2017-12-02 (×2): qty 1

## 2017-12-02 NOTE — Progress Notes (Signed)
UA sent per MD order d/t patient c/o blood and clots in urine.  Patient not willing to allow staff to see urine sample until now.  Order given and initiated.

## 2017-12-02 NOTE — Consult Note (Addendum)
Urology Consult  Consulting MD: Lars Mage, MD  CC: Blood in urine  HPI: This is a 62year old male, native of [place], who underwent PTCA with stent placement on 11.18.2019 by Dr. Daneen Schick for a STEMI.  Since the procedure, with him being on antiplatelet therapy, he has developed a gross hematuria.  This is painless in nature, total, but without any lower urinary tract symptomatology.  Patient states that 5-6 times in the past several years he has had very short episodes of gross painless hematuria that resolved in less than a day.  He is a smoker.  He has no accompanying flank pain.  There is no prior history of kidney stones or other renal issues.  Apparently, he has seen a local urologist for testicular cysts.  PMH: Past Medical History:  Diagnosis Date  . Hypertension     PSH: Past Surgical History:  Procedure Laterality Date  . CORONARY/GRAFT ACUTE MI REVASCULARIZATION N/A 11/30/2017   Procedure: Coronary/Graft Acute MI Revascularization;  Surgeon: Belva Crome, MD;  Location: [facility];  Service: Cardiovascular;  Laterality: N/A;  . LEFT HEART CATH AND CORONARY ANGIOGRAPHY N/A 11/30/2017   Procedure: LEFT HEART CATH AND CORONARY ANGIOGRAPHY;  Surgeon: Belva Crome, MD;  Location: [facility];  Service: Cardiovascular;  Laterality: N/A;    Allergies: No Known Allergies  Medications: Medications Prior to Admission  Medication Sig Dispense Refill Last Dose  . chlorthalidone (HYGROTON) 25 MG tablet Take 1 tablet (25 mg total) by mouth daily. 90 tablet 3 11/29/2017 at Unknown time     Social History: Social History   Socioeconomic History  . Marital status: Married    Spouse name: Not on file  . Number of children: Not on file  . Years of education: Not on file  . Highest education level: Not on file  Occupational History  . Not on file  Social Needs  . Financial resource strain: Not on file  . Food insecurity:    Worry: Not on file   Inability: Not on file  . Transportation needs:    Medical: Not on file    Non-medical: Not on file  Tobacco Use  . Smoking status: Current Every Day Smoker  . Smokeless tobacco: Never Used  Substance and Sexual Activity  . Alcohol use: No  . Drug use: No  . Sexual activity: Not on file  Lifestyle  . Physical activity:    Days per week: Not on file    Minutes per session: Not on file  . Stress: Not on file  Relationships  . Social connections:    Talks on phone: Not on file    Gets together: Not on file    Attends religious service: Not on file    Active member of club or organization: Not on file    Attends meetings of clubs or organizations: Not on file    Relationship status: Not on file  . Intimate partner violence:    Fear of current or ex partner: Not on file    Emotionally abused: Not on file    Physically abused: Not on file    Forced sexual activity: Not on file  Other Topics Concern  . Not on file  Social History Narrative  . Not on file    Family History: History reviewed. No pertinent family history.  Review of Systems: Positive: Gross hematuria Negative: .  A further 10 point review of systems was negative except what is listed in  the HPI.  Physical Exam: @VITALS2 @ General: No acute distress.  Awake. Head:  Normocephalic.  Atraumatic. ENT:  EOMI.  Mucous membranes moist Neck:  Supple.  No lymphadenopathy. CV:  Regular rate. Pulmonary: Equal effort bilaterally.   Abdomen: Soft.  Non-tender to palpation. Skin:  Normal turgor.  No visible rash. Extremity: No gross deformity of upper extremities.  No gross deformity of lower extremities. Neurologic: Alert. Appropriate mood.    Studies:  Recent Labs    11/30/17 0916 12/01/17 0239  HGB 14.1 13.3  WBC 14.8* 16.2*  PLT 259 257    Recent Labs    11/30/17 0916 12/01/17 0239  NA 137 136  K 4.3 4.3  CL 105 102  CO2 19* 22  BUN 18 21*  CREATININE 0.86 1.07  CALCIUM 8.9 9.0  GFRNONAA >60  >60  GFRAA >60 >60     Recent Labs    11/30/17 0049 11/30/17 0916  INR 1.05 1.05  APTT 29  --      Invalid input(s): ABG    Assessment: Gross hematuria.  The patient is a smoker, so at increased risk for urothelial malignancies.  He does have prior short-lived episodes of this.  Currently, he is emptying fairly well without a catheter.  He is passing small clots.  Plan: I initially ordered catheter placement.  The patient refused this.  Currently, he is emptying well so I am fine leaving the catheter out.  However, with him on antiplatelet therapy, there is a fair chance that this will be a protracted issue for him unless he has bladder irrigation.  I will start him on finasteride.  If he does have some prostatic bleeding, this may well help.  I would recommend CT abdomen and pelvis/hematuria protocol to evaluate his upper tracts as well as ureters and find any significant pathology within the bladder.  I have ordered this, as well as basic metabolic panel to assess renal function, as he has had recent contrast for his revascularization procedure.  He will eventually need cystoscopy.  This can be performed on an outpatient basis in the office.  If the patient's CT shows no severe pathology, and he is voiding adequately/emptying appropriately, I am fine with letting him go home as long as his hematuria does not get worse.  We will arrange proper follow-up      Pager:[phone]

## 2017-12-02 NOTE — Progress Notes (Signed)
Nurse called Dr. Ellyn Hack mentioning that patient has been having hematuria and blood clots in his urine which patient reports started after his admission. Patient has not had foley placed during this admission.   Patient mentioned that he has noticed blood clots 2-3 times previously in the past 5 years. He has a history of testicular cyst which he mentions that his doctor felt did not need to be intervened on.   -UA ordered  -Called urology who mentioned that they will see him and ordered 22 french hematuria catheter -Hold aspirin, can continue brillinta  Lars Mage, MD Internal Medicine PGY2 Pager:[phone] 12/02/2017, 4:53 PM

## 2017-12-02 NOTE — Progress Notes (Signed)
Bedside education done with patient and wife regarding post STEMI care, diet and risk factors.  Dr. Maricela Bo at bedside during education. Intermittent understanding of education especially from wife. Patient had much understanding and reiterated information explained Patient ambulated 370 ft. in the hallway w/o CP or SOB.

## 2017-12-02 NOTE — Progress Notes (Signed)
CARDIAC REHAB PHASE I   Pt and wife state they have been ambulating in hallways without any difficulty. State improvement in his breathing today. Pt educated on importance of ASA, Brilinta, and NTG. Stressed several times how to take the Brilinta, and that it was very important to not stop taking unless his cardiologist said it was ok. Pt given MI book and stent card, along with heart healthy diet and smoking cessation tip sheet. Strongly encourage pt to quit smoking, and educated pt and wife that quitting for "one week" was not enough. Reviewed restrictions and exercise guidelines. Pt declined use of interpreter for education. Pt seemed to comprehend, asking appropriate questions and able to correct his wife. Pt wife very anxious and did not seem to understand as well. RN aware, and several attempts have been made to relay this information. Will refer to CRP II [place]. Will continue to follow and reinforce education.  [id number] Rufina Falco, RN BSN 12/02/2017 2:20 PM

## 2017-12-02 NOTE — Procedures (Addendum)
Progress Note  Patient Name: Darius [name] Date of Encounter: 12/02/2017  Primary Cardiologist: No primary care provider on file.    Subjective   [name] was seen sitting up in chair by bedside this morning.  He was able to walk around the hallway and states that he did not have any chest pain or difficulty breathing after doing so.  Had long conversation with wife and patient regarding smoking cessation.  The patient has smoked 1-1/2 packs/day for several years and his wife is also an active smoker.  Counseled the patient on the importance of both of them quitting smoking.  Inpatient Medications    Scheduled Meds: . aspirin  81 mg Oral Daily  . atorvastatin  80 mg Oral q1800  . furosemide  40 mg Oral Daily  . heparin  5,000 Units Subcutaneous Q8H  . isosorbide mononitrate  30 mg Oral Daily  . metoprolol succinate  12.5 mg Oral Daily  . sodium chloride flush  3 mL Intravenous Q12H  . ticagrelor  90 mg Oral BID   Continuous Infusions: . sodium chloride     PRN Meds: sodium chloride, acetaminophen, nitroGLYCERIN, ondansetron (ZOFRAN) IV, oxyCODONE, sodium chloride flush   Vital Signs    Vitals:   12/02/17 0400 12/02/17 0429 12/02/17 0600 12/02/17 0751  BP: (!) 98/45 (!) 114/57 135/63   Pulse: 61 71 65   Resp: (!) 24 19 15    Temp:    97.9 F (36.6 C)  TempSrc:    Oral  SpO2: 92% 93% 92%   Weight:      Height:        Intake/Output Summary (Last 24 hours) at 12/02/2017 0801 Last data filed at 12/01/2017 1800 Gross per 24 hour  Intake 350 ml  Output -  Net 350 ml   Filed Weights   11/30/17 0121 11/30/17 0305  Weight: 113.4 kg 111 kg    Telemetry    NSR- Personally Reviewed  ECG    None today- Personally Reviewed  Physical Exam   Physical Exam  Constitutional: Appears well-developed and well-nourished. No distress.  HENT:  Head: Normocephalic and atraumatic.  Eyes: Conjunctivae are normal.  Cardiovascular: Normal rate, regular rhythm and  normal heart sounds.  Respiratory: Effort normal and breath sounds normal. No respiratory distress. No wheezes.  GI: Soft. Bowel sounds are normal. No distension. There is no tenderness.  Musculoskeletal: No edema.  Neurological: Is alert.  Skin: Not diaphoretic. No erythema.  Psychiatric: Normal mood and affect. Behavior is normal. Judgment and thought content normal.    Labs    Chemistry Recent Labs  Lab 11/30/17 0049 11/30/17 0055 11/30/17 0346 11/30/17 0916 12/01/17 0239  NA 132* 139  --  137 136  K 3.0* 3.2*  --  4.3 4.3  CL 101 104  --  105 102  CO2 17*  --   --  19* 22  GLUCOSE 177* 179*  --  172* 182*  BUN 15 17  --  18 21*  CREATININE 0.69 0.60* 0.94 0.86 1.07  CALCIUM 9.0  --   --  8.9 9.0  PROT 7.7  --   --   --   --   ALBUMIN 4.0  --   --   --   --   AST 23  --   --   --   --   ALT QUANTITY NOT SUFFICIENT, UNABLE TO PERFORM TEST  --   --   --   --  ALKPHOS 67  --   --   --   --   BILITOT 0.8  --   --   --   --   GFRNONAA >60  --  >60 >60 >60  GFRAA >60  --  >60 >60 >60  ANIONGAP 14  --   --  13 12     Hematology Recent Labs  Lab 11/30/17 0346 11/30/17 0916 12/01/17 0239  WBC 20.3* 14.8* 16.2*  RBC 5.00 4.65 4.50  HGB 15.2 14.1 13.3  HCT 47.1 43.7 42.6  MCV 94.2 94.0 94.7  MCH 30.4 30.3 29.6  MCHC 32.3 32.3 31.2  RDW 13.4 13.7 13.8  PLT 312 259 257    Cardiac Enzymes Recent Labs  Lab 11/30/17 0916 11/30/17 1429 11/30/17 2132 12/01/17 0239  TROPONINI 10.37* 22.82* 27.55* 24.00*    Recent Labs  Lab 11/30/17 0054  TROPIPOC 0.00     BNPNo results for input(s): BNP, PROBNP in the last 168 hours.   DDimer No results for input(s): DDIMER in the last 168 hours.   Radiology    No results found.  Cardiac Studies  Left heart cath 11/30/2017  Inferior ST elevation myocardial infarction due to 100% occlusion of the proximal RCA. Successful therapy with proximal and distal vessel stenting reducing 100% and 80% stenoses to 10% and 0%  respectively with improvement in TIMI flow from 0 to III. 22 x 4.0 mm Onyx stents were used with the proximal stent being postdilated to 4.5 mm in diameter.  Normal left main.  LAD contains segmental 60 to 70% mid vessel narrowing.  Large normal ramus intermedius.  Circumflex gives origin to 2 small obtuse marginal branches with a second marginal containing 60 to 70% diffuse disease in the mid vessel.  Inferobasal akinesis, overall LVEF greater than 60%. LVEDP 20.  Delayed reperfusion due to tortuous, angulated innominate/aortic arch anatomy.  Procedure complicated by Bezold-Jarisch response with profound hypotension and bradycardia requiring intravenous atropine and IV Levophed for heart rate and blood pressure support.  RECOMMENDATIONS:   IV fluid at a rate of 100 cc/h.  Wean Levophed once blood pressure is consistently above 100 mmHg.  Continue IV ReoPro for 8 hours.  Aspirin and Plavix dual antiplatelet therapy for 12 months.  High intensity statin therapy.  Institute beta-blocker therapy as tolerated by heart rate and blood pressure. Consider angiotensin converting enzyme inhibitor or angiotensin receptor blocker for blood pressure control if required. Patient has been on diuretic therapy as monotherapy for hypertension.  Smoking cessation.   Recommend uninterrupted dual antiplatelet therapy with Aspirin 81mg  daily and Ticagrelor 90mg  twice dailyfor a minimum of 12 months (ACS - Class I recommendation).      Post-Intervention Diagram         TTE showed EF 60 to 65%, moderate LVH, inferior basal hypokinesis, dilated IVC.  Troponin peaked to 27 and is now trending down.   Patient Profile     62 y.o. male hypertension,40 ppy smoking history, and without any prior cardiac history who presented on 11/30/2017 post 45 minutes of substernal chest pain with accompanied diaphoresis, dyspnea, nausea. EKG found inferior STEMI (II,III,avf)and without any troponin  elevation. Concern for urgent cath which found 100% occlusion of proximal RCA, LAD 60 to 70% narrowing in the mid vessel.2 drug-eluting stents were placed.  Assessment & Plan    Inferior wall STEMIs/pcath and DES placement The patient is chest pain-free and does not have any difficulty breathing this morning.  He was able to work with  cardiac rehab yesterday and ambulated 160 feet in the hallway.  He stated that he had fatigue and difficulty breathing after he ambulated.  The patient's blood pressure is 90-130/48-60s with rates ranging 60-70s.  Patient is to be transferred to telemetry orders were placed yesterday but due to low bed availability who is not able to be transferred.  Patient interpreter assistance when counseling him and his wife.  -Continue atorvastatin 80 mg daily -Continue imdur 30mg  qd -Continue (DAPT) aspirin and Brilinta for at least 6 months  -Metoprolol succinate 12.5 mg daily  -Smoking cessation counseling provided  Hypertension Patient's blood pressure over the past few hours has 90-130/48-60s. Patient was taking chlorthalidone 25 mg daily prior to admission.  -Metoprolol succinate 12.5 mg daily  Hyperlipidemia - High-dose statin  Signed, Lars Mage, MD  12/02/2017, 8:01 AM    I have seen, examined and evaluated the patient this AM on rounds along with Dr. Maricela Bo (Resident).  After reviewing all the available data and chart, we discussed the patients laboratory, study & physical findings as well as symptoms in detail. I agree with her findings, examination as well as impression recommendations as per our rounds discussion.    Has just started low-dose Toprol -first dose given this morning.  Would like to follow his blood pressure and heart rate response on the combination of Toprol and Imdur.  Not really able to titrate any further at this point.  At this point he is finally starting to progress.  I would like to see him stable one day on current dose  of Toprol.  If stable in the morning anticipate discharge home.  He will follow-up with Dr. Tamala Julian on discharge. Okay to transfer out of ICU.   Glenetta Hew, M.D., M.S. Interventional Cardiologist   Pager # [phone] Phone # [phone] [address]. [place], [place] [address]     For questions or updates, please contact [place] Please consult [email or web address] for contact info under

## 2017-12-02 NOTE — Progress Notes (Signed)
Spoke with MD Chundi about pt's current foley order. Pt voided in toilet, blood clots were present and post void bladder scan 0 mL. Pt claims that he is not having any problems voiding. Awaiting urology's input to assess if foley would be appropriate for pt. Will continue to monitor.

## 2017-12-03 ENCOUNTER — Inpatient Hospital Stay (HOSPITAL_COMMUNITY): Payer: Medicaid Other

## 2017-12-03 ENCOUNTER — Telehealth: Payer: Self-pay | Admitting: Interventional Cardiology

## 2017-12-03 DIAGNOSIS — R310 Gross hematuria: Secondary | ICD-10-CM

## 2017-12-03 DIAGNOSIS — R935 Abnormal findings on diagnostic imaging of other abdominal regions, including retroperitoneum: Secondary | ICD-10-CM

## 2017-12-03 DIAGNOSIS — I25110 Atherosclerotic heart disease of native coronary artery with unstable angina pectoris: Secondary | ICD-10-CM

## 2017-12-03 DIAGNOSIS — Z955 Presence of coronary angioplasty implant and graft: Secondary | ICD-10-CM

## 2017-12-03 LAB — BASIC METABOLIC PANEL
Anion gap: 10 (ref 5–15)
BUN: 9 mg/dL (ref 6–20)
CALCIUM: 8.6 mg/dL — AB (ref 8.9–10.3)
CO2: 24 mmol/L (ref 22–32)
CREATININE: 0.7 mg/dL (ref 0.61–1.24)
Chloride: 104 mmol/L (ref 98–111)
GFR calc Af Amer: >60 mL/min (ref >60)
GLUCOSE: 123 mg/dL — AB (ref 70–99)
Potassium: 3.7 mmol/L (ref 3.5–5.1)
Sodium: 138 mmol/L (ref 135–145)

## 2017-12-03 MED ORDER — FUROSEMIDE 10 MG/ML IJ SOLN
40.0000 mg | Freq: Once | INTRAMUSCULAR | Status: AC
  Administered 2017-12-03: 40 mg via INTRAVENOUS
  Filled 2017-12-03: qty 4

## 2017-12-03 MED ORDER — ISOSORBIDE MONONITRATE ER 30 MG PO TB24: 30.0000 mg | ORAL_TABLET | Freq: Every day | ORAL | 0 refills | Status: DC

## 2017-12-03 MED ORDER — IOHEXOL 300 MG/ML  SOLN
125.0000 mL | Freq: Once | INTRAMUSCULAR | Status: AC | PRN
  Administered 2017-12-03: 100 mL via INTRAVENOUS

## 2017-12-03 MED ORDER — METOPROLOL SUCCINATE ER 25 MG PO TB24: 12.5000 mg | ORAL_TABLET | Freq: Every day | ORAL | 0 refills | Status: DC

## 2017-12-03 MED ORDER — LOSARTAN POTASSIUM 25 MG PO TABS
12.5000 mg | ORAL_TABLET | Freq: Every day | ORAL | Status: DC
  Administered 2017-12-03: 12.5 mg via ORAL
  Filled 2017-12-03: qty 1

## 2017-12-03 MED ORDER — TICAGRELOR 90 MG PO TABS: 90.0000 mg | ORAL_TABLET | Freq: Two times a day (BID) | ORAL | 0 refills | Status: DC

## 2017-12-03 MED ORDER — ATORVASTATIN CALCIUM 80 MG PO TABS: 80.0000 mg | ORAL_TABLET | Freq: Every day | ORAL | 0 refills | Status: DC

## 2017-12-03 MED ORDER — LOSARTAN POTASSIUM 25 MG PO TABS: 12.5000 mg | ORAL_TABLET | Freq: Every day | ORAL | 0 refills | Status: DC

## 2017-12-03 MED ORDER — FINASTERIDE 5 MG PO TABS: 5.0000 mg | ORAL_TABLET | Freq: Every day | ORAL | 0 refills | Status: DC

## 2017-12-03 MED ORDER — ASPIRIN EC 81 MG PO TBEC: 81.0000 mg | DELAYED_RELEASE_TABLET | Freq: Every day | ORAL | Status: DC

## 2017-12-03 MED ORDER — NITROGLYCERIN 0.4 MG SL SUBL: 0.4000 mg | SUBLINGUAL_TABLET | SUBLINGUAL | 0 refills | Status: DC | PRN

## 2017-12-03 NOTE — Progress Notes (Signed)
CARDIAC REHAB PHASE I   Offered to walk with pt, pt declining, stating fatigue. Pts wife with lots of repeat questions. Again strongly encouraged smoking cessation not just for a couple of weeks but long term. Pt states he has been without a cigarette for 5 days, and is hopeful he can stay quit. Pts wife not grasping concept diet, exercise, and smoking cessation should be permanent lifestyle changes, and not just in the meantime. Pt and wife need continued reinforcement.  [id number] Rufina Falco, RN BSN 12/03/2017 10:44 AM

## 2017-12-03 NOTE — Discharge Summary (Addendum)
Discharge Summary    Patient ID: Darius [name] MRN: [id number]; DOB: Jun 30, 1955  Admit date: 11/30/2017 Discharge date: 12/03/2017  Primary Care Provider: Patient, No Pcp Per  Primary Cardiologist: No primary care provider on file.  Primary Electrophysiologist:  None   Discharge Diagnoses    Active Problems:   STEMI (ST elevation myocardial infarction) ([place])   Acute myocardial infarction ([place])   Gross Hematuria  Allergies No Known Allergies  Diagnostic Studies/Procedures    Left heart cath 11/30/2017  Inferior ST elevation myocardial infarction due to 100% occlusion of the proximal RCA. Successful therapy with proximal and distal vessel stenting reducing 100% and 80% stenoses to 10% and 0% respectively with improvement in TIMI flow from 0 to III. 22 x 4.0 mm Onyx stents were used with the proximal stent being postdilated to 4.5 mm in diameter.  Normal left main.  LAD contains segmental 60 to 70% mid vessel narrowing.  Large normal ramus intermedius.  Circumflex gives origin to 2 small obtuse marginal branches with a second marginal containing 60 to 70% diffuse disease in the mid vessel.  Inferobasal akinesis, overall LVEF greater than 60%. LVEDP 20.  Delayed reperfusion due to tortuous, angulated innominate/aortic arch anatomy.  Procedure complicated by Bezold-Jarisch response with profound hypotension and bradycardia requiring intravenous atropine and IV Levophed for heart rate and blood pressure support.  RECOMMENDATIONS:   IV fluid at a rate of 100 cc/h.  Wean Levophed once blood pressure is consistently above 100 mmHg.  Continue IV ReoPro for 8 hours.  Aspirin and Plavix dual antiplatelet therapy for 12 months.  High intensity statin therapy.  Institute beta-blocker therapy as tolerated by heart rate and blood pressure. Consider angiotensin converting enzyme inhibitor or angiotensin receptor blocker for blood pressure control if required. Patient  has been on diuretic therapy as monotherapy for hypertension.  Smoking cessation.   Recommend uninterrupted dual antiplatelet therapy with Aspirin 81mg  daily and Ticagrelor 90mg  twice dailyfor a minimum of 12 months (ACS - Class I recommendation).      Post-Intervention Diagram         TTE showed EF 60 to 65%, moderate LVH, inferior basal hypokinesis, dilated IVC. Troponin peaked to 27 and is now trending down.  _____________   History of Present Illness     62 y.o. male hypertension,40 ppy smoking history, and without any prior cardiac history who presented on 11/30/2017 post 45 minutes of substernal chest pain with accompanied diaphoresis, dyspnea, nausea. EKG found inferior STEMI (II,III,avf)and without any troponin elevation. Concern for urgent cath which found 100% occlusion of proximal RCA, LAD 60 to 70% narrowing in the mid vessel.2 drug-eluting stents were placed.  Hospital Course     Consultants: Urology  Inferior wall STEMIs/pcath and DES placement Patient presented with substernal chest pressure/tightness that was severe in nature in accompanied with nausea, dyspnea, and diaphoresis. Initial EKG showed STEMI in inferior leads (II, III, and avF). Patient was taken for emergent cath which showed  65% stenosis in proximal LAD, 70% stenosis in 2nd mrg, 100% stenosis in prox RCA, 80% stenosis in mid RCA, and 100% stenosis in post atrio lesion. Stents were placed successfully in prox and mid rca. Patient required aggrastat (tirofiban). Patient did well post operatively and was able to successfully work with cardiac rehab. He was started on atrovastatin 80mg  qd, imdur 30mg  qd, aspirin 81mg  qd, and brillinta 90mg  bid, losartan 12.5mg  qd, and metoprolol 12.5mg  qd. The patient's aspirin was held temporarily due to patient developing gross hematuria during  this admission. Smoking cessation counseling was given to both patient and his wife.   Patient echo showed lv ef  60-65%, inferobasal hypokinesis, dilated ivc consistent with elevated cvp, mildly thickened mitral leaflets. Patient was given iv lasix when hospitalized and discharged on po lasix 20mg  qd.   Hypertension Patient was taking chlorthalidone25 mg daily prior to admission. His blood pressure was closely managed during hospitalization and he will be discharged with metoprolol succinate 12.5 mg daily.   Hyperlipidemia Lipid panel: tcholes=179, trig=74, hdl=36, ldl=128.  -Continue atorvastatin 80 mg daily  Gross hematuria During hospitalization patient mentioned that he has been noticing darker colored urine and clots of blood in his urine.Urinalysis showed >50 rbc, large hgb and 30 protein. Urology was consulted and CT abdomen was done which showed as mass (3.6x3.8x4.0cm) on right side of urinary bladder adjacent to right ureterovesicular jxn that is concerning for neoplasm. Urology plans on having the patient follow up in their clinic for management and cystoscopy at later date.  _____________  Discharge Vitals Blood pressure 135/71, pulse 63, temperature (!) 97.4 F (36.3 C), temperature source Oral, resp. rate 20, height 5\' 9"  (1.753 m), weight 108.8 kg, SpO2 94 %.  Filed Weights   11/30/17 0121 11/30/17 0305 12/03/17 0441  Weight: 113.4 kg 111 kg 108.8 kg    Labs & Radiologic Studies    CBC Recent Labs    12/01/17 0239  WBC 16.2*  HGB 13.3  HCT 42.6  MCV 94.7  PLT 841   Basic Metabolic Panel Recent Labs    12/01/17 0239 12/03/17 0422  NA 136 138  K 4.3 3.7  CL 102 104  CO2 22 24  GLUCOSE 182* 123*  BUN 21* 9  CREATININE 1.07 0.70  CALCIUM 9.0 8.6*   Liver Function Tests No results for input(s): AST, ALT, ALKPHOS, BILITOT, PROT, ALBUMIN in the last 72 hours. No results for input(s): LIPASE, AMYLASE in the last 72 hours. Cardiac Enzymes Recent Labs    11/30/17 1429 11/30/17 2132 12/01/17 0239  TROPONINI 22.82* 27.55* 24.00*   BNP Invalid input(s):  POCBNP D-Dimer No results for input(s): DDIMER in the last 72 hours. Hemoglobin A1C No results for input(s): HGBA1C in the last 72 hours. Fasting Lipid Panel No results for input(s): CHOL, HDL, LDLCALC, TRIG, CHOLHDL, LDLDIRECT in the last 72 hours. Thyroid Function Tests No results for input(s): TSH, T4TOTAL, T3FREE, THYROIDAB in the last 72 hours.  Invalid input(s): FREET3 _____________  Dg Chest Port 1 View  Result Date: 11/30/2017 CLINICAL DATA:  Substernal chest pain and shortness of breath since midnight. EXAM: PORTABLE CHEST 1 VIEW COMPARISON:  None. FINDINGS: Shallow inspiration. Cardiac enlargement. No vascular congestion. Nodular interstitial pattern to the lungs. This could indicate granulomatous process or pneumoconiosis. No focal consolidation or airspace disease. No blunting of costophrenic angles. No pneumothorax. Mediastinal contours appear intact. Calcification of the aorta. IMPRESSION: Cardiac enlargement. Nonspecific nodular interstitial pattern to the lungs. No focal consolidation. Electronically Signed   By: Lucienne Capers M.D.   On: 11/30/2017 01:15   Ct Hematuria Workup  Result Date: 12/03/2017 CLINICAL DATA:  62 year old male with history of gross hematuria. History of hypertension. EXAM: CT ABDOMEN AND PELVIS WITHOUT AND WITH CONTRAST TECHNIQUE: Multidetector CT imaging of the abdomen and pelvis was performed following the standard protocol before and following the bolus administration of intravenous contrast. CONTRAST:  175mL OMNIPAQUE IOHEXOL 300 MG/ML  SOLN COMPARISON:  No priors. FINDINGS: Lower chest: Trace right pleural effusion lying dependently. Areas of subsegmental atelectasis  in the dependent portions of the lower lobes of the lungs bilaterally. Atherosclerotic calcifications in the right coronary artery. Hepatobiliary: Diffuse low attenuation throughout the visualized hepatic parenchyma, indicative of hepatic steatosis. No definite cystic or solid hepatic  lesions are confidently identified on today's noncontrast CT examination. Gallbladder is nearly completely decompressed, but otherwise unremarkable in appearance. Pancreas: No definite pancreatic mass or peripancreatic fluid or inflammatory changes are noted on today's noncontrast CT examination. Spleen: Unremarkable. Adrenals/Urinary Tract: In the right side of the urinary bladder adjacent to the right ureterovesicular junction there is a 3.6 x 3.8 x 4.0 cm soft tissue attenuation mass-like area concerning for potential urothelial neoplasm (axial image 79 of series 3 and coronal image 95 of series 4), poorly evaluated on today's noncontrast CT examination. Bilateral adrenal glands are normal in appearance. Stomach/Bowel: Normal appearance of the stomach. No pathologic dilatation of small bowel or colon. Normal appendix. Vascular/Lymphatic: Aortic atherosclerosis, without evidence of aneurysm or dissection in the abdominal or pelvic vasculature. No lymphadenopathy noted in the abdomen or pelvis. Reproductive: Prostate gland and seminal vesicles are unremarkable in appearance. Other: No significant volume of ascites.  No pneumoperitoneum. Musculoskeletal: There are no aggressive appearing lytic or blastic lesions noted in the visualized portions of the skeleton. IMPRESSION: 1. No urinary tract calculi. 2. 3.6 x 3.8 x 4.0 cm mass-like area in the right side of the urinary bladder adjacent to the right ureterovesicular junction, poorly evaluated on today's noncontrast CT examination. This finding is highly concerning for urothelial neoplasm, and could be further evaluated with hematuria protocol CT scan or cystoscopy if clinically appropriate. 3. Hepatic steatosis. Electronically Signed   By: Vinnie Langton M.D.   On: 12/03/2017 07:24   Disposition   Pt is being discharged home today in good condition.  Follow-up Plans & Appointments    Follow-up Information    [facility]  Follow up.   Why:  for your prescription needs. Discounted medications $4-$10 Contact information: [place] [address] [place]. Go on 12/23/2017.   Why:  at 10:50am for your hospital followu appointment. Contact information: [place] [address] [phone]       [name], PA-C Follow up on 12/14/2017.   Specialties:  Cardiology, Radiology Why:  Your follow up appointment will be on 12/14/17 at 0900am. Please arrive by 845am.  Contact information: [address] [place] [address] [phone]          Discharge Instructions    Amb Referral to Cardiac Rehabilitation   Complete by:  As directed    Diagnosis:   STEMI Coronary Stents        Discharge Medications   Allergies as of 12/03/2017   No Known Allergies     Medication List    STOP taking these medications   chlorthalidone 25 MG tablet Commonly known as:  HYGROTON     TAKE these medications   atorvastatin 80 MG tablet Commonly known as:  LIPITOR Take 1 tablet (80 mg total) by mouth daily at 6 PM.   finasteride 5 MG tablet Commonly known as:  PROSCAR Take 1 tablet (5 mg total) by mouth daily. Start taking on:  12/04/2017   isosorbide mononitrate 30 MG 24 hr tablet Commonly known as:  IMDUR Take 1 tablet (30 mg total) by mouth daily. Start taking on:  12/04/2017  losartan 25 MG tablet Commonly known as:  COZAAR Take 0.5 tablets (12.5 mg total) by mouth daily. Start taking on:  12/04/2017   metoprolol succinate 25 MG 24 hr tablet Commonly known as:  TOPROL-XL Take 0.5 tablets (12.5 mg total) by mouth daily. Start taking on:  12/04/2017   nitroGLYCERIN 0.4 MG SL tablet Commonly known as:  NITROSTAT Place 1 tablet (0.4 mg total) under the tongue every 5 (five) minutes x 3 doses as needed for up to 10 days for chest pain.    ticagrelor 90 MG Tabs tablet Commonly known as:  BRILINTA Take 1 tablet (90 mg total) by mouth 2 (two) times daily.        Acute coronary syndrome (MI, NSTEMI, STEMI, etc) this admission?: Yes.     AHA/ACC Clinical Performance & Quality Measures: 1. Aspirin prescribed? - No - Patient has gross hematuria 2. ADP Receptor Inhibitor (Plavix/Clopidogrel, Brilinta/Ticagrelor or Effient/Prasugrel) prescribed (includes medically managed patients)? - Yes 3. Beta Blocker prescribed? - Yes 4. High Intensity Statin (Lipitor 40-80mg  or Crestor 20-40mg ) prescribed? - Yes 5. EF assessed during THIS hospitalization? - Yes 6. For EF <40%, was ACEI/ARB prescribed? - Not Applicable (EF >/= 77%) 7. For EF <40%, Aldosterone Antagonist (Spironolactone or Eplerenone) prescribed? - Not Applicable (EF >/= 11%) 8. Cardiac Rehab Phase II ordered (Included Medically managed Patients)? - Yes     Outstanding Labs/Studies   Follow up with urology for possible cystoscopy  Duration of Discharge Encounter   Greater than 30 minutes including physician time.  Eusebio Friendly, MD 12/03/2017, 2:12 PM

## 2017-12-03 NOTE — Progress Notes (Addendum)
Progress Note  Patient Name: Darius [name] Date of Encounter: 12/03/2017  Primary Cardiologist: No primary care provider on file.   Subjective   [name] mentioned that he is doing well and has not had any chest pain or difficulty breathing. He has been noticing more blood clots in his urine over the past day.   Inpatient Medications    Scheduled Meds: . atorvastatin  80 mg Oral q1800  . finasteride  5 mg Oral Daily  . heparin  5,000 Units Subcutaneous Q8H  . isosorbide mononitrate  30 mg Oral Daily  . metoprolol succinate  12.5 mg Oral Daily  . sodium chloride flush  3 mL Intravenous Q12H  . ticagrelor  90 mg Oral BID   Continuous Infusions: . sodium chloride     PRN Meds: sodium chloride, acetaminophen, nitroGLYCERIN, ondansetron (ZOFRAN) IV, oxyCODONE, sodium chloride flush   Vital Signs    Vitals:   12/02/17 1629 12/02/17 1716 12/02/17 2028 12/03/17 0441  BP:  (!) 136/56 (!) 126/53 122/86  Pulse:  68 68 63  Resp:   17 20  Temp: (!) 97.4 F (36.3 C) 97.7 F (36.5 C) 100.3 F (37.9 C) (!) 97.4 F (36.3 C)  TempSrc: Oral Oral Oral Oral  SpO2:  93% 90% 94%  Weight:    108.8 kg  Height:        Intake/Output Summary (Last 24 hours) at 12/03/2017 0735 Last data filed at 12/03/2017 0500 Gross per 24 hour  Intake 480 ml  Output 575 ml  Net -95 ml   Filed Weights   11/30/17 0121 11/30/17 0305 12/03/17 0441  Weight: 113.4 kg 111 kg 108.8 kg    Telemetry    NSR- Personally Reviewed  ECG    NSR, t wave inversions in II, III, and avF- Personally Reviewed  Physical Exam   Physical Exam  Constitutional: Appears well-developed and well-nourished. No distress.  HENT:  Head: Normocephalic and atraumatic.  Eyes: Conjunctivae are normal.  Cardiovascular: Normal rate, regular rhythm and normal heart sounds.  Respiratory: Effort normal and breath sounds normal. No respiratory distress. No wheezes.  GI: Soft. Bowel sounds are normal. No distension.  There is no tenderness.  Musculoskeletal: No edema.  Neurological: Is alert.  Skin: Not diaphoretic. No erythema.  Psychiatric: Normal mood and affect. Behavior is normal. Judgment and thought content normal.    Labs    Chemistry Recent Labs  Lab 11/30/17 0049  11/30/17 0916 12/01/17 0239 12/03/17 0422  NA 132*   < > 137 136 138  K 3.0*   < > 4.3 4.3 3.7  CL 101   < > 105 102 104  CO2 17*  --  19* 22 24  GLUCOSE 177*   < > 172* 182* 123*  BUN 15   < > 18 21* 9  CREATININE 0.69   < > 0.86 1.07 0.70  CALCIUM 9.0  --  8.9 9.0 8.6*  PROT 7.7  --   --   --   --   ALBUMIN 4.0  --   --   --   --   AST 23  --   --   --   --   ALT QUANTITY NOT SUFFICIENT, UNABLE TO PERFORM TEST  --   --   --   --   ALKPHOS 67  --   --   --   --   BILITOT 0.8  --   --   --   --  GFRNONAA >60   < > >60 >60 >60  GFRAA >60   < > >60 >60 >60  ANIONGAP 14  --  13 12 10    < > = values in this interval not displayed.     Hematology Recent Labs  Lab 11/30/17 0346 11/30/17 0916 12/01/17 0239  WBC 20.3* 14.8* 16.2*  RBC 5.00 4.65 4.50  HGB 15.2 14.1 13.3  HCT 47.1 43.7 42.6  MCV 94.2 94.0 94.7  MCH 30.4 30.3 29.6  MCHC 32.3 32.3 31.2  RDW 13.4 13.7 13.8  PLT 312 259 257    Cardiac Enzymes Recent Labs  Lab 11/30/17 0916 11/30/17 1429 11/30/17 2132 12/01/17 0239  TROPONINI 10.37* 22.82* 27.55* 24.00*    Recent Labs  Lab 11/30/17 0054  TROPIPOC 0.00     BNPNo results for input(s): BNP, PROBNP in the last 168 hours.   DDimer No results for input(s): DDIMER in the last 168 hours.   Radiology    Ct Hematuria Workup  Result Date: 12/03/2017 CLINICAL DATA:  62 year old male with history of gross hematuria. History of hypertension. EXAM: CT ABDOMEN AND PELVIS WITHOUT AND WITH CONTRAST TECHNIQUE: Multidetector CT imaging of the abdomen and pelvis was performed following the standard protocol before and following the bolus administration of intravenous contrast. CONTRAST:  12mL  OMNIPAQUE IOHEXOL 300 MG/ML  SOLN COMPARISON:  No priors. FINDINGS: Lower chest: Trace right pleural effusion lying dependently. Areas of subsegmental atelectasis in the dependent portions of the lower lobes of the lungs bilaterally. Atherosclerotic calcifications in the right coronary artery. Hepatobiliary: Diffuse low attenuation throughout the visualized hepatic parenchyma, indicative of hepatic steatosis. No definite cystic or solid hepatic lesions are confidently identified on today's noncontrast CT examination. Gallbladder is nearly completely decompressed, but otherwise unremarkable in appearance. Pancreas: No definite pancreatic mass or peripancreatic fluid or inflammatory changes are noted on today's noncontrast CT examination. Spleen: Unremarkable. Adrenals/Urinary Tract: In the right side of the urinary bladder adjacent to the right ureterovesicular junction there is a 3.6 x 3.8 x 4.0 cm soft tissue attenuation mass-like area concerning for potential urothelial neoplasm (axial image 79 of series 3 and coronal image 95 of series 4), poorly evaluated on today's noncontrast CT examination. Bilateral adrenal glands are normal in appearance. Stomach/Bowel: Normal appearance of the stomach. No pathologic dilatation of small bowel or colon. Normal appendix. Vascular/Lymphatic: Aortic atherosclerosis, without evidence of aneurysm or dissection in the abdominal or pelvic vasculature. No lymphadenopathy noted in the abdomen or pelvis. Reproductive: Prostate gland and seminal vesicles are unremarkable in appearance. Other: No significant volume of ascites.  No pneumoperitoneum. Musculoskeletal: There are no aggressive appearing lytic or blastic lesions noted in the visualized portions of the skeleton. IMPRESSION: 1. No urinary tract calculi. 2. 3.6 x 3.8 x 4.0 cm mass-like area in the right side of the urinary bladder adjacent to the right ureterovesicular junction, poorly evaluated on today's noncontrast CT  examination. This finding is highly concerning for urothelial neoplasm, and could be further evaluated with hematuria protocol CT scan or cystoscopy if clinically appropriate. 3. Hepatic steatosis. Electronically Signed   By: Vinnie Langton M.D.   On: 12/03/2017 07:24    Cardiac Studies   Left heart cath 11/30/2017  Inferior ST elevation myocardial infarction due to 100% occlusion of the proximal RCA. Successful therapy with proximal and distal vessel stenting reducing 100% and 80% stenoses to 10% and 0% respectively with improvement in TIMI flow from 0 to III. 22 x 4.0 mm Onyx stents were used  with the proximal stent being postdilated to 4.5 mm in diameter.  Normal left main.  LAD contains segmental 60 to 70% mid vessel narrowing.  Large normal ramus intermedius.  Circumflex gives origin to 2 small obtuse marginal branches with a second marginal containing 60 to 70% diffuse disease in the mid vessel.  Inferobasal akinesis, overall LVEF greater than 60%. LVEDP 20.  Delayed reperfusion due to tortuous, angulated innominate/aortic arch anatomy.  Procedure complicated by Bezold-Jarisch response with profound hypotension and bradycardia requiring intravenous atropine and IV Levophed for heart rate and blood pressure support.  RECOMMENDATIONS:   IV fluid at a rate of 100 cc/h.  Wean Levophed once blood pressure is consistently above 100 mmHg.  Continue IV ReoPro for 8 hours.  Aspirin and Plavix dual antiplatelet therapy for 12 months.  High intensity statin therapy.  Institute beta-blocker therapy as tolerated by heart rate and blood pressure. Consider angiotensin converting enzyme inhibitor or angiotensin receptor blocker for blood pressure control if required. Patient has been on diuretic therapy as monotherapy for hypertension.  Smoking cessation.   Recommend uninterrupted dual antiplatelet therapy with Aspirin 81mg  daily and Ticagrelor 90mg  twice dailyfor a minimum  of 12 months (ACS - Class I recommendation).      Post-Intervention Diagram         TTE showed EF 60 to 65%, moderate LVH, inferior basal hypokinesis, dilated IVC. Troponin peaked to 27 and is now trending down.  Patient Profile     62 y.o. male hypertension,40 ppy smoking history, and without any prior cardiac history who presented on 11/30/2017 post 45 minutes of substernal chest pain with accompanied diaphoresis, dyspnea, nausea. EKG found inferior STEMI (II,III,avf)and without any troponin elevation. Concern for urgent cath which found 100% occlusion of proximal RCA, LAD 60 to 70% narrowing in the mid vessel.2 drug-eluting stents were placed.  Assessment & Plan    Inferior wall STEMIs/pcath and DES placement Patient has been working with cardiac rehab.  He has not had any difficulty walking down hallways.  He will be referred to cardiac rehab phase 2.  Patient's pressures ranging 120-130/50-80s with rates 60-70s.  -Continue atorvastatin 80 mg daily -Continue imdur 30mg  qd -Continue Brillinta  -Hold aspirin till hematuria stops -Metoprolol succinate 12.5 mg daily -Started losartan 12.5mg  qd -Smoking cessation counseling given  Hypertension Patient's blood pressure over the past few hours has 120-130/50-80s. Patient was taking chlorthalidone25 mg daily prior to admission.  -Metoprolol succinate 12.5 mg daily  Hyperlipidemia  -Continue atorvastatin 80 mg daily  Gross hematuria Urinalysis showed >50 rbc, large hgb and 30 protein. Was seen by urology who recommended starting finasteride for prostatic bleeding. CT abdomen showed mass on right side of urinary bladder adjacent to right ureterovesicular jxn that is concerning for neoplasm. Patient will need cystoscopy which will be done by urology outpatient.   -Continue finasteride  -Hold aspirin till bleed stops -will wait for urology recommendations    For questions or updates, please contact [place] Please consult [email or web address] for contact info under        Signed, Lars Mage, MD  12/03/2017, 7:35 AM

## 2017-12-03 NOTE — Discharge Instructions (Addendum)

## 2017-12-03 NOTE — Progress Notes (Signed)
Pt has continued hematuria but feels that he is emptying.  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 No evidence of extravesical disease/adenopathy.  I have discussed probable dx with the patient and his wife as well as the need for outpt office cysto and eventual transurethral resection of bladder tumor.  I am fine letting pt go home if he is voiding well. He may need a catheter eventually if he develops clot retention but will try to go w/o one for now.  We must weigh possible progression of cancer if there is a delay in Rx/resection as well as persistent blood loss with hematuria vs. Him coming off of anti-plt therapy and subsequent cardiac issues. For now I feel it is not an urgent matter to wait for some time to perform resection. We will call him for OV and I would appreciate Cardiologist input as to when would be the soonest he would be able to come off of anti-plt therpy to have a procedure which would entail 4-5 days of med abstinence.  25 minutes spent with pt and wife face to face.

## 2017-12-03 NOTE — Telephone Encounter (Signed)
°  TOC appt requested by Kathyrn Drown - NP   Appt made 12/2 with [facility]

## 2017-12-03 NOTE — Care Management (Signed)
[facility] utilized for medications. Patient is without Insurance at this time. [facility] will deliver medications to the bedside. Hospital Follow Up via PCP scheduled by previous Case Manager. No further needs from this CM. Bethena Roys, RN,BSN Case Manager [phone]

## 2017-12-04 ENCOUNTER — Telehealth (HOSPITAL_COMMUNITY): Payer: Self-pay

## 2017-12-04 NOTE — Telephone Encounter (Signed)
Called pt in regards to CR, pt stated that he does not have any insurance at this time. And CR is not a program he would like to join at this time.  Closed referral

## 2017-12-04 NOTE — Telephone Encounter (Signed)
**Note De-Identified Darius [name] Obfuscation** Patient contacted regarding discharge from [facility] on 12/03/17.  Patient understands to follow up with provider [name], PA-c on 12/14/17 at 9:00 at [facility] in [place]. Patient understands discharge instructions? Yes Patient understands medications and regiment? Yes Patient understands to bring all medications to this visit? Yes

## 2017-12-05 ENCOUNTER — Other Ambulatory Visit: Payer: Self-pay

## 2017-12-05 ENCOUNTER — Encounter (HOSPITAL_COMMUNITY): Payer: Self-pay | Admitting: Emergency Medicine

## 2017-12-05 ENCOUNTER — Emergency Department (HOSPITAL_COMMUNITY)
Admission: EM | Admit: 2017-12-05 | Discharge: 2017-12-05 | Disposition: A | Discharge status: Home / Self Care | Payer: Medicaid Other | Attending: Emergency Medicine | Admitting: Emergency Medicine

## 2017-12-05 DIAGNOSIS — R310 Gross hematuria: Secondary | ICD-10-CM | POA: Diagnosis present

## 2017-12-05 DIAGNOSIS — R339 Retention of urine, unspecified: Secondary | ICD-10-CM | POA: Diagnosis not present

## 2017-12-05 DIAGNOSIS — I252 Old myocardial infarction: Secondary | ICD-10-CM | POA: Diagnosis not present

## 2017-12-05 DIAGNOSIS — I10 Essential (primary) hypertension: Secondary | ICD-10-CM | POA: Insufficient documentation

## 2017-12-05 DIAGNOSIS — D414 Neoplasm of uncertain behavior of bladder: Secondary | ICD-10-CM | POA: Diagnosis not present

## 2017-12-05 LAB — BASIC METABOLIC PANEL
Anion gap: 8 (ref 5–15)
BUN: 12 mg/dL (ref 6–20)
CALCIUM: 9.1 mg/dL (ref 8.9–10.3)
CO2: 25 mmol/L (ref 22–32)
CREATININE: 0.79 mg/dL (ref 0.61–1.24)
Chloride: 106 mmol/L (ref 98–111)
GFR calc non Af Amer: >60 mL/min (ref >60)
Glucose, Bld: 138 mg/dL — ABNORMAL HIGH (ref 70–99)
Potassium: 3.8 mmol/L (ref 3.5–5.1)
SODIUM: 139 mmol/L (ref 135–145)

## 2017-12-05 LAB — URINALYSIS, ROUTINE W REFLEX MICROSCOPIC

## 2017-12-05 LAB — CBC WITH DIFFERENTIAL/PLATELET
Abs Immature Granulocytes: 0.03 10*3/uL (ref 0.00–0.07)
BASOS ABS: 0 10*3/uL (ref 0.0–0.1)
Basophils Relative: 0 %
EOS ABS: 0.2 10*3/uL (ref 0.0–0.5)
EOS PCT: 2 %
HEMATOCRIT: 39.9 % (ref 39.0–52.0)
HEMOGLOBIN: 13.3 g/dL (ref 13.0–17.0)
Immature Granulocytes: 0 %
LYMPHS ABS: 1.9 10*3/uL (ref 0.7–4.0)
LYMPHS PCT: 20 %
MCH: 30.4 pg (ref 26.0–34.0)
MCHC: 33.3 g/dL (ref 30.0–36.0)
MCV: 91.3 fL (ref 80.0–100.0)
MONO ABS: 0.7 10*3/uL (ref 0.1–1.0)
Monocytes Relative: 7 %
NRBC: 0 % (ref 0.0–0.2)
Neutro Abs: 6.5 10*3/uL (ref 1.7–7.7)
Neutrophils Relative %: 71 %
Platelets: 246 10*3/uL (ref 150–400)
RBC: 4.37 MIL/uL (ref 4.22–5.81)
RDW: 13.2 % (ref 11.5–15.5)
WBC: 9.3 10*3/uL (ref 4.0–10.5)

## 2017-12-05 LAB — URINALYSIS, MICROSCOPIC (REFLEX): RBC / HPF: >50 RBC/hpf (ref 0–5)

## 2017-12-05 MED ORDER — LIDOCAINE HCL URETHRAL/MUCOSAL 2 % EX GEL
1.0000 "application " | Freq: Once | CUTANEOUS | Status: AC | PRN
  Administered 2017-12-05: 1 via URETHRAL
  Filled 2017-12-05: qty 5

## 2017-12-05 MED ORDER — LIDOCAINE HCL URETHRAL/MUCOSAL 2 % EX GEL
1.0000 "application " | Freq: Once | CUTANEOUS | Status: DC | PRN
  Filled 2017-12-05: qty 30

## 2017-12-05 NOTE — ED Triage Notes (Signed)
Patient presents to the ED with complaints of oliguria and hematuria. Patient has a STEMI about a week ago. Patient noted blood in his urine while hospitalized but was told due to the blood thinners he was on there was nothing they could do. Patient states after discharge he has had increased difficulty urinating. Patient states difficulty starting and when he does it is bloody with clots. Patient states some lower abdominal discomfort, and believes he last fully emptied his bladder yesterday evening.

## 2017-12-05 NOTE — ED Notes (Signed)
Leg-bag applied. He thanks [place] for our care. His daughter is with him.

## 2017-12-05 NOTE — ED Provider Notes (Signed)
[facility] DEPT Provider Note  CSN: [id number] Arrival date & time: 12/05/17 0353  Chief Complaint(s) Hematuria and Oliguria  HPI Darius [name] is a 62 y.o. male who recently had STEMI requiring stenting who was placed on Brilinta presents to the emergency department for hematuria and urinary retention.  During his hospitalization, patient was noted to have hematuria.  CT of the abdomen revealed a bladder mass concerning for neoplasm.  Since the patient was voiding no further intervention was taken.  He was seen by urology at that time who is supposed to schedule outpatient follow-up.  Patient presents today for urinary retention stating that he is has lower abdominal distention and aching.  States that he last voided sometime last night.  However states that he is been having intermittent difficulty completely voiding.  States that he is having clots during voids.  Denies any nausea or vomiting.  Currently denies any chest pain or shortness of breath.  HPI   Past Medical History Past Medical History:  Diagnosis Date  . Hypertension    Patient Active Problem List   Diagnosis Date Noted  . STEMI (ST elevation myocardial infarction) ([place]) 11/30/2017  . Acute myocardial infarction ([place]) 11/30/2017  . 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 HTN (hypertension) 08/06/2015   Home Medication(s) Prior to Admission medications   Medication Sig Start Date End Date Taking? Authorizing Provider  atorvastatin (LIPITOR) 80 MG tablet Take 1 tablet (80 mg total) by mouth daily at 6 PM. 12/03/17 03/03/18  Chundi, Verne Spurr, MD  finasteride (PROSCAR) 5 MG tablet Take 1 tablet (5 mg total) by mouth daily. 12/04/17 01/03/18  Lars Mage, MD  isosorbide mononitrate (IMDUR) 30 MG 24 hr tablet Take 1 tablet (30 mg total) by mouth daily. 12/04/17 03/04/18  Lars Mage, MD  losartan (COZAAR) 25 MG tablet Take 0.5 tablets (12.5 mg total) by mouth daily. 12/04/17 03/04/18  Lars Mage, MD  metoprolol succinate (TOPROL-XL) 25 MG 24 hr tablet Take 0.5 tablets (12.5 mg total) by mouth daily. 12/04/17 03/04/18  Lars Mage, MD  nitroGLYCERIN (NITROSTAT) 0.4 MG SL tablet Place 1 tablet (0.4 mg total) under the tongue every 5 (five) minutes x 3 doses as needed for up to 10 days for chest pain. 12/03/17 12/13/17  Lars Mage, MD  ticagrelor (BRILINTA) 90 MG TABS tablet Take 1 tablet (90 mg total) by mouth 2 (two) times daily. 12/03/17 03/03/18  Lars Mage, MD                                                                                                                                    Past Surgical History Past Surgical History:  Procedure Laterality Date  . CORONARY/GRAFT ACUTE MI REVASCULARIZATION N/A 11/30/2017   Procedure: Coronary/Graft Acute MI Revascularization;  Surgeon: Belva Crome, MD;  Location: [facility];  Service: Cardiovascular;  Laterality: N/A;  . LEFT HEART CATH AND CORONARY ANGIOGRAPHY  N/A 11/30/2017   Procedure: LEFT HEART CATH AND CORONARY ANGIOGRAPHY;  Surgeon: Belva Crome, MD;  Location: [facility];  Service: Cardiovascular;  Laterality: N/A;   Family History No family history on file.  Social History Social History   Tobacco Use  . Smoking status: Current Every Day Smoker  . Smokeless tobacco: Never Used  Substance Use Topics  . Alcohol use: No  . Drug use: No   Allergies Patient has no known allergies.  Review of Systems Review of Systems All other systems are reviewed and are negative for acute change except as noted in the HPI  Physical Exam Vital Signs  I have reviewed the triage vital signs BP 136/77 (BP Location: Left Arm)   Pulse 62   Temp 97.6 F (36.4 C) (Oral)   Resp 20   SpO2 96%   Physical Exam  Constitutional: He is oriented to person, place, and time. He appears well-developed and well-nourished. No distress.  HENT:  Head: Normocephalic and atraumatic.  Right Ear: External ear  normal.  Left Ear: External ear normal.  Nose: Nose normal.  Mouth/Throat: Mucous membranes are normal. No trismus in the jaw.  Eyes: Conjunctivae and EOM are normal. No scleral icterus.  Neck: Normal range of motion and phonation normal.  Cardiovascular: Normal rate and regular rhythm.  Pulmonary/Chest: Effort normal. No stridor. No respiratory distress.  Abdominal: He exhibits no distension. There is no tenderness. There is no rigidity, no rebound and no guarding.  Musculoskeletal: Normal range of motion. He exhibits no edema.  Neurological: He is alert and oriented to person, place, and time.  Skin: He is not diaphoretic.  Psychiatric: He has a normal mood and affect. His behavior is normal.  Vitals reviewed.   ED Results and Treatments Labs (all labs ordered are listed, but only abnormal results are displayed) Labs Reviewed  URINALYSIS, ROUTINE W REFLEX MICROSCOPIC - Abnormal; Notable for the following components:      Result Value   Color, Urine RED (*)    APPearance TURBID (*)    Glucose, UA   (*)    Value: TEST NOT REPORTED DUE TO COLOR INTERFERENCE OF URINE PIGMENT   Hgb urine dipstick   (*)    Value: TEST NOT REPORTED DUE TO COLOR INTERFERENCE OF URINE PIGMENT   Bilirubin Urine   (*)    Value: TEST NOT REPORTED DUE TO COLOR INTERFERENCE OF URINE PIGMENT   Ketones, ur   (*)    Value: TEST NOT REPORTED DUE TO COLOR INTERFERENCE OF URINE PIGMENT   Protein, ur   (*)    Value: TEST NOT REPORTED DUE TO COLOR INTERFERENCE OF URINE PIGMENT   Nitrite   (*)    Value: TEST NOT REPORTED DUE TO COLOR INTERFERENCE OF URINE PIGMENT   Leukocytes, UA   (*)    Value: TEST NOT REPORTED DUE TO COLOR INTERFERENCE OF URINE PIGMENT   All other components within normal limits  BASIC METABOLIC PANEL - Abnormal; Notable for the following components:   Glucose, Bld 138 (*)    All other components within normal limits  URINALYSIS, MICROSCOPIC (REFLEX) - Abnormal; Notable for the following  components:   Bacteria, UA FEW (*)    All other components within normal limits  CBC WITH DIFFERENTIAL/PLATELET  EKG  EKG Interpretation  Date/Time:    Ventricular Rate:    PR Interval:    QRS Duration:   QT Interval:    QTC Calculation:   R Axis:     Text Interpretation:        Radiology No results found. Pertinent labs & imaging results that were available during my care of the patient were reviewed by me and considered in my medical decision making (see chart for details).  Medications Ordered in ED Medications  lidocaine (XYLOCAINE) 2 % jelly 1 application (1 application Urethral Given 12/05/17 0612)                                                                                                                                    Procedures Procedures EMERGENCY DEPARTMENT ULTRASOUND  Study: Limited Ultrasound of Bladder  INDICATIONS: to assess for urinary retention and/or bladder volume prior to urinary catheter Multiple views of the bladder were obtained in real-time in the transverse and longitudinal planes with a multi-frequency probe.  PERFORMED BY: Myself IMAGES ARCHIVED?: Yes LIMITATIONS:  none INTERPRETATION: Moderate Volume and large hyperechoic mass posteriorly  (including critical care time)  Medical Decision Making / ED Course I have reviewed the nursing notes for this encounter and the patient's prior records (if available in EHR or on provided paperwork).    Urinary retention and hematuria in the setting of likely bladder cancer in a patient who recently had ST segment elevation MI currently on Brilinta.  Bedside ultrasound revealed approximately 350 cc in the bladder.  Foley was placed.  Screening labs reassuring with stable hemoglobin.  No renal insufficiency.  Patient instructed to follow-up with urology.  The patient appears  reasonably screened and/or stabilized for discharge and I doubt any other medical condition or other [facility] requiring further screening, evaluation, or treatment in the ED at this time prior to discharge.  The patient is safe for discharge with strict return precautions.   Final Clinical Impression(s) / ED Diagnoses Final diagnoses:  Gross hematuria  Urinary retention   Disposition: Discharge  Condition: Good  I have discussed the results, Dx and Tx plan with the patient who expressed understanding and agree(s) with the plan. Discharge instructions discussed at great length. The patient was given strict return precautions who verbalized understanding of the instructions. No further questions at time of discharge.    ED Discharge Orders    None       Follow Up: Franchot Gallo, MD [place] [id number] [phone]  Schedule an appointment as soon as possible for a visit  For close follow up to assess for urinary retention and hematuria      This chart was dictated using voice recognition software.  Despite best efforts to proofread,  errors can occur which can change the documentation meaning.   Fatima Blank, MD 12/05/17 [phone]

## 2017-12-09 ENCOUNTER — Inpatient Hospital Stay (HOSPITAL_COMMUNITY)
Admission: AD | Admit: 2017-12-09 | Discharge: 2017-12-17 | DRG: 668 | Disposition: A | Discharge status: Home / Self Care | Payer: Medicaid Other | Attending: Urology | Admitting: Urology

## 2017-12-09 ENCOUNTER — Encounter (HOSPITAL_COMMUNITY): Payer: Self-pay | Admitting: *Deleted

## 2017-12-09 ENCOUNTER — Other Ambulatory Visit: Payer: Self-pay

## 2017-12-09 DIAGNOSIS — Z79899 Other long term (current) drug therapy: Secondary | ICD-10-CM | POA: Diagnosis not present

## 2017-12-09 DIAGNOSIS — E669 Obesity, unspecified: Secondary | ICD-10-CM | POA: Diagnosis present

## 2017-12-09 DIAGNOSIS — D62 Acute posthemorrhagic anemia: Secondary | ICD-10-CM | POA: Diagnosis present

## 2017-12-09 DIAGNOSIS — I2510 Atherosclerotic heart disease of native coronary artery without angina pectoris: Secondary | ICD-10-CM | POA: Diagnosis present

## 2017-12-09 DIAGNOSIS — C672 Malignant neoplasm of lateral wall of bladder: Secondary | ICD-10-CM | POA: Diagnosis present

## 2017-12-09 DIAGNOSIS — F17200 Nicotine dependence, unspecified, uncomplicated: Secondary | ICD-10-CM | POA: Diagnosis present

## 2017-12-09 DIAGNOSIS — N3289 Other specified disorders of bladder: Secondary | ICD-10-CM | POA: Diagnosis present

## 2017-12-09 DIAGNOSIS — R0902 Hypoxemia: Secondary | ICD-10-CM

## 2017-12-09 DIAGNOSIS — R310 Gross hematuria: Secondary | ICD-10-CM | POA: Diagnosis present

## 2017-12-09 DIAGNOSIS — Z955 Presence of coronary angioplasty implant and graft: Secondary | ICD-10-CM | POA: Diagnosis not present

## 2017-12-09 DIAGNOSIS — I2119 ST elevation (STEMI) myocardial infarction involving other coronary artery of inferior wall: Secondary | ICD-10-CM | POA: Diagnosis present

## 2017-12-09 DIAGNOSIS — I25118 Atherosclerotic heart disease of native coronary artery with other forms of angina pectoris: Secondary | ICD-10-CM | POA: Diagnosis not present

## 2017-12-09 DIAGNOSIS — D494 Neoplasm of unspecified behavior of bladder: Secondary | ICD-10-CM | POA: Diagnosis not present

## 2017-12-09 DIAGNOSIS — R001 Bradycardia, unspecified: Secondary | ICD-10-CM | POA: Diagnosis not present

## 2017-12-09 DIAGNOSIS — I237 Postinfarction angina: Secondary | ICD-10-CM | POA: Diagnosis not present

## 2017-12-09 DIAGNOSIS — I10 Essential (primary) hypertension: Secondary | ICD-10-CM | POA: Diagnosis present

## 2017-12-09 DIAGNOSIS — Z6834 Body mass index (BMI) 34.0-34.9, adult: Secondary | ICD-10-CM

## 2017-12-09 HISTORY — DX: Atherosclerotic heart disease of native coronary artery without angina pectoris: I25.10

## 2017-12-09 LAB — CBC WITH DIFFERENTIAL/PLATELET
Abs Immature Granulocytes: 0.03 10*3/uL (ref 0.00–0.07)
Basophils Absolute: 0 10*3/uL (ref 0.0–0.1)
Basophils Relative: 0 %
Eosinophils Absolute: 0.2 10*3/uL (ref 0.0–0.5)
Eosinophils Relative: 2 %
HCT: 26.6 % — ABNORMAL LOW (ref 39.0–52.0)
Hemoglobin: 8.6 g/dL — ABNORMAL LOW (ref 13.0–17.0)
Immature Granulocytes: 0 %
Lymphocytes Relative: 24 %
Lymphs Abs: 2 10*3/uL (ref 0.7–4.0)
MCH: 31.2 pg (ref 26.0–34.0)
MCHC: 32.3 g/dL (ref 30.0–36.0)
MCV: 96.4 fL (ref 80.0–100.0)
Monocytes Absolute: 0.7 10*3/uL (ref 0.1–1.0)
Monocytes Relative: 9 %
Neutro Abs: 5.2 10*3/uL (ref 1.7–7.7)
Neutrophils Relative %: 65 %
Platelets: 274 10*3/uL (ref 150–400)
RBC: 2.76 MIL/uL — ABNORMAL LOW (ref 4.22–5.81)
RDW: 13.5 % (ref 11.5–15.5)
WBC: 8.1 10*3/uL (ref 4.0–10.5)
nRBC: 0 % (ref 0.0–0.2)

## 2017-12-09 LAB — BASIC METABOLIC PANEL
Anion gap: 9 (ref 5–15)
BUN: 11 mg/dL (ref 6–20)
CO2: 23 mmol/L (ref 22–32)
CREATININE: 0.81 mg/dL (ref 0.61–1.24)
Calcium: 8.2 mg/dL — ABNORMAL LOW (ref 8.9–10.3)
Chloride: 106 mmol/L (ref 98–111)
GFR calc Af Amer: >60 mL/min (ref >60)
GFR calc non Af Amer: >60 mL/min (ref >60)
GLUCOSE: 108 mg/dL — AB (ref 70–99)
Potassium: 3.5 mmol/L (ref 3.5–5.1)
Sodium: 138 mmol/L (ref 135–145)

## 2017-12-09 MED ORDER — ACETAMINOPHEN 325 MG PO TABS: 650.0000 mg | ORAL_TABLET | ORAL | Status: DC | PRN

## 2017-12-09 MED ORDER — SODIUM CHLORIDE 0.9 % IR SOLN
3000.0000 mL | Status: DC
  Administered 2017-12-09 – 2017-12-17 (×102): 3000 mL

## 2017-12-09 MED ORDER — DIPHENHYDRAMINE HCL 12.5 MG/5ML PO ELIX: 12.5000 mg | ORAL_SOLUTION | Freq: Four times a day (QID) | ORAL | Status: DC | PRN

## 2017-12-09 MED ORDER — TICAGRELOR 90 MG PO TABS
90.0000 mg | ORAL_TABLET | Freq: Two times a day (BID) | ORAL | Status: DC
  Administered 2017-12-09: 90 mg via ORAL
  Filled 2017-12-09 (×2): qty 1

## 2017-12-09 MED ORDER — METOPROLOL SUCCINATE ER 25 MG PO TB24
12.5000 mg | ORAL_TABLET | Freq: Every day | ORAL | Status: DC
  Administered 2017-12-10: 12.5 mg via ORAL
  Filled 2017-12-09 (×2): qty 1

## 2017-12-09 MED ORDER — FINASTERIDE 5 MG PO TABS
5.0000 mg | ORAL_TABLET | Freq: Every day | ORAL | Status: DC
  Administered 2017-12-10 – 2017-12-17 (×7): 5 mg via ORAL
  Filled 2017-12-09 (×7): qty 1

## 2017-12-09 MED ORDER — DIPHENHYDRAMINE HCL 50 MG/ML IJ SOLN: 12.5000 mg | Freq: Four times a day (QID) | INTRAMUSCULAR | Status: DC | PRN

## 2017-12-09 MED ORDER — ISOSORBIDE MONONITRATE ER 30 MG PO TB24
30.0000 mg | ORAL_TABLET | Freq: Every day | ORAL | Status: DC
  Administered 2017-12-10 – 2017-12-17 (×7): 30 mg via ORAL
  Filled 2017-12-09 (×7): qty 1

## 2017-12-09 MED ORDER — NITROGLYCERIN 0.4 MG SL SUBL: 0.4000 mg | SUBLINGUAL_TABLET | SUBLINGUAL | Status: DC | PRN

## 2017-12-09 MED ORDER — MORPHINE SULFATE (PF) 2 MG/ML IV SOLN
2.0000 mg | Freq: Once | INTRAVENOUS | Status: AC
  Administered 2017-12-09: 2 mg via INTRAVENOUS
  Filled 2017-12-09: qty 1

## 2017-12-09 MED ORDER — ATORVASTATIN CALCIUM 40 MG PO TABS
80.0000 mg | ORAL_TABLET | Freq: Every day | ORAL | Status: DC
  Administered 2017-12-10 – 2017-12-15 (×6): 80 mg via ORAL
  Filled 2017-12-09 (×6): qty 2

## 2017-12-09 MED ORDER — BELLADONNA ALKALOIDS-OPIUM 16.2-60 MG RE SUPP
1.0000 | Freq: Four times a day (QID) | RECTAL | Status: DC | PRN
  Administered 2017-12-12 – 2017-12-15 (×2): 1 via RECTAL
  Filled 2017-12-09 (×7): qty 1

## 2017-12-09 MED ORDER — ONDANSETRON HCL 4 MG/2ML IJ SOLN
4.0000 mg | INTRAMUSCULAR | Status: DC | PRN
  Administered 2017-12-16: 4 mg via INTRAVENOUS
  Filled 2017-12-09: qty 2

## 2017-12-09 MED ORDER — LOSARTAN POTASSIUM 25 MG PO TABS
12.5000 mg | ORAL_TABLET | Freq: Every day | ORAL | Status: DC
  Administered 2017-12-10 – 2017-12-17 (×7): 12.5 mg via ORAL
  Filled 2017-12-09: qty 1
  Filled 2017-12-09 (×5): qty 0.5
  Filled 2017-12-09: qty 1

## 2017-12-09 MED ORDER — ZOLPIDEM TARTRATE 5 MG PO TABS
5.0000 mg | ORAL_TABLET | Freq: Every evening | ORAL | Status: DC | PRN
  Administered 2017-12-12 – 2017-12-16 (×2): 5 mg via ORAL
  Filled 2017-12-09 (×2): qty 1

## 2017-12-09 MED ORDER — OXYCODONE HCL 5 MG PO TABS
5.0000 mg | ORAL_TABLET | ORAL | Status: DC | PRN
  Administered 2017-12-10 – 2017-12-15 (×4): 5 mg via ORAL
  Filled 2017-12-09 (×5): qty 1

## 2017-12-09 MED ORDER — DOCUSATE SODIUM 100 MG PO CAPS
100.0000 mg | ORAL_CAPSULE | Freq: Two times a day (BID) | ORAL | Status: DC
  Administered 2017-12-09 – 2017-12-17 (×10): 100 mg via ORAL
  Filled 2017-12-09 (×12): qty 1

## 2017-12-09 MED ORDER — KCL IN DEXTROSE-NACL 20-5-0.45 MEQ/L-%-% IV SOLN
INTRAVENOUS | Status: DC
  Administered 2017-12-09 – 2017-12-13 (×7): via INTRAVENOUS
  Administered 2017-12-13: 1000 mL via INTRAVENOUS
  Administered 2017-12-14 – 2017-12-17 (×6): via INTRAVENOUS
  Filled 2017-12-09 (×16): qty 1000

## 2017-12-09 NOTE — H&P (Signed)
Urology H+P Note   Admitting Attending: Franchot Gallo, MD  Chief Complaint: Clot retention  HPI: Darius [name] is a 62 y.o. male with a history of HTN and recent cardiac catheterization with stent placement on 11/30/17 for a STEMI. He was subsequently admitted for gross hematuria and was voiding at that time and refused foley catheter. He subsequently presented to the ED in urinary retention and foley catheter was placed.   He presented to the urology clinic today for trial of void and was noted to have significant clots in the catheter. Cystoscopy was performed with upsizing of the foley catheter to 24 Fr in the office. However, 3-way catheter was not available. Given the amount of clots and hematuria, he was admitted for continuous bladder irrigation.    Past Medical History: Past Medical History:  Diagnosis Date  . Hypertension     Past Surgical History:  Past Surgical History:  Procedure Laterality Date  . CORONARY/GRAFT ACUTE MI REVASCULARIZATION N/A 11/30/2017   Procedure: Coronary/Graft Acute MI Revascularization;  Surgeon: Belva Crome, MD;  Location: [facility];  Service: Cardiovascular;  Laterality: N/A;  . LEFT HEART CATH AND CORONARY ANGIOGRAPHY N/A 11/30/2017   Procedure: LEFT HEART CATH AND CORONARY ANGIOGRAPHY;  Surgeon: Belva Crome, MD;  Location: [facility];  Service: Cardiovascular;  Laterality: N/A;    Medication: No current facility-administered medications for this encounter.     Allergies: No Known Allergies  Social History: Social History   Tobacco Use  . Smoking status: Current Every Day Smoker  . Smokeless tobacco: Never Used  Substance Use Topics  . Alcohol use: No  . Drug use: No    Family History History reviewed. No pertinent family history.  Review of Systems 10 systems were reviewed and are negative except as noted specifically in the HPI.  Objective   Vital signs in last 24 hours: BP 128/65 (BP Location:  Left Arm)   Pulse 71   Temp 98.4 F (36.9 C) (Oral)   Resp 20   Ht 5\' 9"  (1.753 m)   Wt 106.5 kg   SpO2 99%   BMI 34.66 kg/m   Physical Exam General: NAD, A&O, resting, appropriate HEENT: [place]/AT, EOMI, MMM Pulmonary: Normal work of breathing Cardiovascular: HDS, adequate peripheral perfusion Abdomen: Soft, NTTP, nondistended.  GU: 24 Fr foley catheter in place with thick, red urine with clot draining  Extremities: warm and well perfused Neuro: Appropriate, no focal neurological deficits  Most Recent Labs: Lab Results  Component Value Date   WBC 9.3 12/05/2017   HGB 13.3 12/05/2017   HCT 39.9 12/05/2017   PLT 246 12/05/2017    Lab Results  Component Value Date   NA 139 12/05/2017   K 3.8 12/05/2017   CL 106 12/05/2017   CO2 25 12/05/2017   BUN 12 12/05/2017   CREATININE 0.79 12/05/2017   CALCIUM 9.1 12/05/2017    Lab Results  Component Value Date   INR 1.05 11/30/2017   APTT 29 11/30/2017     IMAGING: No results found.  ------  Assessment:  62 y.o. male with gross hematuria and possible bladder tumor on DAPT who presented with significant clot burden and likely ongoing bleeding requiring continuous bladder irrigation.   Plan: - Admit to urology. - Foley catheter exchanged for a hematuria catheter and continuous bladder irrigation started. - Continue CBI overnight. Titrate to light pink.  - Multimodal pain regimen, B&O suppositories for bladder spasms. - Labs at admission and in  AM.  - Continue finasteride. - Continue DAPT therapy and home cardiac medications.  - Regular diet, mIVF at 75 mL/hr     Procedure: Foley Catheter Placement Note  Indications: 62 y.o. male with a history of STEMI s/p recent cardiac stent placement and gross hematuria with bladder tumor on CTU who presented to the urology clinic with gross hematuria and clots in foley catheter.   Pre-operative Diagnosis: Gross hematuria  Post-operative Diagnosis: Same  Surgeon: Dorothey Baseman, MD  Assistants: None  Procedure Details  Previously placed foley catheter was removed. Patient was placed in the supine position, prepped with Betadine and draped in the usual sterile fashion. We then inserted a 24 [place] 3-way hematuria catheter per urethra which easily passed into the bladder without any resistance at the prostatic urethra.  We then proceeded to insert 10 mL of sterile water into the Foley balloon. Over 1.5 L of normal saline was used to hand irrigate the catheter and significant clot was irrigated out. CBI was started. The catheter was attached to a drainage bag and secured with a leg strap.                Complications: None; patient tolerated the procedure well.  Plan:   - Continue CBI overnight and will re-evaluate in the AM.        Attending Attestation: Dr. Alinda Money was present during the entire procedure.

## 2017-12-10 DIAGNOSIS — R310 Gross hematuria: Secondary | ICD-10-CM

## 2017-12-10 DIAGNOSIS — I25118 Atherosclerotic heart disease of native coronary artery with other forms of angina pectoris: Secondary | ICD-10-CM

## 2017-12-10 DIAGNOSIS — D494 Neoplasm of unspecified behavior of bladder: Secondary | ICD-10-CM

## 2017-12-10 DIAGNOSIS — I237 Postinfarction angina: Secondary | ICD-10-CM

## 2017-12-10 LAB — BASIC METABOLIC PANEL
Anion gap: 6 (ref 5–15)
BUN: 10 mg/dL (ref 6–20)
CALCIUM: 8.3 mg/dL — AB (ref 8.9–10.3)
CO2: 24 mmol/L (ref 22–32)
CREATININE: 0.8 mg/dL (ref 0.61–1.24)
Chloride: 110 mmol/L (ref 98–111)
GFR calc Af Amer: >60 mL/min (ref >60)
GFR calc non Af Amer: >60 mL/min (ref >60)
GLUCOSE: 129 mg/dL — AB (ref 70–99)
Potassium: 3.6 mmol/L (ref 3.5–5.1)
Sodium: 140 mmol/L (ref 135–145)

## 2017-12-10 LAB — HEMOGLOBIN AND HEMATOCRIT, BLOOD
HEMATOCRIT: 30 % — AB (ref 39.0–52.0)
Hemoglobin: 9.7 g/dL — ABNORMAL LOW (ref 13.0–17.0)

## 2017-12-10 LAB — CBC
HEMATOCRIT: 24.9 % — AB (ref 39.0–52.0)
Hemoglobin: 7.8 g/dL — ABNORMAL LOW (ref 13.0–17.0)
MCH: 30.4 pg (ref 26.0–34.0)
MCHC: 31.3 g/dL (ref 30.0–36.0)
MCV: 96.9 fL (ref 80.0–100.0)
Platelets: 284 10*3/uL (ref 150–400)
RBC: 2.57 MIL/uL — ABNORMAL LOW (ref 4.22–5.81)
RDW: 13.5 % (ref 11.5–15.5)
WBC: 8 10*3/uL (ref 4.0–10.5)
nRBC: 0 % (ref 0.0–0.2)

## 2017-12-10 LAB — MRSA PCR SCREENING: MRSA BY PCR: NEGATIVE

## 2017-12-10 LAB — PREPARE RBC (CROSSMATCH)

## 2017-12-10 LAB — ABO/RH: ABO/RH(D): B POS

## 2017-12-10 MED ORDER — SODIUM CHLORIDE 0.9% IV SOLUTION
Freq: Once | INTRAVENOUS | Status: AC
  Administered 2017-12-10: 13:00:00 via INTRAVENOUS

## 2017-12-10 NOTE — Consult Note (Signed)
Cardiology Consultation:   Patient ID: Darius [name] MRN: [id number]; DOB: 28-Oct-1955  Admit date: 12/09/2017 Date of Consult: 12/10/2017  Primary Care Provider: Lars Mage, MD Primary Cardiologist: No primary care provider on file. [name] Primary Electrophysiologist:  None    Patient Profile:   Darius [name] is a 62 y.o. male with a hx of recently diagnosed coronary artery disease presented with STEMI a week ago, who is being seen today for the evaluation of hematuria 10 days after PCI (inferior STEMI 11/30/2017,DESx2 in RCA), at the request of Dr. Diona Fanti.Marland Kitchen  History of Present Illness:   Darius [name] presented with acute inferior wall STEMI on November 18 and underwent emergency revascularization with placement of 2 drug-eluting stents to the right coronary artery, followed by treatment with dual antiplatelet therapy (aspirin and clopidogrel).  He has preserved left ventricular systolic function.  He presents with gross hematuria and substantial reduction in hemoglobin now down to 7.8, from 13.3 on November 23.  He required a catheter to avoid urinary obstruction.  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  He has not had any symptoms reminiscent of his angina when he came in with his myocardial infarction.  He does have a very vague heaviness over his left chest.  Past Medical History:  Diagnosis Date  . Hypertension     Past Surgical History:  Procedure Laterality Date  . CORONARY/GRAFT ACUTE MI REVASCULARIZATION N/A 11/30/2017   Procedure: Coronary/Graft Acute MI Revascularization;  Surgeon: Belva Crome, MD;  Location: [facility];  Service: Cardiovascular;  Laterality: N/A;  . LEFT HEART CATH AND CORONARY ANGIOGRAPHY N/A 11/30/2017   Procedure: LEFT HEART CATH AND CORONARY ANGIOGRAPHY;  Surgeon: Belva Crome, MD;  Location: [facility];  Service:  Cardiovascular;  Laterality: N/A;     Home Medications:  Prior to Admission medications   Medication Sig Start Date End Date Taking? Authorizing Provider  atorvastatin (LIPITOR) 80 MG tablet Take 1 tablet (80 mg total) by mouth daily at 6 PM. 12/03/17 03/03/18 Yes Chundi, Vahini, MD  finasteride (PROSCAR) 5 MG tablet Take 1 tablet (5 mg total) by mouth daily. 12/04/17 01/03/18 Yes Chundi, Vahini, MD  isosorbide mononitrate (IMDUR) 30 MG 24 hr tablet Take 1 tablet (30 mg total) by mouth daily. 12/04/17 03/04/18 Yes Chundi, Vahini, MD  losartan (COZAAR) 25 MG tablet Take 0.5 tablets (12.5 mg total) by mouth daily. 12/04/17 03/04/18 Yes Chundi, Vahini, MD  metoprolol succinate (TOPROL-XL) 25 MG 24 hr tablet Take 0.5 tablets (12.5 mg total) by mouth daily. 12/04/17 03/04/18 Yes Chundi, Verne Spurr, MD  ticagrelor (BRILINTA) 90 MG TABS tablet Take 1 tablet (90 mg total) by mouth 2 (two) times daily. 12/03/17 03/03/18 Yes Chundi, Vahini, MD  nitroGLYCERIN (NITROSTAT) 0.4 MG SL tablet Place 1 tablet (0.4 mg total) under the tongue every 5 (five) minutes x 3 doses as needed for up to 10 days for chest pain. 12/03/17 12/13/17  Lars Mage, MD    Inpatient Medications: Scheduled Meds: . sodium chloride   Intravenous Once  . atorvastatin  80 mg Oral q1800  . docusate sodium  100 mg Oral BID  . finasteride  5 mg Oral Daily  . isosorbide mononitrate  30 mg Oral Daily  . losartan  12.5 mg Oral Daily  . metoprolol succinate  12.5 mg Oral Daily  . ticagrelor  90 mg Oral BID   Continuous Infusions: .  dextrose 5 % and 0.45 % NaCl with KCl 20 mEq/L 75 mL/hr at 12/10/17 1127  . sodium chloride irrigation     PRN Meds: acetaminophen, diphenhydrAMINE **OR** diphenhydrAMINE, nitroGLYCERIN, ondansetron, opium-belladonna, oxyCODONE, zolpidem  Allergies:   No Known Allergies  Social History:   Social History   Socioeconomic History  . Marital status: Married    Spouse name: Not on file  . Number of children:  Not on file  . Years of education: Not on file  . Highest education level: Not on file  Occupational History  . Not on file  Social Needs  . Financial resource strain: Not on file  . Food insecurity:    Worry: Not on file    Inability: Not on file  . Transportation needs:    Medical: Not on file    Non-medical: Not on file  Tobacco Use  . Smoking status: Current Every Day Smoker  . Smokeless tobacco: Never Used  Substance and Sexual Activity  . Alcohol use: No  . Drug use: No  . Sexual activity: Not on file  Lifestyle  . Physical activity:    Days per week: Not on file    Minutes per session: Not on file  . Stress: Not on file  Relationships  . Social connections:    Talks on phone: Not on file    Gets together: Not on file    Attends religious service: Not on file    Active member of club or organization: Not on file    Attends meetings of clubs or organizations: Not on file    Relationship status: Not on file  . Intimate partner violence:    Fear of current or ex partner: Not on file    Emotionally abused: Not on file    Physically abused: Not on file    Forced sexual activity: Not on file  Other Topics Concern  . Not on file  Social History Narrative  . Not on file    Family History:   History reviewed. No pertinent family history.   ROS:  Please see the history of present illness.   All other ROS reviewed and negative.     Physical Exam/Data:   Vitals:   12/09/17 1756 12/09/17 2036 12/10/17 0509 12/10/17 1223  BP: 128/65 (!) 116/58 (!) 130/58 (!) 127/49  Pulse: 71 62 (!) 58 62  Resp: 20 18 18 11   Temp: 98.4 F (36.9 C) 98 F (36.7 C) 98.8 F (37.1 C)   TempSrc: Oral Oral Oral   SpO2: 99% 100% 94% 92%  Weight:      Height:        Intake/Output Summary (Last 24 hours) at 12/10/2017 1232 Last data filed at 12/10/2017 1158 Gross per 24 hour  Intake 57854.72 ml  Output 76005 ml  Net -18150.28 ml   Filed Weights   12/09/17 1748  Weight: 106.5  kg   Body mass index is 34.66 kg/m.  General:  Well nourished, well developed, in no acute distress, pale HEENT: normal Lymph: no adenopathy Neck: no JVD Endocrine:  No thryomegaly Vascular: No carotid bruits; FA pulses 2+ bilaterally without bruits  Cardiac:  normal S1, S2; RRR; no murmur  Lungs:  clear to auscultation bilaterally, no wheezing, rhonchi or rales  Abd: soft, nontender, no hepatomegaly  Ext: no edema Musculoskeletal:  No deformities, BUE and BLE strength normal and equal Skin: warm and dry  Neuro:  CNs 2-12 intact, no focal abnormalities noted Psych:  Normal affect  EKG:  The EKG was personally reviewed and demonstrates: #19 2019 ECG shows junctional rhythm, left axis deviation, inferior infarction with ST segment back to isoelectric baseline Telemetry:  Telemetry was personally reviewed and demonstrates: Sinus rhythm  Relevant CV Studies: 11/30/2017 CATH  Inferior ST elevation myocardial infarction due to 100% occlusion of the proximal RCA.  Successful therapy with proximal and distal vessel stenting reducing 100% and 80% stenoses to 10% and 0% respectively with improvement in TIMI flow from 0 to III.  22 x 4.0 mm Onyx stents were used with the proximal stent being postdilated to 4.5 mm in diameter.  Normal left main.  LAD contains segmental 60 to 70% mid vessel narrowing.  Large normal ramus intermedius.  Circumflex gives origin to 2 small obtuse marginal branches with a second marginal containing 60 to 70% diffuse disease in the mid vessel.  Inferobasal akinesis, overall LVEF greater than 60%.  LVEDP 20.  Delayed reperfusion due to tortuous, angulated innominate/aortic arch anatomy.  Procedure complicated by Bezold-Jarisch response with profound hypotension and bradycardia requiring intravenous atropine and IV Levophed for heart rate and blood pressure support.  RECOMMENDATIONS:   IV fluid at a rate of 100 cc/h.  Wean Levophed once blood pressure is  consistently above 100 mmHg.  Continue IV ReoPro for 8 hours.  Aspirin and Plavix dual antiplatelet therapy for 12 months.  High intensity statin therapy.  Institute beta-blocker therapy as tolerated by heart rate and blood pressure.  Consider angiotensin converting enzyme inhibitor or angiotensin receptor blocker for blood pressure control if required.  Patient has been on diuretic therapy as monotherapy for hypertension.  Smoking cessation.  ECHO 11/30/2017 - Left ventricle: The cavity size was normal. Wall thickness was   increased in a pattern of moderate LVH. Systolic function was   normal. The estimated ejection fraction was in the range of 60%   to 65%. Inferobasal hypokinesis. The study is not technically   sufficient to allow evaluation of LV diastolic function. - Mitral valve: Mildly thickened leaflets . There was trivial   regurgitation. - Left atrium: The atrium was normal in size. - Inferior vena cava: The vessel was dilated. The respirophasic   diameter changes were blunted (< 50%), consistent with elevated   central venous pressure.  Impressions:  - LVEF 60-65%, inferobasal hypokinesis, moderate LVH, trivial MR,   normal LA size, dilated IVC.   Laboratory Data:  Chemistry Recent Labs  Lab 12/05/17 0425 12/09/17 2031 12/10/17 0317  NA 139 138 140  K 3.8 3.5 3.6  CL 106 106 110  CO2 25 23 24   GLUCOSE 138* 108* 129*  BUN 12 11 10   CREATININE 0.79 0.81 0.80  CALCIUM 9.1 8.2* 8.3*  GFRNONAA >60 >60 >60  GFRAA >60 >60 >60  ANIONGAP 8 9 6     No results for input(s): PROT, ALBUMIN, AST, ALT, ALKPHOS, BILITOT in the last 168 hours. Hematology Recent Labs  Lab 12/05/17 0425 12/09/17 2031 12/10/17 0317  WBC 9.3 8.1 8.0  RBC 4.37 2.76* 2.57*  HGB 13.3 8.6* 7.8*  HCT 39.9 26.6* 24.9*  MCV 91.3 96.4 96.9  MCH 30.4 31.2 30.4  MCHC 33.3 32.3 31.3  RDW 13.2 13.5 13.5  PLT 246 274 284   Cardiac EnzymesNo results for input(s): TROPONINI in the last  168 hours. No results for input(s): TROPIPOC in the last 168 hours.  BNPNo results for input(s): BNP, PROBNP in the last 168 hours.  DDimer No results for input(s): DDIMER in the last 168  hours.  Radiology/Studies:  No results found.  Assessment and Plan:   1. CAD s/p recent STEMI and DES x 2: Unfortunately, so soon after his presentation with acute myocardial infarction and revascularization with 2 drug-eluting stents he is at extremely high risk of stent thrombosis and acute myocardial infarction if his dual antiplatelet therapy is interrupted.  With the types of stents that he received, antiplatelet agents should not be stopped for 3 months minimum.  This is a very difficult situation since he has had significant hematuria with a marked reduction in hemoglobin and will require transfusion.  For the time being we are obligated to hold his antiplatelet agents.  Even if the hematuria stops, it is very likely it will restart when we initiate antiplatelet agents.  Because of its lower bleeding risk, would prefer clopidogrel over Brilinta.  Because of the different mechanism of action, would have to load clopidogrel 300 mg daily.  Will wait until tomorrow to do this, depending on the evolution of his bleeding and hemoglobin levels.  Discussed with Dr. Irish Lack, conventional cardiology on call.  He has some slight discomfort in his left chest which might be demand angina in the setting of severe anemia.  Check an ECG.  It is quite possible he has some ischemia in the territory of the small oblique marginal branches which had high-grade stenoses, but which were not amenable to revascularization. 2. Bladder tumor      For questions or updates, please contact [place] Please consult [email or web address] for contact info under     Signed, Sanda Klein, MD  12/10/2017 12:32 PM

## 2017-12-10 NOTE — Progress Notes (Signed)
  Subjective: Patient required significant irrigation last pm despite open CBI.   Objective: Vital signs in last 24 hours: Temp:  [98 F (36.7 C)-98.8 F (37.1 C)] 98.8 F (37.1 C) (11/28 0509) Pulse Rate:  [58-71] 58 (11/28 0509) Resp:  [18-20] 18 (11/28 0509) BP: (116-130)/(58-65) 130/58 (11/28 0509) SpO2:  [94 %-100 %] 94 % (11/28 0509) Weight:  [106.5 kg] 106.5 kg (11/27 1748)  Intake/Output from previous day: 11/27 0701 - 11/28 0700 In: 42854.7 [I.V.:674.7] Out: 17408 [Urine:51380] Intake/Output this shift: Total I/O In: -  Out: 5400 [Urine:5400]  Physical Exam:  Constitutional: Vital signs reviewed. WD WN in NAD   Eyes: PERRL, No scleral icterus.   Cardiovascular: RRR Pulmonary/Chest: Normal effort Extremities: No cyanosis or edema   CBI effluent clear pink  Lab Results: Recent Labs    12/09/17 2031 12/10/17 0317  HGB 8.6* 7.8*  HCT 26.6* 24.9*   BMET Recent Labs    12/09/17 2031 12/10/17 0317  NA 138 140  K 3.5 3.6  CL 106 110  CO2 23 24  GLUCOSE 108* 129*  BUN 11 10  CREATININE 0.81 0.80  CALCIUM 8.2* 8.3*   No results for input(s): LABPT, INR in the last 72 hours. No results for input(s): LABURIN in the last 72 hours. Results for orders placed or performed during the hospital encounter of 11/30/17  MRSA PCR Screening     Status: None   Collection Time: 11/30/17  3:11 AM  Result Value Ref Range Status   MRSA by PCR NEGATIVE NEGATIVE Final    Comment:        The GeneXpert MRSA Assay (FDA approved for NASAL specimens only), is one component of a comprehensive MRSA colonization surveillance program. It is not intended to diagnose MRSA infection nor to guide or monitor treatment for MRSA infections. Performed at [facility], [place] [address] [place], [place] [address]     Studies/Results: No results found.  Assessment/Plan:   Difficult situation with the patient more than likely having a fairly large right sided bladder  tumor, significant hematuria with subsequent hemorrhagic anemia and recent STEMI on 11/18 with stenting of RCA, now on Brilinta.  The patient is hemodynamically stable.  However, he is requiring significant bladder irrigation with a large bore catheter and intermittent clot retention.    I have spoken with the patient and his wife for approximately 30 minutes regarding his situation as well as plan.  First, I will transfer to stepdown.  He will require blood--I'll order 2 u PRBC--I discussed this with the patient, as well as implications of needing this blood.  He consents  I will speak with the cardiologist on duty today for consultation.   LOS: 1 day   Lillette Boxer Yashvi Jasinski 12/10/2017, 8:21 AM

## 2017-12-10 NOTE — Progress Notes (Signed)
Report called to Benjamine Mola, RN in Step-Down.  Patient to transfer to room 1223.  Blood consent signed and on chart for transfusion today.

## 2017-12-10 NOTE — Progress Notes (Signed)
Pt required fast rate of CBI throughout the shift. Pt had to be hand irrigated twice with moderate amount of blood clots irrigated. VSS. Wife at bedside during the night.

## 2017-12-10 NOTE — Progress Notes (Signed)
RN spoke with Dr. Diona Fanti.  Dr. Diona Fanti gave order for Regular Diet and patient may take his medications.  He stated Dr. Debara Pickett stated patient should continue taking his Brilinta.

## 2017-12-11 ENCOUNTER — Encounter (HOSPITAL_COMMUNITY): Payer: Self-pay | Admitting: Physician Assistant

## 2017-12-11 LAB — TYPE AND SCREEN
ABO/RH(D): B POS
ANTIBODY SCREEN: NEGATIVE
Unit division: 0
Unit division: 0

## 2017-12-11 LAB — BPAM RBC
BLOOD PRODUCT EXPIRATION DATE: 201912102359
Blood Product Expiration Date: 201912102359
ISSUE DATE / TIME: 201911281517
ISSUE DATE / TIME: 201911281228
Unit Type and Rh: 7300
Unit Type and Rh: 7300

## 2017-12-11 LAB — HEMOGLOBIN AND HEMATOCRIT, BLOOD
HCT: 30.6 % — ABNORMAL LOW (ref 39.0–52.0)
Hemoglobin: 9.8 g/dL — ABNORMAL LOW (ref 13.0–17.0)

## 2017-12-11 MED ORDER — CLOPIDOGREL BISULFATE 75 MG PO TABS
75.0000 mg | ORAL_TABLET | Freq: Every day | ORAL | Status: DC
  Administered 2017-12-12 – 2017-12-17 (×5): 75 mg via ORAL
  Filled 2017-12-11 (×5): qty 1

## 2017-12-11 MED ORDER — CLOPIDOGREL BISULFATE 75 MG PO TABS
300.0000 mg | ORAL_TABLET | Freq: Once | ORAL | Status: AC
  Administered 2017-12-11: 300 mg via ORAL
  Filled 2017-12-11: qty 4

## 2017-12-11 NOTE — Progress Notes (Signed)
  Subjective: Patient reports no bladder pain last pm. No significant clots irrigated overnight. 2 U prbcs transfused  Objective: Vital signs in last 24 hours: Temp:  [98.1 F (36.7 C)-100.1 F (37.8 C)] 99.2 F (37.3 C) (11/29 0400) Pulse Rate:  [52-70] 61 (11/29 0700) Resp:  [6-25] 11 (11/29 0700) BP: (95-140)/(38-70) 124/48 (11/29 0700) SpO2:  [91 %-98 %] 97 % (11/29 0700)  Intake/Output from previous day: 11/28 0701 - 11/29 0700 In: 35448.5 [P.O.:240; I.V.:1851.9; Blood:356.7] Out: 17793 [Urine:99425] Intake/Output this shift: Total I/O In: -  Out: 2100 [Urine:2100]  Physical Exam:  Constitutional: Vital signs reviewed. WD WN in NAD   Eyes: PERRL, No scleral icterus.   Cardiovascular: RRR Pulmonary/Chest: Normal effort   Irrigant effluent clear pink w/o clots  Lab Results: Recent Labs    12/09/17 2031 12/10/17 0317 12/10/17 1937  HGB 8.6* 7.8* 9.7*  HCT 26.6* 24.9* 30.0*   BMET Recent Labs    12/09/17 2031 12/10/17 0317  NA 138 140  K 3.5 3.6  CL 106 110  CO2 23 24  GLUCOSE 108* 129*  BUN 11 10  CREATININE 0.81 0.80  CALCIUM 8.2* 8.3*   No results for input(s): LABPT, INR in the last 72 hours. No results for input(s): LABURIN in the last 72 hours. Results for orders placed or performed during the hospital encounter of 12/09/17  MRSA PCR Screening     Status: None   Collection Time: 12/10/17  3:03 PM  Result Value Ref Range Status   MRSA by PCR NEGATIVE NEGATIVE Final    Comment:        The GeneXpert MRSA Assay (FDA approved for NASAL specimens only), is one component of a comprehensive MRSA colonization surveillance program. It is not intended to diagnose MRSA infection nor to guide or monitor treatment for MRSA infections. Performed at [facility], [place] [address], [place], [place] [address]     Studies/Results: No results found.  Assessment/Plan:   Significant hematuria with antiplatelet therapy following  MI/stenting, with 4 cm bladder mass.  Hematuria has decreased significantly over the past 24 hours.    I appreciate cardiology consultation.  I would suggest that we get him on the Plavix, observed.  If significant decrease overall in bleeding, we will take the catheter out, give a voiding trial and perhaps follow-up as an outpatient.  If it does not significantly taper off, consider anesthetic procedure to perform resection of his bladder tumor midweek.    I have discussed this in detail with the patient and his wife (she does not seem to understand this well).    I will check his hemoglobin this morning.   25 mins spent face to face with pt   LOS: 2 days   Jorja Loa 12/11/2017, 7:50 AM

## 2017-12-11 NOTE — Progress Notes (Addendum)
Progress Note  Patient Name: Darius [name] Date of Encounter: 12/11/2017  Primary Cardiologist: No primary care provider on file. I'm actually not clear on this. The consult note says [name] but I do not see that [name] has ever seen the patient. Dr. Tamala Julian did STEMI cath and Dr. Ellyn Hack followed during admission.  Subjective   Feeling better overall. Instead of being dark red urine has been pink this AM. No further CP.  Inpatient Medications    Scheduled Meds: . atorvastatin  80 mg Oral q1800  . docusate sodium  100 mg Oral BID  . finasteride  5 mg Oral Daily  . isosorbide mononitrate  30 mg Oral Daily  . losartan  12.5 mg Oral Daily  . metoprolol succinate  12.5 mg Oral Daily   Continuous Infusions: . dextrose 5 % and 0.45 % NaCl with KCl 20 mEq/L 75 mL/hr at 12/11/17 0025  . sodium chloride irrigation     PRN Meds: acetaminophen, diphenhydrAMINE **OR** diphenhydrAMINE, nitroGLYCERIN, ondansetron, opium-belladonna, oxyCODONE, zolpidem   Vital Signs    Vitals:   12/11/17 0400 12/11/17 0500 12/11/17 0600 12/11/17 0700  BP: (!) 121/48 (!) 115/49 (!) 113/58 (!) 124/48  Pulse: (!) 55 (!) 52 (!) 55 61  Resp: 10 12 (!) 6 11  Temp: 99.2 F (37.3 C)     TempSrc: Oral     SpO2: 95% 96% 94% 97%  Weight:      Height:        Intake/Output Summary (Last 24 hours) at 12/11/2017 0813 Last data filed at 12/11/2017 0735 Gross per 24 hour  Intake 32448.54 ml  Output 96125 ml  Net -63676.46 ml   Filed Weights   12/09/17 1748  Weight: 106.5 kg    Telemetry    NSR - Personally Reviewed  Physical Exam   GEN: No acute distress.  HEENT: Normocephalic, atraumatic, sclera non-icteric. Neck: No JVD or bruits. Cardiac: RRR no murmurs, rubs, or gallops.  Radials/DP/PT 1+ and equal bilaterally.  Respiratory: Clear to auscultation bilaterally. Breathing is unlabored. GI: Soft, nontender, non-distended, BS +x 4. MS: no deformity. Extremities: No clubbing or  cyanosis. No edema. Distal pedal pulses are 2+ and equal bilaterally. Neuro:  AAOx3. Follows commands. Psych:  Responds to questions appropriately with a normal affect.  Labs    Chemistry Recent Labs  Lab 12/05/17 0425 12/09/17 2031 12/10/17 0317  NA 139 138 140  K 3.8 3.5 3.6  CL 106 106 110  CO2 25 23 24   GLUCOSE 138* 108* 129*  BUN 12 11 10   CREATININE 0.79 0.81 0.80  CALCIUM 9.1 8.2* 8.3*  GFRNONAA >60 >60 >60  GFRAA >60 >60 >60  ANIONGAP 8 9 6      Hematology Recent Labs  Lab 12/05/17 0425 12/09/17 2031 12/10/17 0317 12/10/17 1937  WBC 9.3 8.1 8.0  --   RBC 4.37 2.76* 2.57*  --   HGB 13.3 8.6* 7.8* 9.7*  HCT 39.9 26.6* 24.9* 30.0*  MCV 91.3 96.4 96.9  --   MCH 30.4 31.2 30.4  --   MCHC 33.3 32.3 31.3  --   RDW 13.2 13.5 13.5  --   PLT 246 274 284  --    Radiology    No results found.  Patient Profile     62 y.o. male with recently diagnosed CAD with inferior STEMI 11/30/17 s/p DES x 2 to RCA, normal LVEF, HTN, HLD. Cath showed residual 60-70% mLAD, 60-70% OM2. At time of STEMI dx was noted to  have gross hematuria. CT abdomen showed bladder mass and urology planned OP management. However, he was readmitted the evening of 11/27 with gross hematuria and ABL anemia with Hgb 13.3->7.8 an required catheter to avoid urinary obstruction. 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 Cardiology asked to follow in light of need for ongoing DAPT, and vague chest pressure possibly representing demand process from his anemia.  Assessment & Plan    1. Hematuria and bladder mass - per urology. Note this morning indicates that hematuria significantly decreased over past 24 hours. They agree with aforementioned plan of Plavix and observation to decide next steps of inpatient vs outpatient management. I would think that inpatient approach would be the best strategy given patient's degree of recent  significant MI and delicate nature of demand process from ongoing anemia.  2. CAD -  with the types of stents that he received, antiplatelet agents should not be stopped for 3 months minimum. This is a very difficult situation since he has had significant hematuria with a marked reduction in hemoglobin and will require transfusion. F/u Hgb last night was 9.7. Has not had labs drawn this AM, will order stat. Last dose of Brilinta was 11/27 PM - was not sent home on ASA given the hematuria. Dr. [name] recommended to await hemoglobin levels to restart antiplatelet agent likely in form of a loading dose of Plavix. Will await Hgb level and discussion with MD.   3. Chest discomfort - EKG mentioned in yesterday's note. I ordered for today, demonstrated NSR with probable evolution of prior inferior MI. Likely demand related. Resolved s/p transfusion.  4. HTN - controlled.  For questions or updates, please contact [place] Please consult [email or web address] for contact info under Cardiology/STEMI.  Signed, [name], PA-C 12/11/2017, 8:13 AM    Attending Note:   The patient was seen and examined.  Agree with assessment and plan as noted above.  Changes made to the above note as needed.  Patient seen and independently examined with [name], PA .   We discussed all aspects of the encounter. I agree with the assessment and plan as stated above.  1.   CAD :   S/p stenting of the prox RCA  On Nov. 18  Developed hematuria shortly thereafter Now has significnat hematura  Agree with plans to try Plavix instead of Brininta. Will see if this help reduce his hematura and keeps him stable from a cardiac stantpont  If needed, we can consider transitioning him to [employer] ( a short acting IV anti-platelet) while in the hospital to allow the Brilinta to wash out I've discussed with Dr. Diona Fanti and he realizes that he may need to do the procedure while the patient is on Plavix - so bridging him with  Scarlette Slice may not be needed    I have spent a total of 40 minutes with patient reviewing hospital  notes , telemetry, EKGs, labs and examining patient as well as establishing an assessment and plan that was discussed with the patient. > 50% of time was spent in direct patient care.    Thayer Headings, Brooke Bonito., MD, [facility] 12/11/2017, 11:33 AM [address]  [place] Pager [phone]

## 2017-12-12 LAB — CBC
HCT: 28.5 % — ABNORMAL LOW (ref 39.0–52.0)
Hemoglobin: 9.2 g/dL — ABNORMAL LOW (ref 13.0–17.0)
MCH: 30.4 pg (ref 26.0–34.0)
MCHC: 32.3 g/dL (ref 30.0–36.0)
MCV: 94.1 fL (ref 80.0–100.0)
Platelets: 312 10*3/uL (ref 150–400)
RBC: 3.03 MIL/uL — ABNORMAL LOW (ref 4.22–5.81)
RDW: 14.6 % (ref 11.5–15.5)
WBC: 9.4 10*3/uL (ref 4.0–10.5)
nRBC: 0 % (ref 0.0–0.2)

## 2017-12-12 MED ORDER — HYOSCYAMINE SULFATE 0.125 MG SL SUBL
0.1250 mg | SUBLINGUAL_TABLET | SUBLINGUAL | Status: DC | PRN
  Administered 2017-12-12 – 2017-12-17 (×17): 0.125 mg via SUBLINGUAL
  Filled 2017-12-12 (×19): qty 1

## 2017-12-12 NOTE — Progress Notes (Signed)
Pt has had a few 2 sec pauses during the night. At this time pt has a pause of 3.43. Cardiology on call notified with no orders received at this time. Apart from bradycardia, VSS, Will continue to monitor.

## 2017-12-12 NOTE — Progress Notes (Signed)
For the past 2-2.5 hrs this RN has attempted to slow the CBI rate. CBI output volumes had remained consistently a pink lemonade shade and no clots noted to be in the tubing or bag. At this time pt complains of bladder spasms. When entering the room, it was immediately noted that output was darker in color and chamber of the CBI tubing indicates that saline is not inflowing well.  Foley was hand irrigated and several small clots pulled back. CBI rate increased and is now following well. Pt verbalizes understanding to call RN if bladder spasms return.

## 2017-12-12 NOTE — Progress Notes (Signed)
Progress Note  Patient Name: Darius [name] Date of Encounter: 12/12/2017  Primary Cardiologist: No primary care provider on file.   Subjective   No chest pain, no shortness of breath, bradycardia asymptomatic while sleeping (upper 30's). Occasional pause  Inpatient Medications    Scheduled Meds: . atorvastatin  80 mg Oral q1800  . clopidogrel  75 mg Oral Daily  . docusate sodium  100 mg Oral BID  . finasteride  5 mg Oral Daily  . isosorbide mononitrate  30 mg Oral Daily  . losartan  12.5 mg Oral Daily  . metoprolol succinate  12.5 mg Oral Daily   Continuous Infusions: . dextrose 5 % and 0.45 % NaCl with KCl 20 mEq/L 75 mL/hr at 12/12/17 0019  . sodium chloride irrigation     PRN Meds: acetaminophen, diphenhydrAMINE **OR** diphenhydrAMINE, hyoscyamine, nitroGLYCERIN, ondansetron, opium-belladonna, oxyCODONE, zolpidem   Vital Signs    Vitals:   12/12/17 0400 12/12/17 0407 12/12/17 0639 12/12/17 0800  BP: (!) 141/87  (!) 129/53 117/86  Pulse: 62  (!) 43 (!) 41  Resp: 16  20 12   Temp:  99.6 F (37.6 C)    TempSrc:  Axillary    SpO2: 97%  92% 96%  Weight:      Height:        Intake/Output Summary (Last 24 hours) at 12/12/2017 0859 Last data filed at 12/12/2017 0854 Gross per 24 hour  Intake 49653.34 ml  Output 83675 ml  Net -34021.66 ml   Filed Weights   12/09/17 1748  Weight: 106.5 kg    Telemetry    Normal sinus rhythm/ brady, pauses as below- Personally Reviewed  ECG    Sinus bradycardia with T wave inversion noted in the inferior leads- Personally Reviewed  Physical Exam   GEN: No acute distress.   Neck: No JVD Cardiac: RRR, no murmurs, rubs, or gallops.  Respiratory: Clear to auscultation bilaterally. GI: Soft, nontender, non-distended, Foley catheter in place, pink urine MS: No edema; No deformity. Neuro:  Nonfocal  Psych: Normal affect   Labs    Chemistry Recent Labs  Lab 12/09/17 2031 12/10/17 0317  NA 138 140  K 3.5 3.6  CL  106 110  CO2 23 24  GLUCOSE 108* 129*  BUN 11 10  CREATININE 0.81 0.80  CALCIUM 8.2* 8.3*  GFRNONAA >60 >60  GFRAA >60 >60  ANIONGAP 9 6     Hematology Recent Labs  Lab 12/09/17 2031 12/10/17 0317 12/10/17 1937 12/11/17 0829 12/12/17 0308  WBC 8.1 8.0  --   --  9.4  RBC 2.76* 2.57*  --   --  3.03*  HGB 8.6* 7.8* 9.7* 9.8* 9.2*  HCT 26.6* 24.9* 30.0* 30.6* 28.5*  MCV 96.4 96.9  --   --  94.1  MCH 31.2 30.4  --   --  30.4  MCHC 32.3 31.3  --   --  32.3  RDW 13.5 13.5  --   --  14.6  PLT 274 284  --   --  312    Cardiac EnzymesNo results for input(s): TROPONINI in the last 168 hours. No results for input(s): TROPIPOC in the last 168 hours.   BNPNo results for input(s): BNP, PROBNP in the last 168 hours.   DDimer No results for input(s): DDIMER in the last 168 hours.   Radiology    No results found.  Cardiac Studies   Cardiac catheterization 11/30/2017-stent to the proximal RCA.  Ejection fraction on echocardiogram, 65%  Patient  Profile     62 y.o. male with recently diagnosed CAD with inferior STEMI 11/30/17 s/p DES x 2 to RCA, normal LVEF, HTN, HLD. Cath showed residual 60-70% mLAD, 60-70% OM2. At time of STEMI dx was noted to have gross hematuria. CT abdomen showed bladder mass and urology planned OP management. However, he was readmitted the evening of 11/27 with gross hematuria and ABL anemia with Hgb 13.3->7.8 an required catheter to avoid urinary obstruction. 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 Cardiology asked to follow in light of need for ongoing DAPT, and vague chest pressure possibly representing demand process from his anemia.  Assessment & Plan    Coronary artery disease with gross hematuria secondary to bladder mass, possible transitional cell carcinoma - Had stenting of the proximal RCA on November 30, 2017 just a few days ago.  He developed his hematuria shortly  thereafter. - Currently trying Plavix instead of Brilinta which may have a decreased bleeding profile. - Dr. Acie Fredrickson has talked to Dr. Diona Fanti and realizes the patient will need antiplatelet therapy surrounding any urologic procedure given the recency of stent placement to try to avoid early stent thrombosis.  One possibility is to transition him to Cangrelor which is a short acting IV antiplatelet agent while in the hospital setting.   -No further chest pain.  Anemia - Hematuria.  Stable.  Monitoring.  Gross hematuria - Urology notes reviewed.  Bladder spasms noted.  Clots.  Bladder obstruction.  Pause/bradycardia -2 seconds noted as well as 3.4 seconds.  Continue to monitor.  This occurred during the night, a symptomatic.  - I will stop Toprol 12.5mg .       For questions or updates, please contact [place] Please consult [email or web address] for contact info under        Signed, Candee Furbish, MD  12/12/2017, 8:59 AM

## 2017-12-12 NOTE — Progress Notes (Signed)
Pt CBI has remained "wide open" with saline irrigation for the majority of the shift. This RN has had to hand irrigate foley for 7-8 times. Rarely are clots noted in tubing however pt experiences bladder spasms nearly ever 2-3 hrs. Each time, hand irrigation has drawn back clots.

## 2017-12-12 NOTE — Progress Notes (Signed)
Subjective: CC: Hematuria.  Hx:  The patient had another rough night with bladder spasms associated with clots and catheter obstruction.  His urine has actually cleared this morning and he has had no spasms for a few hours.    ROS:  Review of Systems  Constitutional: Positive for malaise/fatigue. Negative for chills and fever.  Gastrointestinal: Negative for abdominal pain.    Anti-infectives: Anti-infectives (From admission, onward)   None      Current Facility-Administered Medications  Medication Dose Route Frequency Provider Last Rate Last Dose  . acetaminophen (TYLENOL) tablet 650 mg  650 mg Oral Q4H PRN Raynelle Bring, MD      . atorvastatin (LIPITOR) tablet 80 mg  80 mg Oral q1800 Raynelle Bring, MD   80 mg at 12/11/17 1858  . clopidogrel (PLAVIX) tablet 75 mg  75 mg Oral Daily Dunn, Dayna N, PA-C      . dextrose 5 % and 0.45 % NaCl with KCl 20 mEq/L infusion   Intravenous Continuous Raynelle Bring, MD 75 mL/hr at 12/12/17 0019    . diphenhydrAMINE (BENADRYL) injection 12.5 mg  12.5 mg Intravenous Q6H PRN Raynelle Bring, MD       Or  . diphenhydrAMINE (BENADRYL) 12.5 MG/5ML elixir 12.5 mg  12.5 mg Oral Q6H PRN Raynelle Bring, MD      . docusate sodium (COLACE) capsule 100 mg  100 mg Oral BID Raynelle Bring, MD   100 mg at 12/11/17 1044  . finasteride (PROSCAR) tablet 5 mg  5 mg Oral Daily Raynelle Bring, MD   5 mg at 12/11/17 1044  . hyoscyamine (LEVSIN SL) SL tablet 0.125 mg  0.125 mg Sublingual Q4H PRN Irine Seal, MD      . isosorbide mononitrate (IMDUR) 24 hr tablet 30 mg  30 mg Oral Daily Raynelle Bring, MD   30 mg at 12/11/17 1044  . losartan (COZAAR) tablet 12.5 mg  12.5 mg Oral Daily Raynelle Bring, MD   12.5 mg at 12/11/17 1044  . metoprolol succinate (TOPROL-XL) 24 hr tablet 12.5 mg  12.5 mg Oral Daily Raynelle Bring, MD   12.5 mg at 12/10/17 1245  . nitroGLYCERIN (NITROSTAT) SL tablet 0.4 mg  0.4 mg Sublingual Q5 Min x 3 PRN Raynelle Bring, MD      . ondansetron  [facility]) injection 4 mg  4 mg Intravenous Q4H PRN Raynelle Bring, MD      . opium-belladonna (B&O SUPPRETTES) 16.2-60 MG suppository 1 suppository  1 suppository Rectal Q6H PRN Raynelle Bring, MD   1 suppository at 12/12/17 0509  . oxyCODONE (Oxy IR/ROXICODONE) immediate release tablet 5 mg  5 mg Oral Q4H PRN Raynelle Bring, MD   5 mg at 12/11/17 1105  . sodium chloride irrigation 0.9 % 3,000 mL  3,000 mL Irrigation Continuous Franchot Gallo, MD   3,000 mL at 12/11/17 1858  . zolpidem (AMBIEN) tablet 5 mg  5 mg Oral QHS PRN,MR X 1 Raynelle Bring, MD   5 mg at 12/12/17 0025     Objective: Vital signs in last 24 hours: Temp:  [97.8 F (36.6 C)-99.6 F (37.6 C)] 99.6 F (37.6 C) (11/30 0407) Pulse Rate:  [43-67] 43 (11/30 0639) Resp:  [10-21] 20 (11/30 0639) BP: (122-141)/(49-87) 129/53 (11/30 0639) SpO2:  [92 %-97 %] 92 % (11/30 0639)  Intake/Output from previous day: 11/29 0701 - 11/30 0700 In: 42608.3 [I.V.:1358.3] Out: 92426 [Urine:83925] Intake/Output this shift: Total I/O In: 1200 [Other:1200] Out: 1000 [Urine:1000]   Physical Exam  Constitutional: He appears  well-developed and well-nourished.  Cardiovascular:  Bradycardia  Pulmonary/Chest: Effort normal. No respiratory distress.  Abdominal: Soft. He exhibits no mass. There is no tenderness.  Vitals reviewed.   Lab Results:  Recent Labs    12/10/17 0317  12/11/17 0829 12/12/17 0308  WBC 8.0  --   --  9.4  HGB 7.8*   < > 9.8* 9.2*  HCT 24.9*   < > 30.6* 28.5*  PLT 284  --   --  312   < > = values in this interval not displayed.   BMET Recent Labs    12/09/17 2031 12/10/17 0317  NA 138 140  K 3.5 3.6  CL 106 110  CO2 23 24  GLUCOSE 108* 129*  BUN 11 10  CREATININE 0.81 0.80  CALCIUM 8.2* 8.3*   PT/INR No results for input(s): LABPROT, INR in the last 72 hours. ABG No results for input(s): PHART, HCO3 in the last 72 hours.  Invalid input(s): PCO2, PO2  Studies/Results: No results  found.   Assessment and Plan: Gross hematuria from bladder mass and antiplatelet therapy has abated with the changed to Plavix.   Dr Diona Fanti will consider TURBT next week.  CBI will be maintained on a slow drip for now.   Acute blood loss anemia.   Hgb 9.2 which is down from 9.8 on 11/29.  Continue to monitor.   Bladder spasms.  Hopefully they will be less of an issue with the decline in bleeding, but I will order levsin SL to supplement the B&O suppositories.       LOS: 3 days    Irine Seal 12/12/2017 [phone] ID: Darius [name], male   DOB: September 05, 1955, 62 y.o.   MRN: [id number]

## 2017-12-13 DIAGNOSIS — I2510 Atherosclerotic heart disease of native coronary artery without angina pectoris: Secondary | ICD-10-CM

## 2017-12-13 DIAGNOSIS — N3289 Other specified disorders of bladder: Secondary | ICD-10-CM

## 2017-12-13 DIAGNOSIS — N020 Recurrent and persistent hematuria with minor glomerular abnormality: Secondary | ICD-10-CM

## 2017-12-13 LAB — CBC
HCT: 30.3 % — ABNORMAL LOW (ref 39.0–52.0)
Hemoglobin: 9.5 g/dL — ABNORMAL LOW (ref 13.0–17.0)
MCH: 30 pg (ref 26.0–34.0)
MCHC: 31.4 g/dL (ref 30.0–36.0)
MCV: 95.6 fL (ref 80.0–100.0)
Platelets: 344 10*3/uL (ref 150–400)
RBC: 3.17 MIL/uL — ABNORMAL LOW (ref 4.22–5.81)
RDW: 14.3 % (ref 11.5–15.5)
WBC: 8.2 10*3/uL (ref 4.0–10.5)
nRBC: 0 % (ref 0.0–0.2)

## 2017-12-13 NOTE — Progress Notes (Signed)
Progress Note  Patient Name: Darius [name] Date of Encounter: 12/13/2017  Primary Cardiologist: No primary care provider on file.   Subjective   No CP, no SOB. Less bladder pain.   Inpatient Medications    Scheduled Meds: . atorvastatin  80 mg Oral q1800  . clopidogrel  75 mg Oral Daily  . docusate sodium  100 mg Oral BID  . finasteride  5 mg Oral Daily  . isosorbide mononitrate  30 mg Oral Daily  . losartan  12.5 mg Oral Daily   Continuous Infusions: . dextrose 5 % and 0.45 % NaCl with KCl 20 mEq/L 75 mL/hr at 12/13/17 0212  . sodium chloride irrigation 0 mL (12/13/17 0856)   PRN Meds: acetaminophen, diphenhydrAMINE **OR** diphenhydrAMINE, hyoscyamine, nitroGLYCERIN, ondansetron, opium-belladonna, oxyCODONE, zolpidem   Vital Signs    Vitals:   12/13/17 0343 12/13/17 0400 12/13/17 0700 12/13/17 0800  BP:  117/81  107/62  Pulse:  (!) 54 (!) 59 (!) 59  Resp:  14 16 17   Temp: 98.4 F (36.9 C)     TempSrc: Oral     SpO2:  97% 97% 98%  Weight:      Height:        Intake/Output Summary (Last 24 hours) at 12/13/2017 0906 Last data filed at 12/13/2017 0800 Gross per 24 hour  Intake 60365.05 ml  Output 60250 ml  Net 115.05 ml   Filed Weights   12/09/17 1748  Weight: 106.5 kg    Telemetry    Normal sinus rhythm/ brady, pauses (2 sec), Ventricular bigem. - Personally Reviewed  ECG    Sinus bradycardia with T wave inversion noted in the inferior leads- Personally Reviewed  Physical Exam   GEN: Well nourished, well developed, in no acute distress  HEENT: normal  Neck: no JVD, carotid bruits, or masses Cardiac: Loletha Grayer regular; no murmurs, rubs, or gallops,no edema  Respiratory:  clear to auscultation bilaterally, normal work of breathing GI: soft, nontender, nondistended, + BS, foley MS: no deformity or atrophy  Skin: warm and dry, no rash Neuro:  Alert and Oriented x 3, Strength and sensation are intact Psych: euthymic mood, full affect   Labs      Chemistry Recent Labs  Lab 12/09/17 2031 12/10/17 0317  NA 138 140  K 3.5 3.6  CL 106 110  CO2 23 24  GLUCOSE 108* 129*  BUN 11 10  CREATININE 0.81 0.80  CALCIUM 8.2* 8.3*  GFRNONAA >60 >60  GFRAA >60 >60  ANIONGAP 9 6     Hematology Recent Labs  Lab 12/10/17 0317  12/11/17 0829 12/12/17 0308 12/13/17 0305  WBC 8.0  --   --  9.4 8.2  RBC 2.57*  --   --  3.03* 3.17*  HGB 7.8*   < > 9.8* 9.2* 9.5*  HCT 24.9*   < > 30.6* 28.5* 30.3*  MCV 96.9  --   --  94.1 95.6  MCH 30.4  --   --  30.4 30.0  MCHC 31.3  --   --  32.3 31.4  RDW 13.5  --   --  14.6 14.3  PLT 284  --   --  312 344   < > = values in this interval not displayed.    Cardiac EnzymesNo results for input(s): TROPONINI in the last 168 hours. No results for input(s): TROPIPOC in the last 168 hours.   BNPNo results for input(s): BNP, PROBNP in the last 168 hours.   DDimer No results  for input(s): DDIMER in the last 168 hours.   Radiology    No results found.  Cardiac Studies   Cardiac catheterization 11/30/2017-stent to the proximal RCA.  Ejection fraction on echocardiogram, 65%  Patient Profile     62 y.o. male with recently diagnosed CAD with inferior STEMI 11/30/17 s/p DES x 2 to RCA, normal LVEF, HTN, HLD. Cath showed residual 60-70% mLAD, 60-70% OM2. At time of STEMI dx was noted to have gross hematuria. CT abdomen showed bladder mass and urology planned OP management. However, he was readmitted the evening of 11/27 with gross hematuria and ABL anemia with Hgb 13.3->7.8 an required catheter to avoid urinary obstruction. 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 Cardiology asked to follow in light of need for ongoing DAPT, and vague chest pressure possibly representing demand process from his anemia.  Assessment & Plan    Coronary artery disease with gross hematuria secondary to bladder mass, possible transitional cell  carcinoma - Had stenting of the proximal RCA on November 30, 2017 just a few days ago.  He developed his hematuria shortly thereafter. Has bladder mass.  - Currently trying Plavix instead of Brilinta which may have a decreased bleeding profile. - Dr. Acie Fredrickson has talked to Dr. Diona Fanti and realizes the patient will need antiplatelet therapy surrounding any urologic procedure given the recency of stent placement to try to avoid early stent thrombosis.  One possibility is to transition him to Cangrelor which is a short acting IV antiplatelet agent while in the hospital setting.  Urology will let us know if this plan needs to be enacted.  -No further chest pain.  Anemia - Hematuria.  Stable.  Monitoring. Urology notes reviewed.   Gross hematuria - Urology notes reviewed.  Bladder spasms noted.  Clots.  Bladder obstruction. Mild improvement.   Pause/bradycardia -2 seconds noted as well as 3.4 seconds.  Continue to monitor.  This occurred during the night, asymptomatic.  - I stopped Toprol 12.5mg  on 11/30.       For questions or updates, please contact [place] Please consult [email or web address] for contact info under        Signed, Candee Furbish, MD  12/13/2017, 9:06 AM

## 2017-12-13 NOTE — Progress Notes (Signed)
Subjective: He had a better night but did get irrigated for a clot.  UA is clear on CBI.  Hgb is stable.   He has no associated signs or symptoms.  ROS:  Review of Systems  All other systems reviewed and are negative.   Anti-infectives: Anti-infectives (From admission, onward)   None      Current Facility-Administered Medications  Medication Dose Route Frequency Provider Last Rate Last Dose  . acetaminophen (TYLENOL) tablet 650 mg  650 mg Oral Q4H PRN Raynelle Bring, MD      . atorvastatin (LIPITOR) tablet 80 mg  80 mg Oral q1800 Raynelle Bring, MD   80 mg at 12/12/17 1742  . clopidogrel (PLAVIX) tablet 75 mg  75 mg Oral Daily Dunn, Dayna N, PA-C   75 mg at 12/12/17 1033  . dextrose 5 % and 0.45 % NaCl with KCl 20 mEq/L infusion   Intravenous Continuous Raynelle Bring, MD 75 mL/hr at 12/13/17 0212    . diphenhydrAMINE (BENADRYL) injection 12.5 mg  12.5 mg Intravenous Q6H PRN Raynelle Bring, MD       Or  . diphenhydrAMINE (BENADRYL) 12.5 MG/5ML elixir 12.5 mg  12.5 mg Oral Q6H PRN Raynelle Bring, MD      . docusate sodium (COLACE) capsule 100 mg  100 mg Oral BID Raynelle Bring, MD   100 mg at 12/11/17 1044  . finasteride (PROSCAR) tablet 5 mg  5 mg Oral Daily Raynelle Bring, MD   5 mg at 12/12/17 1033  . hyoscyamine (LEVSIN SL) SL tablet 0.125 mg  0.125 mg Sublingual Q4H PRN Darius Seal, MD   0.125 mg at 12/13/17 0540  . isosorbide mononitrate (IMDUR) 24 hr tablet 30 mg  30 mg Oral Daily Raynelle Bring, MD   30 mg at 12/12/17 1034  . losartan (COZAAR) tablet 12.5 mg  12.5 mg Oral Daily Raynelle Bring, MD   12.5 mg at 12/12/17 1033  . nitroGLYCERIN (NITROSTAT) SL tablet 0.4 mg  0.4 mg Sublingual Q5 Min x 3 PRN Raynelle Bring, MD      . ondansetron [facility]) injection 4 mg  4 mg Intravenous Q4H PRN Raynelle Bring, MD      . opium-belladonna (B&O SUPPRETTES) 16.2-60 MG suppository 1 suppository  1 suppository Rectal Q6H PRN Raynelle Bring, MD   1 suppository at 12/12/17 0509  .  oxyCODONE (Oxy IR/ROXICODONE) immediate release tablet 5 mg  5 mg Oral Q4H PRN Raynelle Bring, MD   5 mg at 12/12/17 2359  . sodium chloride irrigation 0.9 % 3,000 mL  3,000 mL Irrigation Continuous Franchot Gallo, MD 0 mL/hr at 12/13/17 0500 3,000 mL at 12/13/17 0501  . zolpidem (AMBIEN) tablet 5 mg  5 mg Oral QHS PRN,MR X 1 Raynelle Bring, MD   5 mg at 12/12/17 0025     Objective: Vital signs in last 24 hours: Temp:  [98 F (36.7 C)-99.3 F (37.4 C)] 98.4 F (36.9 C) (12/01 0343) Pulse Rate:  [41-77] 54 (12/01 0400) Resp:  [11-23] 14 (12/01 0400) BP: (117-130)/(51-86) 117/81 (12/01 0400) SpO2:  [93 %-97 %] 97 % (12/01 0400)  Intake/Output from previous day: 11/30 0701 - 12/01 0700 In: 62907.6 [I.V.:2007.6] Out: 65200 [Urine:65200] Intake/Output this shift: Total I/O In: 4611.2 [I.V.:111.2; Other:4500] Out: 1700 [Urine:1700]   Physical Exam  Constitutional: He appears well-developed and well-nourished.  Genitourinary:  Genitourinary Comments: Urine clear in tubing on CBI.  Vitals reviewed.   Lab Results:  Recent Labs    12/12/17 0308 12/13/17 0305  WBC  9.4 8.2  HGB 9.2* 9.5*  HCT 28.5* 30.3*  PLT 312 344   BMET No results for input(s): NA, K, CL, CO2, GLUCOSE, BUN, CREATININE, CALCIUM in the last 72 hours. PT/INR No results for input(s): LABPROT, INR in the last 72 hours. ABG No results for input(s): PHART, HCO3 in the last 72 hours.  Invalid input(s): PCO2, PO2  Studies/Results: No results found.   Labs reviewed.  Path from irrigation pending.   Assessment and Plan: Bladder mass with gross hematuria on antiplatelet therapy.  Hgb stable.   He had a better night, but is back on CBI after having a clot in the night.    Continue CBI.  Dr. Diona Fanti to consider TURBT later this week.  I offered a voiding trial but he wants to wait another day.   Pt's wife continues to have numerous questions and concerns about the cardiac risks of anesthesia.          LOS: 4 days    Darius [name] 12/13/2017 [phone] ID: Darius [name], male   DOB: May 26, 1955, 62 y.o.   MRN: [id number]

## 2017-12-14 ENCOUNTER — Other Ambulatory Visit: Payer: Self-pay | Admitting: Urology

## 2017-12-14 ENCOUNTER — Ambulatory Visit: Payer: Self-pay | Admitting: Physician Assistant

## 2017-12-14 LAB — CBC
HCT: 29.6 % — ABNORMAL LOW (ref 39.0–52.0)
HEMOGLOBIN: 9 g/dL — AB (ref 13.0–17.0)
MCH: 29.5 pg (ref 26.0–34.0)
MCHC: 30.4 g/dL (ref 30.0–36.0)
MCV: 97 fL (ref 80.0–100.0)
Platelets: 356 10*3/uL (ref 150–400)
RBC: 3.05 MIL/uL — ABNORMAL LOW (ref 4.22–5.81)
RDW: 14.6 % (ref 11.5–15.5)
WBC: 8.2 10*3/uL (ref 4.0–10.5)
nRBC: 0 % (ref 0.0–0.2)

## 2017-12-14 NOTE — Plan of Care (Signed)
  Problem: Health Behavior/Discharge Planning: Goal: Ability to manage health-related needs will improve Outcome: Progressing   Problem: Clinical Measurements: Goal: Ability to maintain clinical measurements within normal limits will improve Outcome: Progressing Goal: Will remain free from infection Outcome: Progressing Goal: Diagnostic test results will improve Outcome: Progressing Goal: Respiratory complications will improve Outcome: Progressing Goal: Cardiovascular complication will be avoided Outcome: Progressing   Problem: Coping: Goal: Level of anxiety will decrease Outcome: Progressing   Problem: Elimination: Goal: Will not experience complications related to bowel motility Outcome: Progressing   Problem: Pain Managment: Goal: General experience of comfort will improve Outcome: Progressing

## 2017-12-14 NOTE — Progress Notes (Signed)
  Subjective: Patient reports occasional bladder spasm. Still needing occasional hand irrigation. CBI still going.  Objective: Vital signs in last 24 hours: Temp:  [97.9 F (36.6 C)-99 F (37.2 C)] 98.3 F (36.8 C) (12/02 0800) Pulse Rate:  [44-86] 57 (12/02 0800) Resp:  [12-29] 17 (12/02 0800) BP: (108-148)/(44-73) 127/61 (12/02 0800) SpO2:  [89 %-98 %] 95 % (12/02 0800)  Intake/Output from previous day: 12/01 0701 - 12/02 0700 In: 22729.4 [I.V.:1729.4] Out: 21250 [Urine:21250] Intake/Output this shift: Total I/O In: 222.9 [I.V.:222.9] Out: -   Physical Exam:  Constitutional: Vital signs reviewed. WD WN in NAD   Eyes: PERRL, No scleral icterus.   Cardiovascular: RRR Pulmonary/Chest: Normal effort Extremities: No cyanosis or edema   Lab Results: Recent Labs    12/12/17 0308 12/13/17 0305 12/14/17 0326  HGB 9.2* 9.5* 9.0*  HCT 28.5* 30.3* 29.6*   BMET No results for input(s): NA, K, CL, CO2, GLUCOSE, BUN, CREATININE, CALCIUM in the last 72 hours. No results for input(s): LABPT, INR in the last 72 hours. No results for input(s): LABURIN in the last 72 hours. Results for orders placed or performed during the hospital encounter of 12/09/17  MRSA PCR Screening     Status: None   Collection Time: 12/10/17  3:03 PM  Result Value Ref Range Status   MRSA by PCR NEGATIVE NEGATIVE Final    Comment:        The GeneXpert MRSA Assay (FDA approved for NASAL specimens only), is one component of a comprehensive MRSA colonization surveillance program. It is not intended to diagnose MRSA infection nor to guide or monitor treatment for MRSA infections. Performed at [facility], [place] [address], [place], [place] [address]    + Studies/Results: No results found.  Assessment/Plan:   Continued bleeding in man w/ bladder mass and MI/stenting/antiplt therapy.   I'll look at TURBT in 2 days  I'll transfer to 4th floor     LOS: 5 days   Jorja Loa 12/14/2017, 9:00 AM

## 2017-12-14 NOTE — Progress Notes (Signed)
Progress Note  Patient Name: Darius [name] Date of Encounter: 12/14/2017  Primary Cardiologist: No primary care provider on file. -Not clear, the consult note says [name] but I do not see that [name] has ever seen the patient. Dr. Tamala Julian did STEMI cath and Dr. Ellyn Hack followed during admission.  Subjective   Breathing is OK   No CP    Inpatient Medications    Scheduled Meds: . atorvastatin  80 mg Oral q1800  . clopidogrel  75 mg Oral Daily  . docusate sodium  100 mg Oral BID  . finasteride  5 mg Oral Daily  . isosorbide mononitrate  30 mg Oral Daily  . losartan  12.5 mg Oral Daily   Continuous Infusions: . dextrose 5 % and 0.45 % NaCl with KCl 20 mEq/L 75 mL/hr at 12/14/17 0800  . sodium chloride irrigation 0 mL (12/14/17 0359)   PRN Meds: acetaminophen, diphenhydrAMINE **OR** diphenhydrAMINE, hyoscyamine, nitroGLYCERIN, ondansetron, opium-belladonna, oxyCODONE, zolpidem   Vital Signs    Vitals:   12/14/17 0400 12/14/17 0600 12/14/17 0800 12/14/17 1119  BP: (!) 131/58 131/63 127/61 123/63  Pulse: (!) 54 62 (!) 57 77  Resp: 18 18 17 18   Temp: 99 F (37.2 C)  98.3 F (36.8 C) 99.3 F (37.4 C)  TempSrc: Oral  Oral Oral  SpO2: 95% 97% 95% 94%  Weight:      Height:        Intake/Output Summary (Last 24 hours) at 12/14/2017 1520 Last data filed at 12/14/2017 1327 Gross per 24 hour  Intake 14619.79 ml  Output 17400 ml  Net -2780.21 ml   Filed Weights   12/09/17 1748  Weight: 106.5 kg    Telemetry    SR   - Personally Reviewed  ECG      Physical Exam   GEN: No acute distress.   Neck: JVP is normal    Cardiac: RRR, no murmurs, rubs, or gallops.  Respiratory: Clear to auscultation bilaterally. GI: Soft, nontender, non-distended  MS: No edema; No deformity. Neuro:  Nonfocal  Psych: Normal affect   Labs    Chemistry Recent Labs  Lab 12/09/17 2031 12/10/17 0317  NA 138 140  K 3.5 3.6  CL 106 110  CO2 23 24  GLUCOSE 108* 129*  BUN 11  10  CREATININE 0.81 0.80  CALCIUM 8.2* 8.3*  GFRNONAA >60 >60  GFRAA >60 >60  ANIONGAP 9 6     Hematology Recent Labs  Lab 12/12/17 0308 12/13/17 0305 12/14/17 0326  WBC 9.4 8.2 8.2  RBC 3.03* 3.17* 3.05*  HGB 9.2* 9.5* 9.0*  HCT 28.5* 30.3* 29.6*  MCV 94.1 95.6 97.0  MCH 30.4 30.0 29.5  MCHC 32.3 31.4 30.4  RDW 14.6 14.3 14.6  PLT 312 344 356    Cardiac EnzymesNo results for input(s): TROPONINI in the last 168 hours. No results for input(s): TROPIPOC in the last 168 hours.   BNPNo results for input(s): BNP, PROBNP in the last 168 hours.   DDimer No results for input(s): DDIMER in the last 168 hours.   Radiology    No results found.  Cardiac Studies   Cardiac catheterization 11/30/2017-stent to the proximal RCA.  Ejection fraction on echocardiogram, 65%  Patient Profile     62 y.o. male with recently diagnosed CAD with inferior STEMI 11/30/17 s/p DES x 2 to RCA, normal LVEF, HTN, HLD. Cath showed residual 60-70% mLAD, 60-70% OM2. At time of STEMI dx was noted to have gross hematuria. CT  abdomen showed bladder mass and urology planned OP management. However, he was readmitted the evening of 11/27 with gross hematuria and ABL anemia with Hgb 13.3->7.8 an required catheter to avoid urinary obstruction.He has evidence of a 4 cm mass in the right side of his urinary bladder probably representing transitional cell carcinoma.Cardiology asked to follow in light of need for ongoing DAPT, and vague chest pressure possibly representing demand process from his anemia.  Assessment & Plan    CAD -Patient admitted with STEMI and found to have gross hematuria, bladder mass.  Had proximal RCA stenting on 11/30/2017.   Pt is now on clopridogrel for lower bleeding risk profile   Would continue  Can transition to Cangrelor if needed Would work with pharmacy   -Currently on single antiplatelet with clopidogrel instead of Brilinta due to lower bleeding risk profile.  Would like to avoid  early stent thrombosis.  Anemia Hgb is 9     Follow    Bladder mass -Urology plans for TURBT in approximately Wed.       For questions or updates, please contact [place] Please consult [email or web address] for contact info under        Signed, Daune Perch, NP  12/14/2017, 3:20 PM

## 2017-12-15 ENCOUNTER — Encounter (HOSPITAL_COMMUNITY): Payer: Self-pay | Admitting: Anesthesiology

## 2017-12-15 LAB — CBC
HCT: 29.2 % — ABNORMAL LOW (ref 39.0–52.0)
Hemoglobin: 9.2 g/dL — ABNORMAL LOW (ref 13.0–17.0)
MCH: 30.6 pg (ref 26.0–34.0)
MCHC: 31.5 g/dL (ref 30.0–36.0)
MCV: 97 fL (ref 80.0–100.0)
NRBC: 0 % (ref 0.0–0.2)
Platelets: 369 10*3/uL (ref 150–400)
RBC: 3.01 MIL/uL — ABNORMAL LOW (ref 4.22–5.81)
RDW: 14.4 % (ref 11.5–15.5)
WBC: 8.2 10*3/uL (ref 4.0–10.5)

## 2017-12-15 LAB — TYPE AND SCREEN
ABO/RH(D): B POS
Antibody Screen: NEGATIVE

## 2017-12-15 LAB — SURGICAL PCR SCREEN
MRSA, PCR: NEGATIVE
Staphylococcus aureus: NEGATIVE

## 2017-12-15 MED ORDER — SODIUM CHLORIDE 0.9 % IV SOLN: 2.0000 g | Freq: Once | INTRAVENOUS | Status: DC

## 2017-12-15 MED ORDER — CEFAZOLIN (ANCEF) 1 G IV SOLR: 2.0000 g | INTRAVENOUS | Status: DC

## 2017-12-15 MED ORDER — CEFAZOLIN SODIUM-DEXTROSE 2-4 GM/100ML-% IV SOLN
2.0000 g | INTRAVENOUS | Status: AC
  Administered 2017-12-16: 2 g via INTRAVENOUS
  Filled 2017-12-15 (×2): qty 100

## 2017-12-15 NOTE — Progress Notes (Signed)
Patient's informed consent for surgery tomorrow has been signed by the patient with the presence of his wife at the bedside.  Patient informed of NPO after midnight and tentative time for surgery tomorrow according to the master OR schedule at 0830. Surgical PCR also obtained.

## 2017-12-15 NOTE — Plan of Care (Signed)
  Problem: Clinical Measurements: Goal: Ability to maintain clinical measurements within normal limits will improve Outcome: Progressing   Problem: Pain Managment: Goal: General experience of comfort will improve Outcome: Progressing   

## 2017-12-15 NOTE — Progress Notes (Signed)
Subjective: CC: Hematuria and bladder mass.  Hx: Darius [name] continues to have occasional spasms but is otherwise doing well without complaints.  Urine remains light pink.  Hgb is stable at 9.2.    ROS:  Review of Systems  Constitutional: Negative for chills and fever.  Cardiovascular: Negative for chest pain.  All other systems reviewed and are negative.   Anti-infectives: Anti-infectives (From admission, onward)   Start     Dose/Rate Route Frequency Ordered Stop   12/16/17 0845  ceFAZolin (ANCEF) powder 2 g     2 g Other To Surgery 12/15/17 0830 12/17/17 0845   12/16/17 0000  cefTRIAXone (ROCEPHIN) 2 g in sodium chloride 0.9 % 100 mL IVPB  Status:  Discontinued    Note to Pharmacy:  1 hour prior to surgical procedure on 12/4.   2 g 200 mL/hr over 30 Minutes Intravenous  Once 12/15/17 0830 12/15/17 0830      Current Facility-Administered Medications  Medication Dose Route Frequency Provider Last Rate Last Dose  . acetaminophen (TYLENOL) tablet 650 mg  650 mg Oral Q4H PRN Raynelle Bring, MD      . atorvastatin (LIPITOR) tablet 80 mg  80 mg Oral q1800 Raynelle Bring, MD   80 mg at 12/14/17 1730  . [START ON 12/16/2017] ceFAZolin (ANCEF) powder 2 g  2 g Other To OR Dahlstedt, Stephen, MD      . clopidogrel (PLAVIX) tablet 75 mg  75 mg Oral Daily Dunn, Dayna N, PA-C   75 mg at 12/14/17 0900  . dextrose 5 % and 0.45 % NaCl with KCl 20 mEq/L infusion   Intravenous Continuous Raynelle Bring, MD 75 mL/hr at 12/15/17 0654    . diphenhydrAMINE (BENADRYL) injection 12.5 mg  12.5 mg Intravenous Q6H PRN Raynelle Bring, MD       Or  . diphenhydrAMINE (BENADRYL) 12.5 MG/5ML elixir 12.5 mg  12.5 mg Oral Q6H PRN Raynelle Bring, MD      . docusate sodium (COLACE) capsule 100 mg  100 mg Oral BID Raynelle Bring, MD   100 mg at 12/14/17 2108  . finasteride (PROSCAR) tablet 5 mg  5 mg Oral Daily Raynelle Bring, MD   5 mg at 12/14/17 0900  . hyoscyamine (LEVSIN SL) SL tablet 0.125 mg  0.125 mg Sublingual  Q4H PRN Irine Seal, MD   0.125 mg at 12/15/17 2841  . isosorbide mononitrate (IMDUR) 24 hr tablet 30 mg  30 mg Oral Daily Raynelle Bring, MD   30 mg at 12/14/17 0900  . losartan (COZAAR) tablet 12.5 mg  12.5 mg Oral Daily Raynelle Bring, MD   12.5 mg at 12/14/17 0900  . nitroGLYCERIN (NITROSTAT) SL tablet 0.4 mg  0.4 mg Sublingual Q5 Min x 3 PRN Raynelle Bring, MD      . ondansetron [facility]) injection 4 mg  4 mg Intravenous Q4H PRN Raynelle Bring, MD      . opium-belladonna (B&O SUPPRETTES) 16.2-60 MG suppository 1 suppository  1 suppository Rectal Q6H PRN Raynelle Bring, MD   1 suppository at 12/15/17 0130  . oxyCODONE (Oxy IR/ROXICODONE) immediate release tablet 5 mg  5 mg Oral Q4H PRN Raynelle Bring, MD   5 mg at 12/12/17 2359  . sodium chloride irrigation 0.9 % 3,000 mL  3,000 mL Irrigation Continuous Franchot Gallo, MD 0 mL/hr at 12/14/17 0359 3,000 mL at 12/15/17 0612  . zolpidem (AMBIEN) tablet 5 mg  5 mg Oral QHS PRN,MR X 1 Raynelle Bring, MD   5 mg at 12/12/17 0025  Objective: Vital signs in last 24 hours: Temp:  [98.2 F (36.8 C)-99.3 F (37.4 C)] 98.2 F (36.8 C) (12/03 0425) Pulse Rate:  [59-78] 59 (12/03 0425) Resp:  [16-18] 18 (12/03 0425) BP: (123-136)/(63-70) 136/64 (12/03 0425) SpO2:  [94 %-95 %] 95 % (12/03 0425)  Intake/Output from previous day: 12/02 0701 - 12/03 0700 In: 27250 [P.O.:480; I.V.:1870] Out: 28225 [Urine:28225] Intake/Output this shift: Total I/O In: 2100 [Other:2100] Out: 2375 [Urine:2375]   Physical Exam  Constitutional: He appears well-developed and well-nourished.  Genitourinary:  Genitourinary Comments: Urine pink in foley tubing.   Vitals reviewed.   Lab Results:  Recent Labs    12/14/17 0326 12/15/17 0548  WBC 8.2 8.2  HGB 9.0* 9.2*  HCT 29.6* 29.2*  PLT 356 369   BMET No results for input(s): NA, K, CL, CO2, GLUCOSE, BUN, CREATININE, CALCIUM in the last 72 hours. PT/INR No results for input(s): LABPROT, INR in the  last 72 hours. ABG No results for input(s): PHART, HCO3 in the last 72 hours.  Invalid input(s): PCO2, PO2  Studies/Results: No results found.  Labs reviewed.   Assessment and Plan: Bladder mass with hematuria and acute blood loss anemia.  He is to have TURBT on 12/4.     CAD with recent STEMI and drug eluting stent.  Continue plavix.        LOS: 6 days    Irine Seal 12/15/2017 [phone] ID: Darius [name], male   DOB: 1955-03-31, 62 y.o.   MRN: [id number]

## 2017-12-15 NOTE — Progress Notes (Addendum)
Progress Note  Patient Name: Darius [name] Date of Encounter: 12/15/2017  Primary Cardiologist: No primary care provider on file.   Subjective   No chest discomfort or shortness of breath  Inpatient Medications    Scheduled Meds: . atorvastatin  80 mg Oral q1800  . clopidogrel  75 mg Oral Daily  . docusate sodium  100 mg Oral BID  . finasteride  5 mg Oral Daily  . isosorbide mononitrate  30 mg Oral Daily  . losartan  12.5 mg Oral Daily   Continuous Infusions: . [START ON 12/16/2017]  ceFAZolin (ANCEF) IV    . dextrose 5 % and 0.45 % NaCl with KCl 20 mEq/L 75 mL/hr at 12/15/17 0654  . sodium chloride irrigation 0 mL (12/14/17 0359)   PRN Meds: acetaminophen, diphenhydrAMINE **OR** diphenhydrAMINE, hyoscyamine, nitroGLYCERIN, ondansetron, opium-belladonna, oxyCODONE, zolpidem   Vital Signs    Vitals:   12/14/17 0800 12/14/17 1119 12/14/17 2054 12/15/17 0425  BP: 127/61 123/63 129/70 136/64  Pulse: (!) 57 77 78 (!) 59  Resp: 17 18 16 18   Temp: 98.3 F (36.8 C) 99.3 F (37.4 C) 99.3 F (37.4 C) 98.2 F (36.8 C)  TempSrc: Oral Oral Oral Oral  SpO2: 95% 94% 95% 95%  Weight:      Height:        Intake/Output Summary (Last 24 hours) at 12/15/2017 1217 Last data filed at 12/15/2017 1151 Gross per 24 hour  Intake 32127.1 ml  Output 36500 ml  Net -4372.9 ml   Filed Weights   12/09/17 1748  Weight: 106.5 kg    Telemetry    NSR, 50's overnight, 70's-80's today.  - Personally Reviewed  ECG    No new tracings- Personally Reviewed  Physical Exam   GEN: No acute distress.   Neck: No JVD Cardiac: RRR, no murmurs, rubs, or gallops.  Respiratory: Clear to auscultation bilaterally. GI: Soft, nontender, non-distended  MS: No edema; No deformity. Neuro:  Nonfocal  Psych: Normal affect   Labs    Chemistry Recent Labs  Lab 12/09/17 2031 12/10/17 0317  NA 138 140  K 3.5 3.6  CL 106 110  CO2 23 24  GLUCOSE 108* 129*  BUN 11 10  CREATININE 0.81 0.80    CALCIUM 8.2* 8.3*  GFRNONAA >60 >60  GFRAA >60 >60  ANIONGAP 9 6     Hematology Recent Labs  Lab 12/13/17 0305 12/14/17 0326 12/15/17 0548  WBC 8.2 8.2 8.2  RBC 3.17* 3.05* 3.01*  HGB 9.5* 9.0* 9.2*  HCT 30.3* 29.6* 29.2*  MCV 95.6 97.0 97.0  MCH 30.0 29.5 30.6  MCHC 31.4 30.4 31.5  RDW 14.3 14.6 14.4  PLT 344 356 369    Cardiac EnzymesNo results for input(s): TROPONINI in the last 168 hours. No results for input(s): TROPIPOC in the last 168 hours.   BNPNo results for input(s): BNP, PROBNP in the last 168 hours.   DDimer No results for input(s): DDIMER in the last 168 hours.   Radiology    No results found.  Cardiac Studies   Cardiac catheterization 11/30/2017-stent to the proximal RCA. Ejection fraction on echocardiogram, 65%  Patient Profile     62 y.o. male with recently diagnosed CAD with inferior STEMI 11/30/17 s/p DES x 2 to RCA, normal LVEF, HTN, HLD. Cath showed residual 60-70% mLAD, 60-70% OM2. At time of STEMI dx was noted to have gross hematuria. CT abdomen showed bladder mass and urology planned OP management. However, he was readmitted the evening  of 11/27 with gross hematuria and ABL anemia with Hgb 13.3->7.8 an required catheter to avoid urinary obstruction.He has evidence of a 4 cm mass in the right side of his urinary bladder probably representing transitional cell carcinoma.Cardiology asked to follow in light of need for ongoing DAPT, and vague chest pressure possibly representing demand process from his anemia.  Assessment & Plan    CAD -Patient admitted with STEMI and found to have gross hematuria, bladder mass.  Had proximal RCA stenting on 11/30/2017. -Patient is currently treated with clopidogrel instead of Brilinta due to lower bleeding risk profile.  Would like to avoid early stent thrombosis.  Could consider transition to Cangrelor if needed with pharmacy to assist, but it appears that urology is okay with continuing clopidogrel. -No  complaints of chest pain or shortness of breath.   Anemia -Hemoglobin is stable at 9.2  Bladder mass -Urology plans for TURBT tomorrow     For questions or updates, please contact [place] Please consult [email or web address] for contact info under        Signed, Daune Perch, NP  12/15/2017, 12:17 PM    Pt seen and examined  Agree with findings of [name] Pt comfortable in bed     Lungs are CTA Cardiac RRR   No murmurs Abd is supple Ext without edema  Note plans for surgery tomorrow COntinue Plavix  Will follow peripherially in perioperative period    Dorris Carnes

## 2017-12-15 NOTE — Progress Notes (Signed)
Did not receive return call from Card Master earlier, pt has remained stable since that time.  Paged APP to make aware of patient's earlier rhythm in lieu of surgery in a.m.  No immediate needs present and APP acknowledged. Andre Lefort

## 2017-12-15 NOTE — Progress Notes (Signed)
CCMD notified RN that patient's heart rate dropped into the upper 30's-40's.   A few flutter/fib waves present but for less than a minute in duration.  Pt in NSR/ Junctional at present, rate in upper 50's.  Engineer, maintenance (IT) paged. Andre Lefort

## 2017-12-15 NOTE — Progress Notes (Signed)
Patient's type and screen/blood bank bracelet expired 12/1 and patient is scheduled for surgery tomorrow 12/4 with Dr. Diona Fanti. MD paged to see if a new Type and Screen should be obtained. New orders received. Will continue to monitor.

## 2017-12-16 ENCOUNTER — Encounter (HOSPITAL_COMMUNITY)
Admission: AD | Disposition: A | Discharge status: Home / Self Care | Payer: Self-pay | Source: Home / Self Care | Attending: Urology

## 2017-12-16 ENCOUNTER — Inpatient Hospital Stay (HOSPITAL_COMMUNITY): Payer: Medicaid Other | Admitting: Anesthesiology

## 2017-12-16 ENCOUNTER — Encounter (HOSPITAL_COMMUNITY): Payer: Self-pay | Admitting: Certified Registered Nurse Anesthetist

## 2017-12-16 ENCOUNTER — Inpatient Hospital Stay (HOSPITAL_COMMUNITY): Payer: Medicaid Other

## 2017-12-16 HISTORY — PX: TRANSURETHRAL RESECTION OF BLADDER TUMOR: SHX2575

## 2017-12-16 LAB — CBC
HEMATOCRIT: 29.9 % — AB (ref 39.0–52.0)
Hemoglobin: 9.3 g/dL — ABNORMAL LOW (ref 13.0–17.0)
MCH: 30.1 pg (ref 26.0–34.0)
MCHC: 31.1 g/dL (ref 30.0–36.0)
MCV: 96.8 fL (ref 80.0–100.0)
Platelets: 290 10*3/uL (ref 150–400)
RBC: 3.09 MIL/uL — AB (ref 4.22–5.81)
RDW: 14.4 % (ref 11.5–15.5)
WBC: 7.7 10*3/uL (ref 4.0–10.5)
nRBC: 0 % (ref 0.0–0.2)

## 2017-12-16 SURGERY — TURBT (TRANSURETHRAL RESECTION OF BLADDER TUMOR)
Anesthesia: General

## 2017-12-16 MED ORDER — STERILE WATER FOR IRRIGATION IR SOLN
Status: DC | PRN
  Administered 2017-12-16: 9000 mL

## 2017-12-16 MED ORDER — PROPOFOL 10 MG/ML IV BOLUS
INTRAVENOUS | Status: DC | PRN
  Administered 2017-12-16: 170 mg via INTRAVENOUS

## 2017-12-16 MED ORDER — BELLADONNA ALKALOIDS-OPIUM 16.2-30 MG RE SUPP
RECTAL | Status: AC
  Filled 2017-12-16: qty 1

## 2017-12-16 MED ORDER — PROMETHAZINE HCL 25 MG/ML IJ SOLN: 6.2500 mg | INTRAMUSCULAR | Status: DC | PRN

## 2017-12-16 MED ORDER — FENTANYL CITRATE (PF) 100 MCG/2ML IJ SOLN
INTRAMUSCULAR | Status: AC
  Filled 2017-12-16: qty 2

## 2017-12-16 MED ORDER — LIDOCAINE HCL URETHRAL/MUCOSAL 2 % EX GEL
CUTANEOUS | Status: AC
  Filled 2017-12-16: qty 5

## 2017-12-16 MED ORDER — ONDANSETRON HCL 4 MG/2ML IJ SOLN
INTRAMUSCULAR | Status: AC
  Filled 2017-12-16: qty 2

## 2017-12-16 MED ORDER — FENTANYL CITRATE (PF) 100 MCG/2ML IJ SOLN
INTRAMUSCULAR | Status: DC | PRN
  Administered 2017-12-16 (×4): 50 ug via INTRAVENOUS

## 2017-12-16 MED ORDER — HYDROMORPHONE HCL 1 MG/ML IJ SOLN
INTRAMUSCULAR | Status: AC
  Filled 2017-12-16: qty 1

## 2017-12-16 MED ORDER — MEPERIDINE HCL 50 MG/ML IJ SOLN: 6.2500 mg | INTRAMUSCULAR | Status: DC | PRN

## 2017-12-16 MED ORDER — LACTATED RINGERS IV SOLN
INTRAVENOUS | Status: DC
  Administered 2017-12-16: 08:00:00 via INTRAVENOUS

## 2017-12-16 MED ORDER — MIDAZOLAM HCL 2 MG/2ML IJ SOLN
INTRAMUSCULAR | Status: AC
  Filled 2017-12-16: qty 2

## 2017-12-16 MED ORDER — PROPOFOL 10 MG/ML IV BOLUS
INTRAVENOUS | Status: AC
  Filled 2017-12-16: qty 20

## 2017-12-16 MED ORDER — HYDROMORPHONE HCL 1 MG/ML IJ SOLN
0.2500 mg | INTRAMUSCULAR | Status: DC | PRN
  Administered 2017-12-16 (×2): 0.5 mg via INTRAVENOUS

## 2017-12-16 SURGICAL SUPPLY — 23 items
BAG URINE DRAINAGE (UROLOGICAL SUPPLIES) IMPLANT
BAG URO CATCHER STRL LF (MISCELLANEOUS) ×2 IMPLANT
CATH FOLEY 2WAY SLVR 30CC 24FR (CATHETERS) IMPLANT
CATH FOLEY 3WAY 30CC 24FR (CATHETERS) ×2
CATH URTH STD 24FR FL 3W 2 (CATHETERS) ×1 IMPLANT
COVER WAND RF STERILE (DRAPES) IMPLANT
ELECT REM PT RETURN 15FT ADLT (MISCELLANEOUS) ×2 IMPLANT
EVACUATOR MICROVAS BLADDER (UROLOGICAL SUPPLIES) IMPLANT
GLOVE BIOGEL M 8.0 STRL (GLOVE) ×2 IMPLANT
GLOVE BIOGEL PI IND STRL 7.0 (GLOVE) ×1 IMPLANT
GLOVE BIOGEL PI INDICATOR 7.0 (GLOVE) ×1
GLOVE ECLIPSE 6.5 STRL STRAW (GLOVE) ×2 IMPLANT
GOWN STRL REUS W/ TWL XL LVL3 (GOWN DISPOSABLE) ×1 IMPLANT
GOWN STRL REUS W/TWL XL LVL3 (GOWN DISPOSABLE) ×4 IMPLANT
LOOP MONOPOLAR YLW (ELECTROSURGICAL) ×2 IMPLANT
MANIFOLD NEPTUNE II (INSTRUMENTS) ×2 IMPLANT
NDL SAFETY ECLIPSE 18X1.5 (NEEDLE) IMPLANT
NEEDLE HYPO 18GX1.5 SHARP (NEEDLE)
PACK CYSTO (CUSTOM PROCEDURE TRAY) ×2 IMPLANT
SET ASPIRATION TUBING (TUBING) IMPLANT
SYRINGE IRR TOOMEY STRL 70CC (SYRINGE) ×2 IMPLANT
TUBING CONNECTING 10 (TUBING) ×2 IMPLANT
TUBING UROLOGY SET (TUBING) ×2 IMPLANT

## 2017-12-16 NOTE — Anesthesia Preprocedure Evaluation (Addendum)
Anesthesia Evaluation  Patient identified by MRN, date of birth, ID band Patient awake    Reviewed: Allergy & Precautions, NPO status , Patient's Chart, lab work & pertinent test results  Airway Mallampati: I       Dental  (+) Edentulous Upper, Poor Dentition   Pulmonary Current Smoker,       rales    Cardiovascular hypertension, Pt. on medications and Pt. on home beta blockers + CAD and + Past MI  Normal cardiovascular exam Rhythm:Regular Rate:Normal     Neuro/Psych negative neurological ROS  negative psych ROS   GI/Hepatic negative GI ROS, Neg liver ROS,   Endo/Other  negative endocrine ROS  Renal/GU negative Renal ROS     Musculoskeletal negative musculoskeletal ROS (+)   Abdominal (+) + obese,   Peds  Hematology  (+) anemia ,   Anesthesia Other Findings [facility]  CARDIAC CATHETERIZATION  Order# [id number]  Reading physician: Belva Crome, MD Ordering physician: Belva Crome, MD Study date: 11/30/17 Physicians  [facility]  ECHO COMPLETE WO IMAGING ENHANCING AGENT  Order# [id number]  Reading physician: Pixie Casino, MD Ordering physician: Belva Crome, MD Study date: 11/30/17 Study Result   Result status: Final result                             *[facility]*                         [address], [place] [address]                            [phone]  ------------------------------------------------------------------- Transthoracic Echocardiography  Patient:    Darius [name], Darius [name] MR #:       [id number] Study Date: 11/30/2017 Gender:     M Age:        62 Height:     175.3 cm Weight:     111 kg BSA:        2.37 m^2 Pt. Status: Room:       [facility]   ATTENDING    Belva Crome, MD  ORDERING     Belva Crome, MD  REFERRING    Belva Crome, MD  ADMITTING    Ena Dawley, M.D.   PERFORMING   [facility], Inpatient  SONOGRAPHER  [facility]  REFERRING    Rudean Curt  cc:  ------------------------------------------------------------------- LV EF: 60% -   65%  ------------------------------------------------------------------- Indications:      MI - acute 410.91.  ------------------------------------------------------------------- History:   Risk factors:  Current tobacco use. Hypertension.  ------------------------------------------------------------------- Study Conclusions  - Left ventricle: The cavity size was normal. Wall thickness was   increased in a pattern of moderate LVH. Systolic function was   normal. The estimated ejection fraction was in the range of 60%   to 65%. Inferobasal hypokinesis. The study is not technically   sufficient to allow evaluation of LV diastolic function. - Mitral valve: Mildly thickened leaflets . There was trivial   regurgitation. - Left atrium: The atrium was normal in size. - Inferior vena cava:  The vessel was dilated. The respirophasic   diameter changes were blunted (< 50%), consistent with elevated   central venous pressure.  Impressions:  - LVEF 60-65%, inferobasal hypokinesis, moderate LVH, trivial MR,   normal LA size, dilated IVC.  ------------------------------------------------------------------- Study data:  No prior study was available for comparison.  Study status:  Routine.  Procedure:  The patient reported no pain pre or post test. Transthoracic echocardiography. Image quality was adequate.  Study completion:  There were no complications. Transthoracic echocardiography.  M-mode, complete 2D, spectral Doppler, and color Doppler.  Birthdate:  Patient birthdate: [birth year].  Age:  Patient is 62 yr old.  Sex:  Gender: male. BMI: 36.1 kg/m^2.  Blood pressure:     116/68  Patient status: Inpatient.  Study date:  Study date: 11/30/2017. Study time: 03:54 PM.  Location:  Echo  laboratory.  -------------------------------------------------------------------  ------------------------------------------------------------------- Left ventricle:  The cavity size was normal. Wall thickness was increased in a pattern of moderate LVH. Systolic function was normal. The estimated     Panel Physicians Referring Physician Case Authorizing Physician Belva Crome, MD (Primary)   Procedures   Coronary/Graft Acute MI Revascularization LEFT HEART CATH AND CORONARY ANGIOGRAPHY Conclusion     A stent was successfully placed.  A stent was successfully placed.    Inferior ST elevation myocardial infarction due to 100% occlusion of the proximal RCA.  Successful therapy with proximal and distal vessel stenting reducing 100% and 80% stenoses to 10% and 0% respectively with improvement in TIMI flow from 0 to III.  22 x 4.0 mm Onyx stents were used with the proximal stent being postdilated to 4.5 mm in diameter.  Normal left main.  LAD contains segmental 60 to 70% mid vessel narrowing.  Large normal ramus intermedius.  Circumflex gives origin to 2 small obtuse marginal branches with a second marginal containing 60 to 70% diffuse disease in the mid vessel.  Inferobasal akinesis, overall LVEF greater than 60%.  LVEDP 20.  Delayed reperfusion due to tortuous, angulated innominate/aortic arch anatomy.  Procedure complicated by Bezold-Jarisch response with profound hypotension and bradycardia requiring intravenous atropine and IV Levophed for heart rate and blood pressure support.  RECOMMENDATIONS:   IV fluid at a rate of 100 cc/h.  Wean Levophed once blood pressure is consistently above 100 mmHg.  Continue IV ReoPro for 8 hours.  Aspirin and Plavix dual antiplatelet therapy for 12 months.  High intensity statin therapy.  Institute beta-blocker therapy as tolerated by heart rate and blood pressure.  Consider angiotensin converting enzyme inhibitor or  angiotensin receptor blocker for blood pressure control if required.  Patient has been on diuretic therapy as monotherapy for hypertension.  Smoking cessation.    Recommend uninterrupted dual antiplatelet therapy with Aspirin 81mg  daily and Ticagrelor 90mg  twice daily for a minimum of 12 months (ACS - Class I recommendation). Indications     Reproductive/Obstetrics                          Anesthesia Physical Anesthesia Plan  ASA: IV  Anesthesia Plan: General   Post-op Pain Management:    Induction: Intravenous  PONV Risk Score and Plan: Ondansetron, Dexamethasone and Midazolam  Airway Management Planned: LMA  Additional Equipment:   Intra-op Plan:   Post-operative Plan: Extubation in OR  Informed Consent: I have reviewed the patients History and Physical, chart, labs and discussed the procedure including the risks, benefits and alternatives for the proposed anesthesia with the patient  or authorized representative who has indicated his/her understanding and acceptance.   Dental advisory given  Plan Discussed with: CRNA and Surgeon  Anesthesia Plan Comments:         Anesthesia Quick Evaluation

## 2017-12-16 NOTE — Progress Notes (Signed)
Day of Surgery Subjective: Patient reports feeling OK  Objective: Vital signs in last 24 hours: Temp:  [97.4 F (36.3 C)-99.9 F (37.7 C)] 98.3 F (36.8 C) (12/04 1508) Pulse Rate:  [58-77] 59 (12/04 1506) Resp:  [12-20] 17 (12/04 1508) BP: (106-137)/(49-70) 132/57 (12/04 1508) SpO2:  [91 %-100 %] 92 % (12/04 1508) Weight:  [104.3 kg] 104.3 kg (12/04 0755)  Intake/Output from previous day: 12/03 0701 - 12/04 0700 In: 25500 [I.V.:1800] Out: 29650 [Urine:29650] Intake/Output this shift: Total I/O In: 2645.5 [I.V.:870.5; Other:1775] Out: 3750 [Urine:3650; Blood:100]  Physical Exam:  Constitutional: Vital signs reviewed. WD WN in NAD   Eyes: PERRL, No scleral icterus.   Cardiovascular: RRR Pulmonary/Chest: Normal effort Extremities: No cyanosis or edema   Irrigant clear   Lab Results: Recent Labs    12/14/17 0326 12/15/17 0548 12/16/17 0534  HGB 9.0* 9.2* 9.3*  HCT 29.6* 29.2* 29.9*   BMET No results for input(s): NA, K, CL, CO2, GLUCOSE, BUN, CREATININE, CALCIUM in the last 72 hours. No results for input(s): LABPT, INR in the last 72 hours. No results for input(s): LABURIN in the last 72 hours. Results for orders placed or performed during the hospital encounter of 12/09/17  MRSA PCR Screening     Status: None   Collection Time: 12/10/17  3:03 PM  Result Value Ref Range Status   MRSA by PCR NEGATIVE NEGATIVE Final    Comment:        The GeneXpert MRSA Assay (FDA approved for NASAL specimens only), is one component of a comprehensive MRSA colonization surveillance program. It is not intended to diagnose MRSA infection nor to guide or monitor treatment for MRSA infections. Performed at [facility], [place] [address] [place], [place] [address]   Surgical pcr screen     Status: None   Collection Time: 12/15/17  9:57 AM  Result Value Ref Range Status   MRSA, PCR NEGATIVE NEGATIVE Final   Staphylococcus aureus NEGATIVE NEGATIVE Final   Comment: (NOTE) The Xpert SA Assay (FDA approved for NASAL specimens in patients 67 years of age and older), is one component of a comprehensive surveillance program. It is not intended to diagnose infection nor to guide or monitor treatment. Performed at [facility], [place] [address] [place], [place] [address]     Studies/Results: Dg Chest Port 1 View  Result Date: 12/16/2017 CLINICAL DATA:  Oxygen desaturation EXAM: PORTABLE CHEST 1 VIEW COMPARISON:  11/30/2017 FINDINGS: Mild cardiac enlargement. Improved aeration of the lung bases. Interval clearing of bibasilar airspace disease. No effusion. Right paratracheal soft tissue masses unchanged. Possible goiter or adenopathy. Trachea not significantly displaced. IMPRESSION: Interval clearing of bilateral airspace disease since the prior study Right paratracheal soft tissue mass unchanged. Possible goiter or adenopathy. Comparison with prior chest x-ray suggested for stability. If none are available, CT chest with contrast recommended for further evaluation. Electronically Signed   By: Franchot Gallo M.D.   On: 12/16/2017 08:47    Assessment/Plan:   TURBT today while on plavix. His irrigant is clear. Might be able to d/c foley in am   LOS: 7 days   Darius [name] 12/16/2017, 5:27 PM

## 2017-12-16 NOTE — Transfer of Care (Signed)
Immediate Anesthesia Transfer of Care Note  Patient: Darius [name]  Procedure(s) Performed: TRANSURETHRAL RESECTION OF BLADDER TUMOR (TURBT) (N/A )  Patient Location: PACU  Anesthesia Type:General  Level of Consciousness: awake and drowsy  Airway & Oxygen Therapy: Patient Spontanous Breathing and Patient connected to face mask oxygen  Post-op Assessment: Report given to RN and Post -op Vital signs reviewed and stable  Post vital signs: Reviewed and stable  Last Vitals:  Vitals Value Taken Time  BP    Temp    Pulse 72 12/16/2017  9:52 AM  Resp 15 12/16/2017  9:52 AM  SpO2 99 % 12/16/2017  9:52 AM  Vitals shown include unvalidated device data.  Last Pain:  Vitals:   12/16/17 0755  TempSrc:   PainSc: 0-No pain      Patients Stated Pain Goal: 4 (32/76/14 7092)  Complications: No apparent anesthesia complications

## 2017-12-16 NOTE — Op Note (Signed)
Preoperative diagnosis: Greater than 5 cm bladder mass, gross hematuria  Postoperative diagnosis: Greater than 5 cm bladder tumor, gross hematuria, large bladder clot  Principal procedure: Cystoscopy, clot evacuation, TURBT greater than 5 cm bladder tumor  Surgeon: Onica Davidovich  Anesthesia: General with LMA  Complications: None  Specimen: Bladder tumor, to pathology  Estimated blood loss: Less than 25 mL  Drains: 24 French three-way Foley catheter, to CBI  Indications: 62 year old [place] man who is 3 weeks out from an MI, subsequent stenting and placement on antiplatelet therapy.  The patient had antecedent gross hematuria, this has been made worse by the antiplatelet therapy.  Hematuria evaluation included cystoscopy and hematuria protocol CT scan.  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  Cystoscopy confirmed this.  He presents at this time for urgent TURBT.  The patient does have a dire situation with him having had a recent MI, he is on antiplatelet therapy that cannot be discontinued, and this ongoing bleeding and probable neoplasm in his bladder.  I have discussed the procedure of TURBT with the patient, and have cleared him with cardiology to undergo anesthetic to remove this.  The patient understands the risks involved, including continued bleeding, recurrent MI as well as anesthetic complications.  He understands these and desires to proceed.  Findings: There was a large bladder tumor on the right lateral wall just above the trigone.  This obscured the right ureteral orifice.  There was a large bladder clot in the bladder lumen approximately 6 to 7 cm in size.  The clot was subsequently evacuated.  No other urothelial lesions were noted save for the right bladder wall tumor.  Description of procedure: The patient was properly identified in the holding area.  He received preoperative IV Ancef.  He was taken to the operating room where  general anesthetic was administered with the LMA.  He is placed in the dorsolithotomy position.  Genitalia and perineum were prepped and draped.  Proper timeout was performed.  23 French resectoscope sheath was passed with the visual obturator.  The bladder was entered and inspected circumferentially.  The large bladder clot was seen.  This was subsequently evacuated using Toomey syringe.  The bladder was then fully inspected, no other lesions were seen other than the bladder tumor.  This was papillary in nature.  This was then resected down to the muscular layer.  Following resection, the right ureteral orifice was evident and was not injured.  There was a mild amount of bleeding during the procedure.  However, once the tumor was resected down to the base, careful coagulation was performed and hemostasis was achieved.  I actually overdid the coagulation as the patient will be on Plavix during the remaining hospitalization.  The effluent was with clear, colorless at the end of the procedure.  All tumor fragments were irrigated from the bladder and sent for pathology.  At this point, there being adequate hemostasis, the scope was removed.  A 24 French three-way Foley catheter was placed with the balloon filled with 30 cc of water and hooked to CBI.  At this point, the procedure was terminated.  The patient was awakened and then taken to the PACU in stable condition.  He tolerated the procedure well.

## 2017-12-16 NOTE — Anesthesia Postprocedure Evaluation (Signed)
Anesthesia Post Note  Patient: Darius [name]  Procedure(s) Performed: TRANSURETHRAL RESECTION OF BLADDER TUMOR (TURBT) (N/A )     Patient location during evaluation: PACU Anesthesia Type: General Level of consciousness: sedated Pain management: pain level controlled Vital Signs Assessment: post-procedure vital signs reviewed and stable Respiratory status: spontaneous breathing Cardiovascular status: stable Postop Assessment: no apparent nausea or vomiting Anesthetic complications: no    Last Vitals:  Vitals:   12/16/17 1100 12/16/17 1109  BP: (!) 107/50 109/63  Pulse: 60 (!) 58  Resp: 14 14  Temp:  36.9 C  SpO2: 96% 92%    Last Pain:  Vitals:   12/16/17 1109  TempSrc: Oral  PainSc:    Pain Goal: Patients Stated Pain Goal: 4 (12/16/17 0755)               Huston Foley

## 2017-12-16 NOTE — Progress Notes (Signed)
Dr. Diona Fanti at bedside speaking to pt. Aware of chest x ray that was taken.  Waiting on results.

## 2017-12-16 NOTE — Progress Notes (Addendum)
Findings from surgery noted   Urine is now clear Remains on plavix    No symptoms of angina   He had been sent home from [place] on 11/22 with Brilinta alone, no ASA because of hematuria Optimally would be on both platelet inhibitors.  ASA and PLavix  Will defer to urology opinion for combo based on findings  Dorris Carnes

## 2017-12-16 NOTE — Progress Notes (Addendum)
Dr. Jillyn Hidden notified that pts 02 sats on room air were 86-89. With deep breathing pt's sats reached 94 percent on RA but quickly desaturated with normal respiration.  Pt was placed on 2L 02 [place] and is average 93-95%. Pts breathing is regular, symmetrical and unlabored at this time.

## 2017-12-16 NOTE — Anesthesia Procedure Notes (Signed)
Procedure Name: LMA Insertion Date/Time: 12/16/2017 8:47 AM Performed by: [place] Deisi Salonga C, CRNA Pre-anesthesia Checklist: Patient identified, Emergency Drugs available, Suction available and Patient being monitored Patient Re-evaluated:Patient Re-evaluated prior to induction Oxygen Delivery Method: Circle system utilized Preoxygenation: Pre-oxygenation with 100% oxygen Induction Type: IV induction Ventilation: Mask ventilation without difficulty LMA: LMA inserted LMA Size: 4.0 Number of attempts: 1 Airway Equipment and Method: Bite block Placement Confirmation: positive ETCO2 Tube secured with: Tape Dental Injury: Teeth and Oropharynx as per pre-operative assessment

## 2017-12-17 ENCOUNTER — Encounter (HOSPITAL_COMMUNITY): Payer: Self-pay | Admitting: Urology

## 2017-12-17 DIAGNOSIS — I10 Essential (primary) hypertension: Secondary | ICD-10-CM

## 2017-12-17 DIAGNOSIS — R001 Bradycardia, unspecified: Secondary | ICD-10-CM

## 2017-12-17 MED ORDER — CLOPIDOGREL BISULFATE 75 MG PO TABS: 75.0000 mg | ORAL_TABLET | Freq: Every day | ORAL | 11 refills | Status: DC

## 2017-12-17 MED ORDER — SULFAMETHOXAZOLE-TRIMETHOPRIM 800-160 MG PO TABS: 1.0000 | ORAL_TABLET | Freq: Two times a day (BID) | ORAL | 0 refills | Status: DC

## 2017-12-17 MED ORDER — ASPIRIN EC 81 MG PO TBEC: 81.0000 mg | DELAYED_RELEASE_TABLET | Freq: Every day | ORAL | 2 refills | Status: DC

## 2017-12-17 NOTE — Progress Notes (Signed)
Requested by cardiology to ambulate patient in the hall and to assess for dizziness. As soon as the patient stood up he felt slightly dizzy but quickly subsided.  We ambulated in the hall and walked down to the window where he stood for several minutes without any dizziness after the initial one. Patient's heart rate maintained in the upper 80s/low 90s while ambulating in the hall. Patient says he feels ready to go home. Cardiology made aware of the initial dizziness who stated she agreed with discharge and will follow up with him outpatient.   Reviewed with patient and patient's wife at length his discharge paperwork regarding which medications to stop and which new ones to start.  Wrote down when each medication is due and what he needed to take for today. Reviewed discharge instructions from his TURBT procedure as well. IVs/telemetry discontinued. Patient is ready for d/c.

## 2017-12-17 NOTE — Discharge Summary (Signed)
Patient ID: Darius [name] MRN: [id number] DOB/AGE: [birth year] 62 y.o.  Admit date: 12/09/2017 Discharge date: 12/17/2017  Primary Care Physician:  Lars Mage, MD  Discharge Diagnoses:  Bladder cancer--lateral wall Present on Admission: . Hematuria * Anemia from hemorrhage *coronary artery disease  Consults:  Cardiology   Discharge Medications: Allergies as of 12/17/2017   No Known Allergies     Medication List    STOP taking these medications   finasteride 5 MG tablet Commonly known as:  PROSCAR   ticagrelor 90 MG Tabs tablet Commonly known as:  BRILINTA     TAKE these medications   atorvastatin 80 MG tablet Commonly known as:  LIPITOR Take 1 tablet (80 mg total) by mouth daily at 6 PM.   clopidogrel 75 MG tablet Commonly known as:  PLAVIX Take 1 tablet (75 mg total) by mouth daily.   isosorbide mononitrate 30 MG 24 hr tablet Commonly known as:  IMDUR Take 1 tablet (30 mg total) by mouth daily.   losartan 25 MG tablet Commonly known as:  COZAAR Take 0.5 tablets (12.5 mg total) by mouth daily.   metoprolol succinate 25 MG 24 hr tablet Commonly known as:  TOPROL-XL Take 0.5 tablets (12.5 mg total) by mouth daily.   nitroGLYCERIN 0.4 MG SL tablet Commonly known as:  NITROSTAT Place 1 tablet (0.4 mg total) under the tongue every 5 (five) minutes x 3 doses as needed for up to 10 days for chest pain.   sulfamethoxazole-trimethoprim 800-160 MG tablet Commonly known as:  BACTRIM DS,SEPTRA DS Take 1 tablet by mouth 2 (two) times daily.        Significant Diagnostic Studies:  No results found.  Brief H and P: For complete details please refer to admission H and P, but in brief the pt was admitted for persistent bleeding from a bladder mass and his antiplatelet therapy following recent MI.  Hospital Course: He underwent bladder irrigation, transfusion of 2 units PRBCs as well as changing antiplatelet therapy to plavix. HE then undetrwent TURBT on  12.5.2019. Anesthetic procedure/postop course were uncomplicated. Catheter was removed POD 1 and he voided adequately. He was d/ced in stable condition. Active Problems:   Hematuria   Day of Discharge BP 134/83 (BP Location: Right Arm)   Pulse 77   Temp 98.1 F (36.7 C) (Oral)   Resp 14   Ht 5\' 9"  (1.753 m)   Wt 104.3 kg   SpO2 91%   BMI 33.97 kg/m   No results found for this or any previous visit (from the past 24 hour(s)).  Physical Exam: General: Alert and awake oriented x3 not in any acute distress. HEENT: anicteric sclera, pupils reactive to light and accommodation CVS: S1-S2 clear no murmur rubs or gallops Chest: clear to auscultation bilaterally, no wheezing rales or rhonchi Abdomen: soft nontender, nondistended, normal bowel sounds, no organomegaly Extremities: no cyanosis, clubbing or edema noted bilaterally Neuro: Cranial nerves II-XII intact, no focal neurological deficits  Disposition:  Home   Diet:  Low fat  Activity:  Discussed w/ pt/wife   Disposition and Follow-up:     Followup will be arranged  TESTS THAT NEED FOLLOW-UP   Path review  DISCHARGE FOLLOW-UP  Follow-up Information    Franchot Gallo, MD Follow up.   Specialty:  Urology Why:  We will call you to set up appt Contact information: [place]  [phone]           Time spent on Discharge:  20 mins  Signed: Lillette Boxer Loralie Malta 12/17/2017, 8:49 AM

## 2017-12-17 NOTE — Progress Notes (Signed)
Progress Note  Patient Name: Darius [name] Date of Encounter: 12/17/2017  Primary Cardiologist:  Darius [name]    Subjective   Breathing is OK  NO CP   Inpatient Medications    Scheduled Meds: . clopidogrel  75 mg Oral Daily  . docusate sodium  100 mg Oral BID  . finasteride  5 mg Oral Daily  . isosorbide mononitrate  30 mg Oral Daily  . losartan  12.5 mg Oral Daily   Continuous Infusions: . dextrose 5 % and 0.45 % NaCl with KCl 20 mEq/L 75 mL/hr at 12/17/17 0424  . sodium chloride irrigation 0 mL (12/14/17 0359)   PRN Meds: diphenhydrAMINE **OR** diphenhydrAMINE, hyoscyamine, nitroGLYCERIN, ondansetron, opium-belladonna, oxyCODONE, zolpidem   Vital Signs    Vitals:   12/16/17 1506 12/16/17 1508 12/16/17 2023 12/17/17 0404  BP: (!) 132/57 (!) 132/57 121/61 134/83  Pulse: (!) 59  (!) 59 77  Resp: 17 17 14 14   Temp: 98.4 F (36.9 C) 98.3 F (36.8 C) 99.6 F (37.6 C) 98.1 F (36.7 C)  TempSrc: Oral Oral Oral Oral  SpO2: 92% 92% 96% 91%  Weight:      Height:        Intake/Output Summary (Last 24 hours) at 12/17/2017 1202 Last data filed at 12/17/2017 0930 Gross per 24 hour  Intake 8060.53 ml  Output 10665 ml  Net -2604.47 ml   Filed Weights   12/09/17 1748 12/16/17 0755  Weight: 106.5 kg 104.3 kg    Telemetry    SB/SR   Bradycardia at times (while sleeping earlier)   Longest pause 2.7 sec   SR   Occasional vent bigeminy   - Personally Reviewed  ECG    No new tracings- Personally Reviewed  Physical Exam   GEN: No acute distress.   Neck: JVP is normal  Cardiac: RRR, no murmurs, rubs, or gallops.  Respiratory: Clear to auscultation bilaterally. GI: Soft, nontender, non-distended  MS: No edema; No deformity. Neuro:  Nonfocal  Psych: Normal affect   Labs    Chemistry No results for input(s): NA, K, CL, CO2, GLUCOSE, BUN, CREATININE, CALCIUM, PROT, ALBUMIN, AST, ALT, ALKPHOS, BILITOT, GFRNONAA, GFRAA, ANIONGAP in the last 168 hours.    Hematology Recent Labs  Lab 12/14/17 0326 12/15/17 0548 12/16/17 0534  WBC 8.2 8.2 7.7  RBC 3.05* 3.01* 3.09*  HGB 9.0* 9.2* 9.3*  HCT 29.6* 29.2* 29.9*  MCV 97.0 97.0 96.8  MCH 29.5 30.6 30.1  MCHC 30.4 31.5 31.1  RDW 14.6 14.4 14.4  PLT 356 369 290    Cardiac EnzymesNo results for input(s): TROPONINI in the last 168 hours. No results for input(s): TROPIPOC in the last 168 hours.   BNPNo results for input(s): BNP, PROBNP in the last 168 hours.   DDimer No results for input(s): DDIMER in the last 168 hours.   Radiology    Dg Chest Port 1 View  Result Date: 12/16/2017 CLINICAL DATA:  Oxygen desaturation EXAM: PORTABLE CHEST 1 VIEW COMPARISON:  11/30/2017 FINDINGS: Mild cardiac enlargement. Improved aeration of the lung bases. Interval clearing of bibasilar airspace disease. No effusion. Right paratracheal soft tissue masses unchanged. Possible goiter or adenopathy. Trachea not significantly displaced. IMPRESSION: Interval clearing of bilateral airspace disease since the prior study Right paratracheal soft tissue mass unchanged. Possible goiter or adenopathy. Comparison with prior chest x-ray suggested for stability. If none are available, CT chest with contrast recommended for further evaluation. Electronically Signed   By: Darius [name] M.D.   On:  12/16/2017 08:47    Cardiac Studies   Cardiac catheterization 11/30/2017-stent to the proximal RCA. Ejection fraction on echocardiogram, 65%  Patient Profile     62 y.o. male with recently diagnosed CAD with inferior STEMI 11/30/17 s/p DES x 2 to RCA, normal LVEF, HTN, HLD. Cath showed residual 60-70% mLAD, 60-70% OM2. At time of STEMI dx was noted to have gross hematuria. CT abdomen showed bladder mass and urology planned OP management. However, he was readmitted the evening of 11/27 with gross hematuria and ABL anemia with Hgb 13.3->7.8 an required catheter to avoid urinary obstruction.He has evidence of a 4 cm mass in the right  side of his urinary bladder probably representing transitional cell carcinoma.Cardiology asked to follow in light of need for ongoing DAPT, and vague chest pressure possibly representing demand process from his anemia.  Assessment & Plan    CAD -Patient admitted with STEMI and found to have gross hematuria, bladder mass.  Had proximal RCA stenting x2  on 11/30/2017. -Has been maintained on plavix throughout procedure   Discussed with Dr Darius [name]   Re 60 mg asa   OK to add   Follow Hgb and urine   Goal for Dual antiplt rx for 12 months.   Anemia -Hemoglobin is stable at 9.3  Bladder mass -s/p removal     HL  COntinue lipitor    HTN   OK   Keep on same meds  Bradycardia   No significant pauses    Bradycardic at times while sleeping   HR higher at others    Asymptomatic   Follow as outpt   No dizziness    For questions or updates, please contact [facility] Please consult [email or web address] for contact info under    Will make sure he has f/u appt   OK to d/c from cardiac standpoint      Signed, Darius Carnes, MD  12/17/2017, 12:02 PM    Pt seen and examined  Agree with findings of Darius [name] Pt comfortable in bed     Lungs are CTA Cardiac RRR   No murmurs Abd is supple Ext without edema  Note plans for surgery tomorrow COntinue Plavix  Will follow peripherially in perioperative period    Darius [name]

## 2017-12-17 NOTE — Discharge Instructions (Signed)
Transurethral Resection of Bladder Tumor (TURBT)  °Definition:  °Transurethral Resection of the Bladder Tumor is a surgical procedure used to diagnose and remove tumors within the bladder. TURBT is the most common treatment for early stage bladder cancer.  ° °General instructions:  °Your recent bladder surgery requires very little post hospital care but some definite precautions.  °Despite the fact that no skin incisions were used, the area around the tumor removal site is raw and covered with scabs to promote healing and prevent bleeding. Certain precautions are needed to insure that the scabs are not disturbed over the next 2-4 weeks while the healing proceeds.  °Because the raw surface inside your bladder and the irritating effects of urine you may expect frequency of urination and/or urgency (a stronger desire to urinate) and perhaps even getting up at night more often. This will usually resolve or improve slowly over the healing period. You may see some blood in your urine over the first 6 weeks. Do not be alarmed, even if the urine was clear for a while. Get off your feet and drink lots of fluids until clearing occurs. If you start to pass clots or don't improve call us.  ° °Diet:  °You may return to your normal diet immediately. Because of the raw surface of your bladder, alcohol, spicy foods, foods high in acid and drinks with caffeine may cause irritation or frequency and should be used in moderation. To keep your urine flowing freely and avoid constipation, drink plenty of fluids during the day (8-10 glasses). Tip: Avoid cranberry juice because it is very acidic. °  °Activity:  °Your physical activity needs to be restricted somewhat.  We suggest that you reduce your activity under the circumstances until the bleeding has stopped. Heavy lifting (greater than 20 lbs.) and heavy exertion should be limited for 2-3 weeks. ° °Bowels:  °It is important to keep your bowels regular during the postoperative period.  Straining with bowel movements can cause bleeding. A bowel movement every other day is reasonable. Use a mild laxative if needed, such as milk of magnesia 2-3 tablespoons, or 2 Dulcolax tablets. Call if you continue to have problems. If you had been taking narcotics for pain, before, during or after your surgery, you may be constipated.  ° °Medication:  °You should resume your pre-surgery medications unless told not to. In addition you may be given an antibiotic to prevent or treat infection. Antibiotics are not always necessary. All medication should be taken as prescribed until the bottles are finished unless you are having an unusual reaction to one of the drugs.  °General Anesthetic, Adult  °A doctor specialized in giving anesthesia (anesthesiologist) or a nurse specialized in giving anesthesia (nurse anesthetist) gives medicine that makes you sleep while a procedure is performed (general anesthetic). Once the general anesthetic has been administered, you will be in a sleeplike state in which you feel no pain. After having a general anesthetic you may feel:  °Dizzy.  °Weak.  °Drowsy.  °Confused.  °These feelings are normal and can be expected to last for up to 24 hours after the procedure. ° ° °LET YOUR CAREGIVER KNOW ABOUT:  °Allergies you have.  °Medications you are taking, including herbs, eye drops, over the counter medications, dietary supplements, and creams.  °Previous problems you have had with anesthetics or numbing medicines.  °Use of cigarettes, alcohol, or illicit drugs.  °Possibility of pregnancy, if this applies.  °History of bleeding or blood disorders, including blood clots and clotting   disorders.  °Previous surgeries you have had and types of anesthetics you have received.  °Family medical history, especially anesthetic problems.  °Other health problems.  ° °AFTER THE PROCEDURE  °After surgery, you will be taken to the recovery area where a nurse will monitor your progress. You will be allowed  to go home when you are awake, stable, taking fluids well, and without serious pain or complications.  °For the first 24 hours following an anesthetic:  °Have a responsible person with you.  °Do not drive a car. If you are alone, do not take public transportation.  °Do not engage in strenuous activity. You may usually resume normal activities the next day, or as advised by your caregiver.  °Do not drink alcohol.  °Do not take medicine that has not been prescribed by your caregiver.  °Do not sign important papers or make important decisions as your judgement may be impaired.  °You may resume a normal diet as directed.  °Change bandages (dressings) as directed.  °Only take over-the-counter or prescription medicines for pain, discomfort, or fever as directed by your caregiver.  °If you have questions or problems that seem related to the anesthetic, call the hospital and ask for the anesthetist, anesthesiologist, or anesthesia department.  ° °SEEK IMMEDIATE MEDICAL CARE IF:  °You develop a rash.  °You have difficulty breathing.  °You have chest pain.  °You have allergic problems.  °You have uncontrolled nausea.  °You have uncontrolled vomiting.  °You develop any serious bleeding, especially from the incision site.  °Document Released: 04/08/2007 Document Revised: 09/11/2010 Document Reviewed: 05/02/2010  °[employer]® Patient Information ©2012 [employer].  ° ° °

## 2017-12-17 NOTE — Progress Notes (Signed)
RN received call from central telemetry monitor tech stating patient has been having more pauses today than yesterday and noted that they were longer in duration with the longest being 2.7 seconds. All pauses occurring while patient is resting in bed. Patient has no complaints at this time.  RN called Dr. Harrington Challenger who stated no changes were to be made at this time but will be coming to see the patient prior to discharge today. Will continue to monitor.

## 2017-12-23 ENCOUNTER — Inpatient Hospital Stay (INDEPENDENT_AMBULATORY_CARE_PROVIDER_SITE_OTHER): Payer: Self-pay | Admitting: Physician Assistant

## 2017-12-23 ENCOUNTER — Encounter: Payer: Self-pay | Admitting: Cardiology

## 2017-12-25 ENCOUNTER — Ambulatory Visit (INDEPENDENT_AMBULATORY_CARE_PROVIDER_SITE_OTHER): Payer: Self-pay | Admitting: Internal Medicine

## 2017-12-25 ENCOUNTER — Encounter: Payer: Self-pay | Admitting: Internal Medicine

## 2017-12-25 ENCOUNTER — Other Ambulatory Visit: Payer: Self-pay

## 2017-12-25 VITALS — BP 115/59 | HR 68 | Temp 97.3°F | Ht 69.4 in | Wt 235.3 lb

## 2017-12-25 DIAGNOSIS — Z906 Acquired absence of other parts of urinary tract: Secondary | ICD-10-CM

## 2017-12-25 DIAGNOSIS — R319 Hematuria, unspecified: Secondary | ICD-10-CM

## 2017-12-25 DIAGNOSIS — I10 Essential (primary) hypertension: Secondary | ICD-10-CM

## 2017-12-25 DIAGNOSIS — Z79899 Other long term (current) drug therapy: Secondary | ICD-10-CM

## 2017-12-25 DIAGNOSIS — I2111 ST elevation (STEMI) myocardial infarction involving right coronary artery: Secondary | ICD-10-CM

## 2017-12-25 DIAGNOSIS — Z955 Presence of coronary angioplasty implant and graft: Secondary | ICD-10-CM

## 2017-12-25 DIAGNOSIS — Z9889 Other specified postprocedural states: Secondary | ICD-10-CM

## 2017-12-25 DIAGNOSIS — F17201 Nicotine dependence, unspecified, in remission: Secondary | ICD-10-CM

## 2017-12-25 DIAGNOSIS — Z7982 Long term (current) use of aspirin: Secondary | ICD-10-CM

## 2017-12-25 DIAGNOSIS — Z7902 Long term (current) use of antithrombotics/antiplatelets: Secondary | ICD-10-CM

## 2017-12-25 DIAGNOSIS — I252 Old myocardial infarction: Secondary | ICD-10-CM

## 2017-12-25 NOTE — Patient Instructions (Addendum)
[name],  It was a pleasure meeting you today. I am glad to see you are doing well after your recent hospital admissions. Make sure to follow up with your cardiologist, if possible sooner than January 6th. If you continue having the chest discomfort try taking Nitroglycerin. You can take Nitroglycerin 0.4 mg, sublingual, every 5 min up to three times a day. If you have anymore episodes of severe chest pain I would recommend returning to the ED.

## 2017-12-25 NOTE — Assessment & Plan Note (Addendum)
He was recently hospitalized from 11/18 to 11/21 for an inferior STEMI due to 100% occlusion of the proximal RCA. He had successful therapy with proximal and distal vessel stenting and it was recommended he continue DAPT with aspirin and plavix for 12 months, along with a low dose ARB and low dose beta blocker. During his hospital stay he was found to have bradycardia in the 30's when he was sleeping so he was started on a low dose beta blocker. He quit smoking since this hospital admission. Shortly after this hospital admission he was having gross hematuria and had a TURBT on 12/5. He was instructed to stop taking ticagrelor and is now just on clopidogrel therapy. He received 2 units of PRBC's during this admission so we will recheck his CBC today to make sure his Hgb is stable.   He reported he is still having some intermittent chest discomfort with associated SOB. He said the pain is nowhere near as severe as the MI he had. He notices the discomfort come on randomly, even at rest. He has not tried to take nitroglycerin during any of these episodes. He said he usually has to reposition himself to find a comfortable position. These episodes last a couple of minutes. Denies any diaphoresis, arm or jaw pain, or n/v.   Hesitant to increase his metoprolol dose at this time due to his bradycardia while sleeping during his hospital admission. Will defer this to cardiology. Recommended he take nitroglycerin during these episodes of chest discomfort.   Plan: -follow up with Cardiology Jan 6th, patient will call office to try to move this appointment sooner -use nitroglycerin as needed for chest discomfort  -continue losartan 25 mg, metoprolol 12.5 mg, plavix 75 mg, aspirin 81 mg, Lipitor 80 mg, and Imdur 30 mg

## 2017-12-25 NOTE — Assessment & Plan Note (Signed)
Patient presented to the ED on 11/23 with gross hematuria and had a TURBT on 12/4. He was instructed to stop taking ticagrelor and is now just on clopidogrel therapy. He received 2 units of PRBC's during this admission so we will recheck his CBC today to make sure his Hgb is stable. Denies any more blood in urine. He reported some discomfort with urination but stated his urologist said to expect that for a few weeks.  Plan: -follow up CBC

## 2017-12-25 NOTE — Progress Notes (Signed)
   CC: chest discomfort, hospital follow up  HPI:  [name] is a 62 y.o. male with a PMHx of HTN, recent inferior STEMI s/p DES stent placement and recent TURBT presenting for hospital follow up.   He was recently hospitalized from 11/18 to 11/21 for an inferior STEMI due to 100% occlusion of the proximal RCA. He had successful therapy with proximal and distal vessel stenting and it was recommended he continue DAPT with aspirin and plavix for 12 months, along with a low dose ARB and low dose beta blocker. During his hospital stay he was found to have bradycardia in the 30's when he was sleeping so he was started on a low dose beta blocker. He quit smoking since this hospital admission. Shortly after this hospital admission he was having gross hematuria and had a TURBT on 12/5. He was instructed to stop taking ticagrelor and is now taking clopidogrel. He received 2 units of PRBC's during this admission so we will recheck his CBC today to make sure his Hgb is stable.   He reported he is still having some intermittent chest discomfort with associated SOB. He said the pain is nowhere near as severe as the MI he had. He notices the discomfort come on randomly, even at rest. He has not tried to take nitroglycerin during any of these episodes. He said he usually has to reposition himself to find a comfortable position. These episodes last a couple of minutes. Denies any diaphoresis, arm or jaw pain, or n/v.    Past Medical History:  Diagnosis Date  . CAD (coronary artery disease)   . Hypertension    Review of Systems:   Review of Systems  Constitutional: Negative for diaphoresis.  Respiratory: Positive for shortness of breath. Negative for cough.   Cardiovascular: Positive for chest pain. Negative for palpitations and orthopnea.  Gastrointestinal: Negative for abdominal pain, nausea and vomiting.  Genitourinary: Positive for dysuria. Negative for frequency, hematuria and urgency.     Physical Exam:  Vitals:   12/25/17 1102  BP: (!) 115/59  Pulse: 68  Temp: (!) 97.3 F (36.3 C)  TempSrc: Oral  SpO2: 100%  Weight: 235 lb 4.8 oz (106.7 kg)  Height: 5' 9.4" (1.763 m)   Physical Exam  Constitutional: He is oriented to person, place, and time and well-developed, well-nourished, and in no distress.  Cardiovascular: Normal rate, regular rhythm and normal heart sounds.  No murmur heard. Pulmonary/Chest: Effort normal and breath sounds normal. No respiratory distress. He has no wheezes. He has no rales.  Abdominal: Soft. Bowel sounds are normal.  Neurological: He is alert and oriented to person, place, and time.  Skin: Skin is warm and dry.  Psychiatric: Mood, memory, affect and judgment normal.    Assessment & Plan:   See Encounters Tab for problem based charting.  Patient seen with Dr. Angelia Mould

## 2017-12-26 LAB — CBC
HEMATOCRIT: 29 % — AB (ref 37.5–51.0)
Hemoglobin: 9.7 g/dL — ABNORMAL LOW (ref 13.0–17.7)
MCH: 30 pg (ref 26.6–33.0)
MCHC: 33.4 g/dL (ref 31.5–35.7)
MCV: 90 fL (ref 79–97)
Platelets: 350 10*3/uL (ref 150–450)
RBC: 3.23 x10E6/uL — ABNORMAL LOW (ref 4.14–5.80)
RDW: 14 % (ref 12.3–15.4)
WBC: 6.8 10*3/uL (ref 3.4–10.8)

## 2017-12-28 NOTE — Progress Notes (Signed)
Internal Medicine Clinic Attending  I saw and evaluated the patient.  I personally confirmed the key portions of the history and exam documented by Dr.  Rehman  and I reviewed pertinent patient test results.  The assessment, diagnosis, and plan were formulated together and I agree with the documentation in the resident's note.  

## 2018-01-13 ENCOUNTER — Other Ambulatory Visit: Payer: Self-pay | Admitting: Internal Medicine

## 2018-01-14 NOTE — Telephone Encounter (Signed)
Why is the script for losartan and metoprolol only given as 5 tablets?

## 2018-01-15 NOTE — Telephone Encounter (Signed)
The pharmacy stated pt only wanted 5 tablets when last time he picked them up d/t no insurance and what he could afford.

## 2018-01-18 ENCOUNTER — Ambulatory Visit (INDEPENDENT_AMBULATORY_CARE_PROVIDER_SITE_OTHER): Payer: Self-pay | Admitting: Cardiology

## 2018-01-18 ENCOUNTER — Ambulatory Visit (INDEPENDENT_AMBULATORY_CARE_PROVIDER_SITE_OTHER)
Admission: RE | Admit: 2018-01-18 | Discharge: 2018-01-18 | Disposition: A | Discharge status: Home / Self Care | Payer: Self-pay | Source: Ambulatory Visit | Attending: Cardiology | Admitting: Cardiology

## 2018-01-18 ENCOUNTER — Encounter: Payer: Self-pay | Admitting: Cardiology

## 2018-01-18 VITALS — BP 116/58 | HR 58 | Ht 69.0 in | Wt 240.8 lb

## 2018-01-18 DIAGNOSIS — I2111 ST elevation (STEMI) myocardial infarction involving right coronary artery: Secondary | ICD-10-CM

## 2018-01-18 DIAGNOSIS — R221 Localized swelling, mass and lump, neck: Secondary | ICD-10-CM

## 2018-01-18 DIAGNOSIS — D649 Anemia, unspecified: Secondary | ICD-10-CM

## 2018-01-18 DIAGNOSIS — Z95820 Peripheral vascular angioplasty status with implants and grafts: Secondary | ICD-10-CM

## 2018-01-18 DIAGNOSIS — E782 Mixed hyperlipidemia: Secondary | ICD-10-CM

## 2018-01-18 MED ORDER — ISOSORBIDE MONONITRATE ER 60 MG PO TB24: 60.0000 mg | ORAL_TABLET | Freq: Every day | ORAL | 1 refills | Status: DC

## 2018-01-18 NOTE — Patient Instructions (Signed)
Medication Instructions:  Your physician has recommended you make the following change in your medication:  1. Increase Imdur to one tablet by mouth ( 60 mg) daily sent in today to patient's requested pharmacy.    Labwork: Your physician recommends that you have lab work today: bmet/cbc   Testing/Procedures: Non-Cardiac CT scanning, (CAT scanning), is a noninvasive, special x-ray that produces cross-sectional images of the body using x-rays and a computer. CT scans help physicians diagnose and treat medical conditions. For some CT exams, a contrast material is used to enhance visibility in the area of the body being studied. CT scans provide greater clarity and reveal more details than regular x-ray exams.    Follow-Up: Your physician recommends that you keep your scheduled  follow-up appointment with Dr. Tamala Julian.    Any Other Special Instructions Will Be Listed Below (If Applicable).     If you need a refill on your cardiac medications before your next appointment, please call your pharmacy.

## 2018-01-18 NOTE — Progress Notes (Signed)
Cardiology Office Note   Date:  01/18/2018   ID:  Darius [name] [birth year], MRN [id number]  PCP:  Lars Mage, MD  Cardiologist:  Dr. Tamala Julian     Chief Complaint  Patient presents with  . Hospitalization Follow-up      History of Present Illness:  Darius [name] is a 63 y.o. male who presents for post hospitalization after admit 11/30/17 with STEMI with inf. ST elevation.  Prior to MI, had hx HTN and tobacco use..   To cath lab with 100% occl of pRCA. Stent to RCA, LAD with 60-70% stensosis.  Procedure complicated by Bezold-Jarisch response with profound hypotension and bradycardia requiring intravenous atropine and IV Levophed for heart rate and blood pressure support. Was on levophed.  Did well post procedure and did have hematuria.  Was seen by urology started on finasteride   D/c on brilinta and ASA d/c'd 12/03/17 then readmitted 12/05/17 with oliguria and hematuria.  With bladder tumor > 5cm and large amount of bleeding,  hgb down to 7.4, transfused 2 uPRBCs.  Had surgery on plavix.  His brilinta was changed to plavix.  Pt had surgery 12/16/17 with cysto, clot evc, TURBT.    With hospitalization brady at times and pause 2.7 pause.    Today pt is much better plavix continues and he is on 81 mg ASA .  No hematuria.  He has been told his cancer is gone and even if some left it is very low growing.    He has  Some DOE or just with sitting.  Brief chest pressure as well.  Different than pain with MI.  Overall it seems to be improving.  No problems swallowing food or liquids.  He has been walking and eating healthy.  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  We discussed his CAD.  On CXR he  Right paratracheal soft tissue mass unchanged. Possible goiter or adenopathy. Comparison with prior chest x-ray suggested for stability. If none are available, CT chest with contrast recommended for further evaluation  Pt without insurance. So does not believe he will do cardiac rehab  due to cost.  He has sstopped smoking 2 ppd for many years.   Past Medical History:  Diagnosis Date  . CAD (coronary artery disease)   . Hypertension     Past Surgical History:  Procedure Laterality Date  . CORONARY/GRAFT ACUTE MI REVASCULARIZATION N/A 11/30/2017   Procedure: Coronary/Graft Acute MI Revascularization;  Surgeon: Belva Crome, MD;  Location: [facility];  Service: Cardiovascular;  Laterality: N/A;  . LEFT HEART CATH AND CORONARY ANGIOGRAPHY N/A 11/30/2017   Procedure: LEFT HEART CATH AND CORONARY ANGIOGRAPHY;  Surgeon: Belva Crome, MD;  Location: [facility];  Service: Cardiovascular;  Laterality: N/A;  . TRANSURETHRAL RESECTION OF BLADDER TUMOR N/A 12/16/2017   Procedure: TRANSURETHRAL RESECTION OF BLADDER TUMOR (TURBT);  Surgeon: Franchot Gallo, MD;  Location: [facility];  Service: Urology;  Laterality: N/A;  90 MINS     Current Outpatient Medications  Medication Sig Dispense Refill  . aspirin EC 81 MG tablet Take 1 tablet (81 mg total) by mouth daily. 150 tablet 2  . atorvastatin (LIPITOR) 80 MG tablet Take 1 tablet (80 mg total) by mouth daily at 6 PM. 90 tablet 0  . clopidogrel (PLAVIX) 75 MG tablet Take 1 tablet (75 mg total) by mouth daily. 30 tablet 11  . isosorbide mononitrate (IMDUR) 30 MG 24 hr tablet Take 1  tablet (30 mg total) by mouth daily. 90 tablet 0  . losartan (COZAAR) 25 MG tablet TAKE 1/2 TABLET (12.5 MG TOTAL) BY MOUTH DAILY 5 tablet 5  . metoprolol succinate (TOPROL-XL) 25 MG 24 hr tablet TAKE 1/2 TABLET BY MOUTH DAILY 5 tablet 5  . nitroGLYCERIN (NITROSTAT) 0.4 MG SL tablet Place 0.4 mg under the tongue every 5 (five) minutes as needed for chest pain.     No current facility-administered medications for this visit.     Allergies:   Patient has no known allergies.    Social History:  The patient  reports that he quit smoking about 7 weeks ago. He has never used smokeless tobacco. He reports that he does not drink alcohol or  use drugs.   Family History:  The patient's family history is not on file.   unsure   ROS:  General:no colds or fevers, no weight changes Skin:no rashes or ulcers HEENT:no blurred vision, no congestion CV:see HPI PUL:see HPI GI:no diarrhea constipation or melena, no indigestion GU:no hematuria, no dysuria MS:no joint pain, no claudication Neuro:no syncope, no lightheadedness Endo:no diabetes, no thyroid disease  Wt Readings from Last 3 Encounters:  01/18/18 240 lb 12.8 oz (109.2 kg)  12/25/17 235 lb 4.8 oz (106.7 kg)  12/16/17 230 lb (104.3 kg)     PHYSICAL EXAM: VS:  BP (!) 116/58   Pulse (!) 58   Ht 5\' 9"  (1.753 m)   Wt 240 lb 12.8 oz (109.2 kg)   SpO2 97%   BMI 35.56 kg/m  , BMI Body mass index is 35.56 kg/m. General:Pleasant affect, NAD Skin:Warm and dry, brisk capillary refill HEENT:normocephalic, sclera clear, mucus membranes moist Neck:supple, no JVD, no bruits  Heart:S1S2 RRR without murmur, gallup, rub or click Lungs:clear without rales, rhonchi, or wheezes YIF:OYDX, non tender, + BS, do not palpate liver spleen or masses Ext:no lower ext edema, 2+ pedal pulses, 2+ radial pulses Neuro:alert and oriented, MAE, follows commands, + facial symmetry    EKG:  EKG is ordered today. The ekg ordered today demonstrates SB with inverted T waves in II and III post inf wall MI.    Recent Labs: 11/30/2017: ALT QUANTITY NOT SUFFICIENT, UNABLE TO PERFORM TEST; TSH 1.044 12/10/2017: BUN 10; Creatinine, Ser 0.80; Potassium 3.6; Sodium 140 12/25/2017: Hemoglobin 9.7; Platelets 350    Lipid Panel    Component Value Date/Time   CHOL 179 11/30/2017 0343   CHOL 179 06/25/2016 1224   TRIG 74 11/30/2017 0343   HDL 36 (L) 11/30/2017 0343   HDL 33 (L) 06/25/2016 1224   CHOLHDL 5.0 11/30/2017 0343   VLDL 15 11/30/2017 0343   LDLCALC 128 (H) 11/30/2017 0343   LDLCALC 112 (H) 06/25/2016 1224       Other studies Reviewed: Additional studies/ records that were reviewed  today include: . Echo 11/30/17 Study Conclusions  - Left ventricle: The cavity size was normal. Wall thickness was   increased in a pattern of moderate LVH. Systolic function was   normal. The estimated ejection fraction was in the range of 60%   to 65%. Inferobasal hypokinesis. The study is not technically   sufficient to allow evaluation of LV diastolic function. - Mitral valve: Mildly thickened leaflets . There was trivial   regurgitation. - Left atrium: The atrium was normal in size. - Inferior vena cava: The vessel was dilated. The respirophasic   diameter changes were blunted (< 50%), consistent with elevated   central venous pressure.  Impressions:  -  LVEF 60-65%, inferobasal hypokinesis, moderate LVH, trivial MR,   normal LA size, dilated IVC.  Cardiac cath 11/30/17  A stent was successfully placed.  A stent was successfully placed.    Inferior ST elevation myocardial infarction due to 100% occlusion of the proximal RCA.  Successful therapy with proximal and distal vessel stenting reducing 100% and 80% stenoses to 10% and 0% respectively with improvement in TIMI flow from 0 to III.  22 x 4.0 mm Onyx stents were used with the proximal stent being postdilated to 4.5 mm in diameter.  Normal left main.  LAD contains segmental 60 to 70% mid vessel narrowing.  Large normal ramus intermedius.  Circumflex gives origin to 2 small obtuse marginal branches with a second marginal containing 60 to 70% diffuse disease in the mid vessel.  Inferobasal akinesis, overall LVEF greater than 60%.  LVEDP 20.  Delayed reperfusion due to tortuous, angulated innominate/aortic arch anatomy.  Procedure complicated by Bezold-Jarisch response with profound hypotension and bradycardia requiring intravenous atropine and IV Levophed for heart rate and blood pressure support.  RECOMMENDATIONS:   IV fluid at a rate of 100 cc/h.  Wean Levophed once blood pressure is consistently above 100  mmHg.  Continue IV ReoPro for 8 hours.  Aspirin and Plavix dual antiplatelet therapy for 12 months.  High intensity statin therapy.  Institute beta-blocker therapy as tolerated by heart rate and blood pressure.  Consider angiotensin converting enzyme inhibitor or angiotensin receptor blocker for blood pressure control if required.  Patient has been on diuretic therapy as monotherapy for hypertension.  Smoking cessation.      ASSESSMENT AND PLAN:  1.  STEMI in nov 2019, with 2 stents to RCA , residual disease in LAD.  Brilinta stopped due to significant hematuria recurring 2 units PrBCs.  Now on plavix and ASA, was given 300 mg of Plavix with load.    2.   Bladder cancer with hematuria, has had surgery and resolved.   3.   HLD on statin, will need hepatic and lipid in 4 weeks.    4. HTN controlled.   5.  Blood loss anemia, check CBC today   6.  Tobacco use, has stopped  7.  Thyroid mass on CXR will check CT of neck and chest discussed with Dr. Tamala Julian.    Follow up with Dr. Laren Everts in 2 months.  Ok to return to work.    Current medicines are reviewed with the patient today.  The patient Has no concerns regarding medicines.  The following changes have been made:  See above Labs/ tests ordered today include:see above  Disposition:   FU:  see above  Signed, Cecilie Kicks, NP  01/18/2018 3:50 PM    [facility], [place], [place], [place] Phone: [phone]; Fax: [phone]

## 2018-01-19 LAB — BASIC METABOLIC PANEL
BUN / CREAT RATIO: 14 (ref 10–24)
BUN: 11 mg/dL (ref 8–27)
CO2: 21 mmol/L (ref 20–29)
Calcium: 9.7 mg/dL (ref 8.6–10.2)
Chloride: 103 mmol/L (ref 96–106)
Creatinine, Ser: 0.78 mg/dL (ref 0.76–1.27)
GFR calc Af Amer: 113 mL/min/{1.73_m2} (ref >59)
GFR calc non Af Amer: 98 mL/min/{1.73_m2} (ref >59)
Glucose: 103 mg/dL — ABNORMAL HIGH (ref 65–99)
Potassium: 4.3 mmol/L (ref 3.5–5.2)
Sodium: 141 mmol/L (ref 134–144)

## 2018-01-19 LAB — CBC WITH DIFFERENTIAL/PLATELET
Basophils Absolute: 0 10*3/uL (ref 0.0–0.2)
Basos: 0 %
EOS (ABSOLUTE): 0.2 10*3/uL (ref 0.0–0.4)
EOS: 4 %
Hematocrit: 36.3 % — ABNORMAL LOW (ref 37.5–51.0)
Hemoglobin: 12.2 g/dL — ABNORMAL LOW (ref 13.0–17.7)
Immature Grans (Abs): 0 10*3/uL (ref 0.0–0.1)
Immature Granulocytes: 0 %
Lymphocytes Absolute: 2.2 10*3/uL (ref 0.7–3.1)
Lymphs: 33 %
MCH: 30.2 pg (ref 26.6–33.0)
MCHC: 33.6 g/dL (ref 31.5–35.7)
MCV: 90 fL (ref 79–97)
Monocytes Absolute: 0.5 10*3/uL (ref 0.1–0.9)
Monocytes: 7 %
Neutrophils Absolute: 3.7 10*3/uL (ref 1.4–7.0)
Neutrophils: 56 %
Platelets: 276 10*3/uL (ref 150–450)
RBC: 4.04 x10E6/uL — ABNORMAL LOW (ref 4.14–5.80)
RDW: 13.3 % (ref 11.6–15.4)
WBC: 6.7 10*3/uL (ref 3.4–10.8)

## 2018-01-21 ENCOUNTER — Telehealth: Payer: Self-pay | Admitting: Internal Medicine

## 2018-01-21 NOTE — Telephone Encounter (Signed)
Patient found to have a "mildly enlarged thyroid glad without macroscopic nodularity or mediastinal extension" noted on CT neck done 01/18/17.  Attempted to call [name] and inform him that he should be seen at my first available appointment to pursue workup for enlarged thyroid. I was unfortunately not able to reach him. Ms. Holley Raring and I will try to reach him again to find out when he can come for appointment.   Lars Mage, MD Internal Medicine PGY2 YEMVV:[phone] 01/21/2018, 7:34 PM

## 2018-01-22 NOTE — Telephone Encounter (Signed)
Thank you for scheduling the appt.

## 2018-01-22 NOTE — Telephone Encounter (Signed)
Return call from pt - explain his doctor would like for him to come in and discuss recent test result. Appt scheduled next Friday @ 1345 PM.

## 2018-01-22 NOTE — Telephone Encounter (Signed)
Called pt - no answer; left message to call the office to schedule an appt.

## 2018-01-28 NOTE — Progress Notes (Signed)
   CC: CAD follow up  HPI:  [name] is a 63 y.o. with CAD s/p 2 stents to rca and residual lad disease, hypertension , bladder carcinoma s/p TURBT 12/16/17 who presents for follow up regarding cad. Please see problem based charting for evaluation, assessment, and plan.  Past Medical History:  Diagnosis Date  . CAD (coronary artery disease)   . Hypertension    Review of Systems:    Review of Systems  Constitutional: Negative for fever, malaise/fatigue and weight loss.  Respiratory: Negative for cough.   Cardiovascular: Negative for chest pain and palpitations.  Gastrointestinal: Negative for abdominal pain, diarrhea, nausea and vomiting.  Skin: Negative for rash.  Neurological: Negative for dizziness.  Psychiatric/Behavioral: Negative for depression.   Physical Exam:  Vitals:   01/29/18 1406  BP: (!) 137/59  Pulse: 80  Temp: 97.7 F (36.5 C)  TempSrc: Oral  SpO2: 97%  Weight: 240 lb 4.8 oz (109 kg)  Height: 5\' 9"  (1.753 m)   Physical Exam  Constitutional: Appears well-developed and well-nourished. No distress.  HENT:  Head: Normocephalic and atraumatic.  Eyes: Conjunctivae are normal.  Cardiovascular: Normal rate, regular rhythm and normal heart sounds.  Respiratory: Effort normal and breath sounds normal. No respiratory distress. No wheezes.  GI: Soft. Bowel sounds are normal. No distension. There is no tenderness.  Musculoskeletal: No edema.  Neurological: Is alert.  Skin: Not diaphoretic. No erythema.  Psychiatric: Normal mood and affect. Behavior is normal. Judgment and thought content normal.   Assessment & Plan:   See Encounters Tab for problem based charting.  Patient discussed with Dr. Lynnae January

## 2018-01-29 ENCOUNTER — Other Ambulatory Visit: Payer: Self-pay

## 2018-01-29 ENCOUNTER — Encounter: Payer: Self-pay | Admitting: Internal Medicine

## 2018-01-29 ENCOUNTER — Ambulatory Visit: Payer: Self-pay | Admitting: Internal Medicine

## 2018-01-29 VITALS — BP 137/59 | HR 80 | Temp 97.7°F | Ht 69.0 in | Wt 240.3 lb

## 2018-01-29 DIAGNOSIS — I10 Essential (primary) hypertension: Secondary | ICD-10-CM

## 2018-01-29 DIAGNOSIS — Z79899 Other long term (current) drug therapy: Secondary | ICD-10-CM

## 2018-01-29 DIAGNOSIS — Z8551 Personal history of malignant neoplasm of bladder: Secondary | ICD-10-CM

## 2018-01-29 DIAGNOSIS — Z2821 Immunization not carried out because of patient refusal: Secondary | ICD-10-CM

## 2018-01-29 DIAGNOSIS — Z955 Presence of coronary angioplasty implant and graft: Secondary | ICD-10-CM

## 2018-01-29 DIAGNOSIS — Z87891 Personal history of nicotine dependence: Secondary | ICD-10-CM

## 2018-01-29 DIAGNOSIS — C672 Malignant neoplasm of lateral wall of bladder: Secondary | ICD-10-CM

## 2018-01-29 DIAGNOSIS — I2510 Atherosclerotic heart disease of native coronary artery without angina pectoris: Secondary | ICD-10-CM

## 2018-01-29 DIAGNOSIS — M62830 Muscle spasm of back: Secondary | ICD-10-CM

## 2018-01-29 DIAGNOSIS — I2111 ST elevation (STEMI) myocardial infarction involving right coronary artery: Secondary | ICD-10-CM

## 2018-01-29 DIAGNOSIS — E049 Nontoxic goiter, unspecified: Secondary | ICD-10-CM

## 2018-01-29 DIAGNOSIS — Z9889 Other specified postprocedural states: Secondary | ICD-10-CM

## 2018-01-29 DIAGNOSIS — E079 Disorder of thyroid, unspecified: Secondary | ICD-10-CM

## 2018-01-29 DIAGNOSIS — Z7902 Long term (current) use of antithrombotics/antiplatelets: Secondary | ICD-10-CM

## 2018-01-29 DIAGNOSIS — Z0000 Encounter for general adult medical examination without abnormal findings: Secondary | ICD-10-CM

## 2018-01-29 DIAGNOSIS — D62 Acute posthemorrhagic anemia: Secondary | ICD-10-CM

## 2018-01-29 DIAGNOSIS — F17211 Nicotine dependence, cigarettes, in remission: Secondary | ICD-10-CM

## 2018-01-29 DIAGNOSIS — M62838 Other muscle spasm: Secondary | ICD-10-CM

## 2018-01-29 DIAGNOSIS — R0600 Dyspnea, unspecified: Secondary | ICD-10-CM

## 2018-01-29 DIAGNOSIS — Z7982 Long term (current) use of aspirin: Secondary | ICD-10-CM

## 2018-01-29 DIAGNOSIS — I252 Old myocardial infarction: Secondary | ICD-10-CM

## 2018-01-29 MED ORDER — DICLOFENAC SODIUM 1 % TD GEL: 2.0000 g | TRANSDERMAL | 0 refills | Status: DC | PRN

## 2018-01-29 NOTE — Patient Instructions (Signed)
It was a pleasure to see you today [name]! Please make the following changes:  Please continue to take your medications I have ordered some tests to determine if you have iron deficiency which maybe a cause of your shortness of breath If your breathing worsens please call our clinic  Please go to your financial services appointment to discuss orange card which might help you with the cost of cardiac rehab  If you have any questions or concerns, please call our clinic at [phone] between 9am-5pm and after hours call [phone] and ask for the internal medicine resident on call. If you feel you are having a medical emergency please call 911.   Thank you, we look forward to help you remain healthy!  Lars Mage, MD Internal Medicine PGY2

## 2018-01-30 DIAGNOSIS — E079 Disorder of thyroid, unspecified: Secondary | ICD-10-CM | POA: Insufficient documentation

## 2018-01-30 DIAGNOSIS — R0600 Dyspnea, unspecified: Secondary | ICD-10-CM | POA: Insufficient documentation

## 2018-01-30 DIAGNOSIS — Z87891 Personal history of nicotine dependence: Secondary | ICD-10-CM | POA: Insufficient documentation

## 2018-01-30 DIAGNOSIS — M62838 Other muscle spasm: Secondary | ICD-10-CM | POA: Insufficient documentation

## 2018-01-30 DIAGNOSIS — Z0000 Encounter for general adult medical examination without abnormal findings: Secondary | ICD-10-CM | POA: Insufficient documentation

## 2018-01-30 LAB — IRON AND TIBC
Iron Saturation: 13 % — ABNORMAL LOW (ref 15–55)
Iron: 48 ug/dL (ref 38–169)
Total Iron Binding Capacity: 359 ug/dL (ref 250–450)
UIBC: 311 ug/dL (ref 111–343)

## 2018-01-30 LAB — FERRITIN: Ferritin: 45 ng/mL (ref 30–400)

## 2018-01-30 NOTE — Assessment & Plan Note (Signed)
The patient stated that he has been having some left lower back discomfort for several months now. He does not recall any trauma to the area. Upon examination the patient right lower back musculature seems more tense in comparison to left. Appears to be a muscle spasm. Will prescribe voltaren gel.

## 2018-01-30 NOTE — Assessment & Plan Note (Signed)
The patient mentions that he has been having shortness of breath since his STEMI. Post stent placement the patient's echo showed preserved ejection fraction, ivc dilation, and inferobasal hypokinesis. IVC dilation is likely post iv fluid supplementation during hospitalization. However, patient did not appear volume overloaded during this encounter. The patient has not been going to cardiac rehab as he does not have insurance currently and is not able to afford it.   The patient has blood loss anemia secondary to gross hematuria from bladder carcinoma. His last hb=12.2 which is improved from 7.8 on 12/10/17 during which he required blood transfusion.  Assessment and plan Patient's dyspnea is likely multifactorial from cardia recovery post stent placement for inferior wall stemi and possibly also from iron deficiency anemia. Will encourage the patient to pursue cardiac rehab once he has financial means. [name] mentioned that he anticipates medicaid support in the next few weeks. I encouraged him to speak with our financial aid advisor as well. Will also check ferritin and tibc to evaluate for iron deficiency anemia.

## 2018-01-30 NOTE — Assessment & Plan Note (Signed)
Patient was found to have a right paratracheal soft tissue mass suggestive of a goiter or adenopathy on cxr done 12/16/17. CT soft tissue neck done 01/18/18 found mildly enlarged thyroid glad without nodularity or mediastinal extension. Patient's TSH=1.044 in Nov 2019 while T3 and T4 have not been done.   Assessment and plan  The patient does not have any signs or symptoms of thyroid abnormalities. Due to tsh being normal and no nodules appreciated on CT it is unlikely that the patient has a significant thyroid abnormality at this time. Will monitor.

## 2018-01-30 NOTE — Assessment & Plan Note (Signed)
The patient's blood pressure during this visit was 137/59. The patient is currently taking metoprolol 12.5mg , losartan 12.5mg , and imdur 60mg  qd. His last blood pressure visits are   BP Readings from Last 3 Encounters:  01/29/18 (!) 137/59  01/18/18 (!) 116/58  12/25/17 (!) 115/59   The patient does not report palpitations, dizziness, or chest pain. He does have some sob.  Assessment and Plan Patient's blood pressure is well controlled. Recommend continuing metoprolol 12.5mg , losartan 12.5mg , and imdur 60mg  qd.

## 2018-01-30 NOTE — Assessment & Plan Note (Signed)
Patient suffered an inferior wall stemi and received stent to prox and mid rca due to 100% and 80% stenosis respectively. The patient was seen by cardiology on Jan 6th who recommended continuing plavix and aspirin and to follow up with cardiology in 2 months.   Assessment and plan  The patient is doing well post stent placement. He continues to have 65% residual stenosis in prox lad which may need to be addressed at a later time if patient becomes symptomatic.   -Continue metoprolol succinate 12.5mg  qd, losartan 12.5mg  qd, and imdur 30mg  qd -Continue aspirin and plavix  -Follow up with cardiology in 2 months (March 2020) -Lab only visit for CMP and lipid panel in 2 weeks

## 2018-01-30 NOTE — Assessment & Plan Note (Signed)
Patient had a 40 ppy smoking history, but he has quit since his last hospitalization. He continues to have exposure to secondhand smoke through his wife however.

## 2018-01-30 NOTE — Assessment & Plan Note (Signed)
Discovered due to development of gross hematuria after starting antiplatelet therapy. CT scan done 12/03/17 showed 3.6x3.8x4.0cm mass on the right side of urinary bladder adjacent to right uretrovesicular junction. Patient was admitted 11/30/17-12/03/17 for peristent bleeding. He was transfused 2 u prbc, brillinta was changed to plavix, and underwent turbt on 12/17/17.  Assessment and plan  The patient is stable post turbt.   -Will request urology records.  -Patient will follow up with urology on February 18th

## 2018-01-30 NOTE — Assessment & Plan Note (Signed)
Patient refused influenza vaccination  Referred patient for colonoscopy  Patient stated that he will check his records to see if he has had a TDAP vaccine in past 10 years.

## 2018-02-02 NOTE — Progress Notes (Signed)
Internal Medicine Clinic Attending  Case discussed with Dr. Chundi at the time of the visit.  We reviewed the resident's history and exam and pertinent patient test results.  I agree with the assessment, diagnosis, and plan of care documented in the resident's note. 

## 2018-02-12 ENCOUNTER — Other Ambulatory Visit (INDEPENDENT_AMBULATORY_CARE_PROVIDER_SITE_OTHER): Payer: Self-pay

## 2018-02-12 DIAGNOSIS — I10 Essential (primary) hypertension: Secondary | ICD-10-CM

## 2018-02-13 LAB — CMP14 + ANION GAP
ALBUMIN: 4.3 g/dL (ref 3.8–4.9)
ALT: 22 IU/L (ref 0–44)
AST: 22 IU/L (ref 0–40)
Albumin/Globulin Ratio: 1.5 (ref 1.2–2.2)
Alkaline Phosphatase: 84 IU/L (ref 39–117)
Anion Gap: 19 mmol/L — ABNORMAL HIGH (ref 10.0–18.0)
BUN / CREAT RATIO: 14 (ref 10–24)
BUN: 11 mg/dL (ref 8–27)
Bilirubin Total: 0.6 mg/dL (ref 0.0–1.2)
CO2: 21 mmol/L (ref 20–29)
Calcium: 9.9 mg/dL (ref 8.6–10.2)
Chloride: 102 mmol/L (ref 96–106)
Creatinine, Ser: 0.77 mg/dL (ref 0.76–1.27)
GFR calc Af Amer: 114 mL/min/{1.73_m2} (ref >59)
GFR calc non Af Amer: 99 mL/min/{1.73_m2} (ref >59)
GLOBULIN, TOTAL: 2.9 g/dL (ref 1.5–4.5)
Glucose: 134 mg/dL — ABNORMAL HIGH (ref 65–99)
Potassium: 4.4 mmol/L (ref 3.5–5.2)
SODIUM: 142 mmol/L (ref 134–144)
TOTAL PROTEIN: 7.2 g/dL (ref 6.0–8.5)

## 2018-02-13 LAB — LIPID PANEL
Chol/HDL Ratio: 3 ratio (ref 0.0–5.0)
Cholesterol, Total: 93 mg/dL — ABNORMAL LOW (ref 100–199)
HDL: 31 mg/dL — ABNORMAL LOW (ref >39)
LDL Calculated: 45 mg/dL (ref 0–99)
Triglycerides: 87 mg/dL (ref 0–149)
VLDL Cholesterol Cal: 17 mg/dL (ref 5–40)

## 2018-02-18 ENCOUNTER — Other Ambulatory Visit: Payer: Self-pay | Admitting: Internal Medicine

## 2018-03-22 ENCOUNTER — Other Ambulatory Visit: Payer: Self-pay | Admitting: Internal Medicine

## 2018-04-09 ENCOUNTER — Ambulatory Visit (INDEPENDENT_AMBULATORY_CARE_PROVIDER_SITE_OTHER): Payer: Self-pay | Admitting: Internal Medicine

## 2018-04-09 ENCOUNTER — Other Ambulatory Visit: Payer: Self-pay

## 2018-04-09 DIAGNOSIS — Z0000 Encounter for general adult medical examination without abnormal findings: Secondary | ICD-10-CM

## 2018-04-09 DIAGNOSIS — I25810 Atherosclerosis of coronary artery bypass graft(s) without angina pectoris: Secondary | ICD-10-CM

## 2018-04-09 NOTE — Progress Notes (Signed)
   CC: CAD follow up  HPI:  Mr.Densel El [name] is a 63 y.o. with CAD, HTN, and STEMI who is presenting for follow up of CAD. Please see problem based charting for evaluation, assessment, and plan.  This is a telephone encounter between [place] and Kashten Gowin on 04/09/2018 for CAD follow up. The visit was conducted with the patient located at home and Miryam Mcelhinney at [facility]. The patient's identity was confirmed using their DOB and current address. The patient has consented to being evaluated through a telephone encounter and understands the associated risks (an examination cannot be done and the patient may need to come in for an appointment) / benefits (allows the patient to remain at home, decreasing exposure to coronavirus). I personally spent 10 minutes on medical discussion.    Past Medical History:  Diagnosis Date  . CAD (coronary artery disease)   . Hypertension    Review of Systems:    Review of Systems  Constitutional: Negative for chills and fever.  Respiratory: Positive for shortness of breath. Negative for cough.   Cardiovascular: Negative for chest pain.  Gastrointestinal: Negative for nausea and vomiting.  Neurological: Negative for dizziness.  Psychiatric/Behavioral: Insomnia:       Physical Exam:  There were no vitals filed for this visit. Telephone encounter  Assessment & Plan:   See Encounters Tab for problem based charting.  Patient discussed with Dr. Beryle Beams

## 2018-04-09 NOTE — Progress Notes (Signed)
Medicine attending: Medical history, presenting problems, and medications, reviewed with resident physician Dr Lars Mage on the day of the patient telephone communication and I concur with her evaluation and management plan.

## 2018-04-09 NOTE — Assessment & Plan Note (Signed)
Patient states that he is doing well without any chest pain, dizziness.  He states that he has occasional shortness of breath since his catheterization.  Patient did not undergo cardiac rehab and I anticipate that he will have a slow recovery.  The patient has been adherent to his medications of atorvastatin 80 mg daily, losartan 12.5 mg daily, metoprolol 12.5 mg daily, and Imdur 60 mg daily.   -Encourage patient to remain adherent to his medication regimen -Patient is to follow-up with cardiology in April 2020

## 2018-04-09 NOTE — Assessment & Plan Note (Signed)
Patient is self-pay currently and needs to get orange card for financial assistance.  The patient states that he will call back the office to help set this up.

## 2018-05-05 ENCOUNTER — Telehealth: Payer: Self-pay | Admitting: Interventional Cardiology

## 2018-05-05 NOTE — Telephone Encounter (Signed)
Patient set up for MyChart?  Yes sending my chart consent- also receive verbal consent   Is patient using Smartphone/computer/tablet? smartphone  Did audio/video work?  Does patient need telephone visit?no  Best phone number to use? [phone]  Special Instructions? Pt will have bp, wt, and medication list ready for appt     Virtual Visit Pre-Appointment Phone Call  "(Name), I am calling you today to discuss your upcoming appointment. We are currently trying to limit exposure to the virus that causes COVID-19 by seeing patients at home rather than in the office."  1. "What is the BEST phone number to call the day of the visit?" - include this in appointment notes  2. Do you have or have access to (through a family member/friend) a smartphone with video capability that we can use for your visit?" a. If yes - list this number in appt notes as cell (if different from BEST phone #) and list the appointment type as a VIDEO visit in appointment notes b. If no - list the appointment type as a PHONE visit in appointment notes  3. Confirm consent - "In the setting of the current Covid19 crisis, you are scheduled for a (phone or video) visit with your provider on (date) at (time).  Just as we do with many in-office visits, in order for you to participate in this visit, we must obtain consent.  If you'd like, I can send this to your mychart (if signed up) or email for you to review.  Otherwise, I can obtain your verbal consent now.  All virtual visits are billed to your insurance company just like a normal visit would be.  By agreeing to a virtual visit, we'd like you to understand that the technology does not allow for your provider to perform an examination, and thus may limit your provider's ability to fully assess your condition. If your provider identifies any concerns that need to be evaluated in person, we will make arrangements to do so.  Finally, though the technology is pretty good, we  cannot assure that it will always work on either your or our end, and in the setting of a video visit, we may have to convert it to a phone-only visit.  In either situation, we cannot ensure that we have a secure connection.  Are you willing to proceed?" STAFF: Did the patient verbally acknowledge consent to telehealth visit? Document YES/NO here: yes  4. Advise patient to be prepared - "Two hours prior to your appointment, go ahead and check your blood pressure, pulse, oxygen saturation, and your weight (if you have the equipment to check those) and write them all down. When your visit starts, your provider will ask you for this information. If you have an Apple Watch or Kardia device, please plan to have heart rate information ready on the day of your appointment. Please have a pen and paper handy nearby the day of the visit as well."  5. Give patient instructions for MyChart download to smartphone OR Doximity/Doxy.me as below if video visit (depending on what platform provider is using)  6. Inform patient they will receive a phone call 15 minutes prior to their appointment time (may be from unknown caller ID) so they should be prepared to answer    [place] has been deemed a candidate for a follow-up tele-health visit to limit community exposure during the Covid-19 pandemic. I spoke with the patient via phone to ensure availability of phone/video  source, confirm preferred email & phone number, and discuss instructions and expectations.  I reminded Darius [name] to be prepared with any vital sign and/or heart rhythm information that could potentially be obtained via home monitoring, at the time of his visit. I reminded Darius [name] to expect a phone call prior to his visit.  Darius [name] 05/05/2018 11:54 AM   INSTRUCTIONS FOR DOWNLOADING THE MYCHART APP TO SMARTPHONE  - The patient must first make sure to have activated MyChart and know their login  information - If Apple, go to [employer] and type in MyChart in the search bar and download the app. If Android, ask patient to go to [employer] and type in [place] in the search bar and download the app. The app is free but as with any other app downloads, their phone may require them to verify saved payment information or Apple/Android password.  - The patient will need to then log into the app with their MyChart username and password, and select [facility] as their healthcare provider to link the account. When it is time for your visit, go to the MyChart app, find appointments, and click Begin Video Visit. Be sure to Select Allow for your device to access the Microphone and Camera for your visit. You will then be connected, and your provider will be with you shortly.  **If they have any issues connecting, or need assistance please contact MyChart service desk [phone])**  **If using a computer, in order to ensure the best quality for their visit they will need to use either of the following Internet Browsers: [employer], or Google Chrome**  IF USING DOXIMITY or DOXY.ME - The patient will receive a link just prior to their visit by text.     FULL LENGTH CONSENT FOR TELE-HEALTH VISIT   I hereby voluntarily request, consent and authorize [facility] and its employed or contracted physicians, physician assistants, nurse practitioners or other licensed health care professionals (the Practitioner), to provide me with telemedicine health care services (the Services") as deemed necessary by the treating Practitioner. I acknowledge and consent to receive the Services by the Practitioner via telemedicine. I understand that the telemedicine visit will involve communicating with the Practitioner through live audiovisual communication technology and the disclosure of certain medical information by electronic transmission. I acknowledge that I have been given the opportunity to  request an in-person assessment or other available alternative prior to the telemedicine visit and am voluntarily participating in the telemedicine visit.  I understand that I have the right to withhold or withdraw my consent to the use of telemedicine in the course of my care at any time, without affecting my right to future care or treatment, and that the Practitioner or I may terminate the telemedicine visit at any time. I understand that I have the right to inspect all information obtained and/or recorded in the course of the telemedicine visit and may receive copies of available information for a reasonable fee.  I understand that some of the potential risks of receiving the Services via telemedicine include:   Delay or interruption in medical evaluation due to technological equipment failure or disruption;  Information transmitted may not be sufficient (e.g. poor resolution of images) to allow for appropriate medical decision making by the Practitioner; and/or   In rare instances, security protocols could fail, causing a breach of personal health information.  Furthermore, I acknowledge that it is my responsibility to provide information about my medical  history, conditions and care that is complete and accurate to the best of my ability. I acknowledge that Practitioner's advice, recommendations, and/or decision may be based on factors not within their control, such as incomplete or inaccurate data provided by me or distortions of diagnostic images or specimens that may result from electronic transmissions. I understand that the practice of medicine is not an exact science and that Practitioner makes no warranties or guarantees regarding treatment outcomes. I acknowledge that I will receive a copy of this consent concurrently upon execution via email to the email address I last provided but may also request a printed copy by calling the office of [facility].    I understand that my insurance  will be billed for this visit.   I have read or had this consent read to me.  I understand the contents of this consent, which adequately explains the benefits and risks of the Services being provided via telemedicine.   I have been provided ample opportunity to ask questions regarding this consent and the Services and have had my questions answered to my satisfaction.  I give my informed consent for the services to be provided through the use of telemedicine in my medical care  By participating in this telemedicine visit I agree to the above.

## 2018-05-06 DIAGNOSIS — I2510 Atherosclerotic heart disease of native coronary artery without angina pectoris: Secondary | ICD-10-CM | POA: Insufficient documentation

## 2018-05-06 NOTE — Progress Notes (Signed)
Virtual Visit via Video Note   This visit type was conducted due to national recommendations for restrictions regarding the COVID-19 Pandemic (e.g. social distancing) in an effort to limit this patient's exposure and mitigate transmission in our community.  Due to his co-morbid illnesses, this patient is at least at moderate risk for complications without adequate follow up.  This format is felt to be most appropriate for this patient at this time.  All issues noted in this document were discussed and addressed.  A limited physical exam was performed with this format.  Please refer to the patient's chart for his consent to telehealth for [facility].   Evaluation Performed:  Follow-up visit  Date:  05/07/2018   ID:  Darius, [name] [birth year], MRN [id number]  Patient Location: Home Provider Location: Office  PCP:  Lars Mage, MD  Cardiologist:  Sinclair Grooms, MD  Electrophysiologist:  None   Chief Complaint:  CAD/MI  History of Present Illness:    Darius [name] is a 63 y.o. male with h/o inferior STEMI treated with RCA DES and has residual LAD 70% stenosis. Urologic bleeding on asa/brilinta -->diagnosis of bladder cancer which was resected in 12/2017. Now on asa /clopidogrl and stable hgb. Other history positive for Behcet's disease,  tobacco use, hyperlipidemia, and hypertension.  The patient is doing well.  He denies angina.  He has had no recurrent bleeding after discontinuation of Brilinta, resection of a bladder tumor, and starting Plavix.  He is relatively sedentary.  He is getting nowhere close to 150 minutes of moderate activity.  He is concerned about slow heart rate.  Overall, he feels great and is very appreciative of the care that he received during his heart attack and management of hematuria/bladder cancer.  He has no specific medication complaints.  The patient does not have symptoms concerning for COVID-19 infection (fever, chills, cough, or new  shortness of breath).    Past Medical History:  Diagnosis Date  . CAD (coronary artery disease)   . Hypertension    Past Surgical History:  Procedure Laterality Date  . CORONARY/GRAFT ACUTE MI REVASCULARIZATION N/A 11/30/2017   Procedure: Coronary/Graft Acute MI Revascularization;  Surgeon: Belva Crome, MD;  Location: [place] CV [facility];  Service: Cardiovascular;  Laterality: N/A;  . LEFT HEART CATH AND CORONARY ANGIOGRAPHY N/A 11/30/2017   Procedure: LEFT HEART CATH AND CORONARY ANGIOGRAPHY;  Surgeon: Belva Crome, MD;  Location: [place] CV LAB;  Service: Cardiovascular;  Laterality: N/A;  . TRANSURETHRAL RESECTION OF BLADDER TUMOR N/A 12/16/2017   Procedure: TRANSURETHRAL RESECTION OF BLADDER TUMOR (TURBT);  Surgeon: Franchot Gallo, MD;  Location: [facility];  Service: Urology;  Laterality: N/A;  90 MINS     Current Meds  Medication Sig  . aspirin EC 81 MG tablet Take 1 tablet (81 mg total) by mouth daily.  Marland Kitchen atorvastatin (LIPITOR) 80 MG tablet TAKE 1 TABLET BY MOUTH DAILY AT 6PM  . clopidogrel (PLAVIX) 75 MG tablet Take 1 tablet (75 mg total) by mouth daily.  . diclofenac sodium (VOLTAREN) 1 % GEL Apply 2 g topically as needed.  . isosorbide mononitrate (IMDUR) 60 MG 24 hr tablet Take 1 tablet (60 mg total) by mouth daily.  Marland Kitchen losartan (COZAAR) 25 MG tablet TAKE 1/2 TABLET BY MOUTH EVERY DAY  . metoprolol succinate (TOPROL-XL) 25 MG 24 hr tablet TAKE 1/2 TABLET BY MOUTH EVERY DAY  . nitroGLYCERIN (NITROSTAT) 0.4 MG SL tablet Place 0.4 mg under the tongue  every 5 (five) minutes as needed for chest pain.     Allergies:   Patient has no known allergies.   Social History   Tobacco Use  . Smoking status: Former Smoker    Last attempt to quit: 11/30/2017    Years since quitting: 0.4  . Smokeless tobacco: Never Used  . Tobacco comment: quit x 41months.  Substance Use Topics  . Alcohol use: No  . Drug use: No     Family Hx: The patient's family history is not on file.   ROS:   Please see the history of present illness.    Has arthritis in his knees.  This dissuaded him from activity. All other systems reviewed and are negative.   Prior CV studies:   The following studies were reviewed today: CATH PCI 11/2017: Diagnostic  Dominance: Right    Intervention       Labs/Other Tests and Data Reviewed:    EKG:  No ECG reviewed.  Recent Labs: 11/30/2017: TSH 1.044 01/18/2018: Hemoglobin 12.2; Platelets 276 02/12/2018: ALT 22; BUN 11; Creatinine, Ser 0.77; Potassium 4.4; Sodium 142   Recent Lipid Panel Lab Results  Component Value Date/Time   CHOL 93 (L) 02/12/2018 11:56 AM   TRIG 87 02/12/2018 11:56 AM   HDL 31 (L) 02/12/2018 11:56 AM   CHOLHDL 3.0 02/12/2018 11:56 AM   CHOLHDL 5.0 11/30/2017 03:43 AM   LDLCALC 45 02/12/2018 11:56 AM    Wt Readings from Last 3 Encounters:  05/07/18 248 lb (112.5 kg)  01/29/18 240 lb 4.8 oz (109 kg)  01/18/18 240 lb 12.8 oz (109.2 kg)     Objective:    Vital Signs:  BP 121/67   Pulse (!) 42   Ht 5\' 9"  (1.753 m)   Wt 248 lb (112.5 kg)   BMI 36.62 kg/m    VITAL SIGNS:  reviewed GEN:  no acute distress RESPIRATORY:  normal respiratory effort, symmetric expansion NEURO:  alert and oriented x 3, no obvious focal deficit  ASSESSMENT & PLAN:    1. Coronary artery disease involving native coronary artery of native heart with other form of angina pectoris ([place])   2. Mixed hyperlipidemia   3. Behcet's disease ([place])   4. History of tobacco use   5. Essential hypertension   6. 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 2019 novel coronavirus disease (COVID-19)    PLAN:  1. The patient is not having angina pectoris.  Secondary risk prevention is discussed.  He needs to increase his moderate aerobic activity.  Very sedentary at this time.  He has not resumed smoking. 2. LDL target is less than 70. 3. Not addressed 4. Continues to abstain from cigarettes 5. Target blood pressure 130/80.   Heart rate is relatively low.  Will discontinue metoprolol succinate. 6. Not addressed  Overall education and awareness concerning primary/secondary risk prevention was discussed in detail: LDL less than 70, hemoglobin A1c less than 7, blood pressure target less than 130/80 mmHg, >150 minutes of moderate aerobic activity per week, avoidance of smoking, weight control (via diet and exercise), and continued surveillance/management of/for obstructive sleep apnea.   COVID-19 Education: The signs and symptoms of COVID-19 were discussed with the patient and how to seek care for testing (follow up with PCP or arrange E-visit).  The importance of social distancing was discussed today.  Time:   Today, I have spent 15 minutes with the patient with telehealth technology discussing the above problems.  Medication Adjustments/Labs and Tests Ordered: Current medicines are reviewed at length with the patient today.  Concerns regarding medicines are outlined above.   Tests Ordered: No orders of the defined types were placed in this encounter.   Medication Changes: No orders of the defined types were placed in this encounter.   Disposition:  Follow up in 6 month(s)  Signed, Sinclair Grooms, MD  05/07/2018 2:57 PM    [facility]

## 2018-05-07 ENCOUNTER — Encounter: Payer: Self-pay | Admitting: Interventional Cardiology

## 2018-05-07 ENCOUNTER — Other Ambulatory Visit: Payer: Self-pay

## 2018-05-07 ENCOUNTER — Telehealth (INDEPENDENT_AMBULATORY_CARE_PROVIDER_SITE_OTHER): Payer: Self-pay | Admitting: Interventional Cardiology

## 2018-05-07 VITALS — BP 121/67 | HR 42 | Ht 69.0 in | Wt 248.0 lb

## 2018-05-07 DIAGNOSIS — M352 Behcet's disease: Secondary | ICD-10-CM

## 2018-05-07 DIAGNOSIS — C672 Malignant neoplasm of lateral wall of bladder: Secondary | ICD-10-CM

## 2018-05-07 DIAGNOSIS — E782 Mixed hyperlipidemia: Secondary | ICD-10-CM

## 2018-05-07 DIAGNOSIS — Z87891 Personal history of nicotine dependence: Secondary | ICD-10-CM

## 2018-05-07 DIAGNOSIS — I25118 Atherosclerotic heart disease of native coronary artery with other forms of angina pectoris: Secondary | ICD-10-CM

## 2018-05-07 DIAGNOSIS — I10 Essential (primary) hypertension: Secondary | ICD-10-CM

## 2018-05-07 NOTE — Patient Instructions (Signed)
Medication Instructions:  1) DISCONTINUE Metoprolol  If you need a refill on your cardiac medications before your next appointment, please call your pharmacy.   Lab work: None If you have labs (blood work) drawn today and your tests are completely normal, you will receive your results only by: Marland Kitchen MyChart Message (if you have MyChart) OR . A paper copy in the mail If you have any lab test that is abnormal or we need to change your treatment, we will call you to review the results.  Testing/Procedures: None  Follow-Up: At [facility], you and your health needs are our priority.  As part of our continuing mission to provide you with exceptional heart care, we have created designated Provider Care Teams.  These Care Teams include your primary Cardiologist (physician) and Advanced Practice Providers (APPs -  Physician Assistants and Nurse Practitioners) who all work together to provide you with the care you need, when you need it. You will need a follow up appointment in 6 months.  Please call our office 2 months in advance to schedule this appointment.  You may see Sinclair Grooms, MD or one of the following Advanced Practice Providers on your designated Care Team:   Truitt Merle, NP Cecilie Kicks, NP . Kathyrn Drown, NP  Any Other Special Instructions Will Be Listed Below (If Applicable).

## 2018-07-18 ENCOUNTER — Other Ambulatory Visit: Payer: Self-pay | Admitting: Cardiology

## 2018-09-22 ENCOUNTER — Other Ambulatory Visit: Payer: Self-pay | Admitting: Internal Medicine

## 2018-09-29 ENCOUNTER — Emergency Department (HOSPITAL_COMMUNITY): Payer: Self-pay

## 2018-09-29 ENCOUNTER — Emergency Department (HOSPITAL_COMMUNITY)
Admission: EM | Admit: 2018-09-29 | Discharge: 2018-09-29 | Disposition: A | Discharge status: Home / Self Care | Payer: Self-pay | Attending: Emergency Medicine | Admitting: Emergency Medicine

## 2018-09-29 ENCOUNTER — Other Ambulatory Visit: Payer: Self-pay

## 2018-09-29 ENCOUNTER — Telehealth: Payer: Self-pay | Admitting: *Deleted

## 2018-09-29 DIAGNOSIS — H81399 Other peripheral vertigo, unspecified ear: Secondary | ICD-10-CM | POA: Insufficient documentation

## 2018-09-29 DIAGNOSIS — I2510 Atherosclerotic heart disease of native coronary artery without angina pectoris: Secondary | ICD-10-CM | POA: Insufficient documentation

## 2018-09-29 DIAGNOSIS — Z7982 Long term (current) use of aspirin: Secondary | ICD-10-CM | POA: Insufficient documentation

## 2018-09-29 DIAGNOSIS — Z87891 Personal history of nicotine dependence: Secondary | ICD-10-CM | POA: Insufficient documentation

## 2018-09-29 DIAGNOSIS — Z79899 Other long term (current) drug therapy: Secondary | ICD-10-CM | POA: Insufficient documentation

## 2018-09-29 DIAGNOSIS — I10 Essential (primary) hypertension: Secondary | ICD-10-CM | POA: Insufficient documentation

## 2018-09-29 DIAGNOSIS — R112 Nausea with vomiting, unspecified: Secondary | ICD-10-CM | POA: Insufficient documentation

## 2018-09-29 DIAGNOSIS — R001 Bradycardia, unspecified: Secondary | ICD-10-CM

## 2018-09-29 LAB — COMPREHENSIVE METABOLIC PANEL
ALT: 22 U/L (ref 0–44)
AST: 20 U/L (ref 15–41)
Albumin: 4.1 g/dL (ref 3.5–5.0)
Alkaline Phosphatase: 84 U/L (ref 38–126)
Anion gap: 11 (ref 5–15)
BUN: 13 mg/dL (ref 6–20)
CO2: 24 mmol/L (ref 22–32)
Calcium: 9.9 mg/dL (ref 8.9–10.3)
Chloride: 104 mmol/L (ref 98–111)
Creatinine, Ser: 0.83 mg/dL (ref 0.61–1.24)
GFR calc Af Amer: >60 mL/min (ref >60)
GFR calc non Af Amer: >60 mL/min (ref >60)
Glucose, Bld: 171 mg/dL — ABNORMAL HIGH (ref 70–99)
Potassium: 4.4 mmol/L (ref 3.5–5.1)
Sodium: 139 mmol/L (ref 135–145)
Total Bilirubin: 1.1 mg/dL (ref 0.3–1.2)
Total Protein: 8 g/dL (ref 6.5–8.1)

## 2018-09-29 LAB — CBC
HCT: 48.9 % (ref 39.0–52.0)
Hemoglobin: 16.7 g/dL (ref 13.0–17.0)
MCH: 31.3 pg (ref 26.0–34.0)
MCHC: 34.2 g/dL (ref 30.0–36.0)
MCV: 91.6 fL (ref 80.0–100.0)
Platelets: 284 10*3/uL (ref 150–400)
RBC: 5.34 MIL/uL (ref 4.22–5.81)
RDW: 13.9 % (ref 11.5–15.5)
WBC: 13.8 10*3/uL — ABNORMAL HIGH (ref 4.0–10.5)
nRBC: 0 % (ref 0.0–0.2)

## 2018-09-29 LAB — LIPASE, BLOOD: Lipase: 22 U/L (ref 11–51)

## 2018-09-29 MED ORDER — MECLIZINE HCL 25 MG PO TABS
25.0000 mg | ORAL_TABLET | Freq: Once | ORAL | Status: AC
  Administered 2018-09-29: 25 mg via ORAL
  Filled 2018-09-29: qty 1

## 2018-09-29 MED ORDER — MECLIZINE HCL 25 MG PO TABS: 25.0000 mg | ORAL_TABLET | Freq: Three times a day (TID) | ORAL | 0 refills | Status: DC | PRN

## 2018-09-29 MED ORDER — SODIUM CHLORIDE 0.9% FLUSH: 3.0000 mL | Freq: Once | INTRAVENOUS | Status: DC

## 2018-09-29 NOTE — ED Notes (Signed)
Discharge instructions discussed with pt. Pt verbalized understanding with no questions at this time. Family to pick up pt.

## 2018-09-29 NOTE — ED Triage Notes (Signed)
Pt presents with abd discomfort, vomiting, dizziness and SOB starting at 0200 this am. Vomited x6, after moving his head around he would get dizzy and vomit

## 2018-09-29 NOTE — Telephone Encounter (Signed)
Thank you. agree 

## 2018-09-29 NOTE — ED Notes (Signed)
Pt ambulated well with very little labored breathing with SO2 dropping to 92

## 2018-09-29 NOTE — ED Notes (Signed)
Pt has urinal at bedside. Pt aware that urine sample is needed.

## 2018-09-29 NOTE — Telephone Encounter (Signed)
Pt calls and states he is having N&V, weakness, dizziness, h/a, shortness of breath. Chest discomfort, he took some nitro this am and it helped a little, he is advised to call 911 or go to ED and ask to be seen asap due to symptoms and cardiac hx. He is agreeable

## 2018-09-29 NOTE — ED Provider Notes (Signed)
[place] EMERGENCY DEPARTMENT Provider Note   CSN: [id number] Arrival date & time: 09/29/18  1149     History   Chief Complaint Chief Complaint  Patient presents with  . Abdominal Pain    HPI Darius [name] is a 63 y.o. male.     HPI Patient states that around 2 AM today he developed dizziness which he describes as a spinning sensation and feeling off balance.  Was associated with nausea and vomiting x6.  Worse with movement of his head.  States has had similar symptoms in the past but have been less severe.  Denies any focal weakness or numbness.  No headache or visual changes.  Denies any abdominal pain.  Patient denies chest pain.  States he has intermittent mild shortness of breath which is more chronic in nature.  No hearing changes or tinnitus.  No fever or chills. Past Medical History:  Diagnosis Date  . CAD (coronary artery disease)   . Hypertension     Patient Active Problem List   Diagnosis Date Noted  . CAD (coronary artery disease), native coronary artery 05/06/2018  . Dyspnea 01/30/2018  . History of tobacco use 01/30/2018  . Thyroid mass 01/30/2018  . Muscle spasm 01/30/2018  . Healthcare maintenance 01/30/2018  . 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 STEMI (ST elevation myocardial infarction) ([place]) 11/30/2017  . Behcet's disease ([place]) 08/21/2015  . HTN (hypertension) 08/06/2015    Past Surgical History:  Procedure Laterality Date  . CORONARY/GRAFT ACUTE MI REVASCULARIZATION N/A 11/30/2017   Procedure: Coronary/Graft Acute MI Revascularization;  Surgeon: Belva Crome, MD;  Location: [place] CV LAB;  Service: Cardiovascular;  Laterality: N/A;  . LEFT HEART CATH AND CORONARY ANGIOGRAPHY N/A 11/30/2017   Procedure: LEFT HEART CATH AND CORONARY ANGIOGRAPHY;  Surgeon: Belva Crome, MD;  Location: [place] CV LAB;  Service: Cardiovascular;  Laterality: N/A;  . TRANSURETHRAL RESECTION OF BLADDER TUMOR  N/A 12/16/2017   Procedure: TRANSURETHRAL RESECTION OF BLADDER TUMOR (TURBT);  Surgeon: Franchot Gallo, MD;  Location: [facility];  Service: Urology;  Laterality: N/A;  90 MINS        Home Medications    Prior to Admission medications   Medication Sig Start Date End Date Taking? Authorizing Provider  aspirin EC 81 MG tablet Take 1 tablet (81 mg total) by mouth daily. 12/17/17 12/17/18  Franchot Gallo, MD  atorvastatin (LIPITOR) 80 MG tablet TAKE 1 TABLET BY MOUTH DAILY AT [facility] 09/23/18   Chundi, Verne Spurr, MD  clopidogrel (PLAVIX) 75 MG tablet Take 1 tablet (75 mg total) by mouth daily. 12/17/17   Franchot Gallo, MD  diclofenac sodium (VOLTAREN) 1 % GEL Apply 2 g topically as needed. 01/29/18   Chundi, Verne Spurr, MD  isosorbide mononitrate (IMDUR) 60 MG 24 hr tablet TAKE 1 TABLET BY MOUTH EVERY DAY 07/19/18   Isaiah Serge, NP  losartan (COZAAR) 25 MG tablet TAKE 1/2 TABLET BY MOUTH EVERY DAY 09/23/18   Lars Mage, MD  meclizine (ANTIVERT) 25 MG tablet Take 1 tablet (25 mg total) by mouth 3 (three) times daily as needed for dizziness. 09/29/18   Julianne Rice, MD  nitroGLYCERIN (NITROSTAT) 0.4 MG SL tablet Place 0.4 mg under the tongue every 5 (five) minutes as needed for chest pain.    [provider]    Family History No family history on file.  Social History Social History   Tobacco Use  . Smoking status: Former Smoker  Quit date: 11/30/2017    Years since quitting: 0.8  . Smokeless tobacco: Never Used  . Tobacco comment: quit x 45months.  Substance Use Topics  . Alcohol use: No  . Drug use: No     Allergies   Patient has no known allergies.   Review of Systems Review of Systems  Constitutional: Negative for chills and fever.  HENT: Negative for facial swelling, sinus pressure, sore throat and trouble swallowing.   Eyes: Negative for photophobia, pain and visual disturbance.  Respiratory: Negative for cough.   Cardiovascular: Negative for chest pain,  palpitations and leg swelling.  Gastrointestinal: Positive for nausea and vomiting. Negative for abdominal pain, constipation and diarrhea.  Genitourinary: Negative for dysuria, flank pain and frequency.  Musculoskeletal: Positive for gait problem. Negative for back pain, myalgias and neck pain.  Skin: Negative for rash and wound.  Neurological: Positive for dizziness and light-headedness. Negative for syncope, speech difficulty, weakness, numbness and headaches.  All other systems reviewed and are negative.    Physical Exam Updated Vital Signs BP 135/82   Pulse 63   Temp (!) 97.5 F (36.4 C) (Oral)   Resp 17   SpO2 95%   Physical Exam Vitals signs and nursing note reviewed.  Constitutional:      Appearance: Normal appearance. He is well-developed.  HENT:     Head: Normocephalic and atraumatic.     Comments: No facial asymmetry    Nose: Nose normal.     Mouth/Throat:     Mouth: Mucous membranes are moist.  Eyes:     Extraocular Movements: Extraocular movements intact.     Pupils: Pupils are equal, round, and reactive to light.  Neck:     Musculoskeletal: Normal range of motion and neck supple. No neck rigidity or muscular tenderness.  Cardiovascular:     Rate and Rhythm: Regular rhythm. Bradycardia present.     Heart sounds: No murmur. No friction rub. No gallop.   Pulmonary:     Effort: Pulmonary effort is normal. No respiratory distress.     Breath sounds: Normal breath sounds. No stridor. No wheezing, rhonchi or rales.  Chest:     Chest wall: No tenderness.  Abdominal:     General: Bowel sounds are normal. There is no distension.     Palpations: Abdomen is soft. There is no mass.     Tenderness: There is no abdominal tenderness. There is no guarding or rebound.     Hernia: No hernia is present.  Musculoskeletal: Normal range of motion.        General: No swelling, tenderness, deformity or signs of injury.     Right lower leg: No edema.     Left lower leg: No  edema.     Comments: Distal pulses are 2+.  Lymphadenopathy:     Cervical: No cervical adenopathy.  Skin:    General: Skin is warm and dry.     Findings: No erythema or rash.  Neurological:     General: No focal deficit present.     Mental Status: He is alert and oriented to person, place, and time.     Comments: Patient is alert and oriented x3 with clear, goal oriented speech. Patient has 5/5 motor in all extremities. Sensation is intact to light touch.  Mild left finger-to-nose dysmetria.  Right finger-nose testing is normal.    Psychiatric:        Behavior: Behavior normal.      ED Treatments / Results  Labs (all  labs ordered are listed, but only abnormal results are displayed) Labs Reviewed  COMPREHENSIVE METABOLIC PANEL - Abnormal; Notable for the following components:      Result Value   Glucose, Bld 171 (*)    All other components within normal limits  CBC - Abnormal; Notable for the following components:   WBC 13.8 (*)    All other components within normal limits  LIPASE, BLOOD    EKG EKG Interpretation  Date/Time:  Wednesday September 29 2018 12:04:40 EDT Ventricular Rate:  56 PR Interval:  136 QRS Duration: 102 QT Interval:  430 QTC Calculation: 414 R Axis:   -48 Text Interpretation:  Sinus bradycardia Left anterior fascicular block Inferior infarct , age undetermined Abnormal ECG No significant change since last tracing Confirmed by Julianne Rice [phone]) on 09/29/2018 4:44:21 PM   Radiology Mr Brain Wo Contrast  Result Date: 09/29/2018 CLINICAL DATA:  Focal neuro deficit, greater than 6 hours, stroke suspected. EXAM: MRI HEAD WITHOUT CONTRAST TECHNIQUE: Multiplanar, multiecho pulse sequences of the brain and surrounding structures were obtained without intravenous contrast. COMPARISON:  No pertinent prior studies available for comparison. FINDINGS: Brain: There is no evidence of acute infarct. No evidence of intracranial mass. No midline shift or  extra-axial fluid collection. No chronic intracranial hemorrhage. No focal parenchymal signal abnormality Cerebral volume is normal for age. Vascular: Flow voids maintained within the proximal large arterial vessels. Skull and upper cervical spine: Normal marrow signal. Sinuses/Orbits: Large left maxillary sinus mucous retention cyst. Mild scattered mucosal thickening within the remainder of the paranasal sinuses. No significant mastoid effusion. IMPRESSION: No evidence of acute intracranial abnormality, including acute infarct. Paranasal sinus disease, as described, with large left maxillary sinus mucous retention cyst. Electronically Signed   By: Kellie Simmering   On: 09/29/2018 19:01    Procedures Procedures (including critical care time)  Medications Ordered in ED Medications  sodium chloride flush (NS) 0.9 % injection 3 mL (3 mLs Intravenous Not Given 09/29/18 1916)  meclizine (ANTIVERT) tablet 25 mg (25 mg Oral Given 09/29/18 1946)     Initial Impression / Assessment and Plan / ED Course  I have reviewed the triage vital signs and the nursing notes.  Pertinent labs & imaging results that were available during my care of the patient were reviewed by me and considered in my medical decision making (see chart for details).        MRI without evidence of stroke.  Patient does have paranasal sinus disease.  Patient's dizziness has improved after meclizine.  Ambulating without difficulty.  Patient had several episodes where his heart rate would drop into the 40s but he is not doing this consistently.  Blood pressures remained stable.  In addition to treating for peripheral vertigo will also refer to cardiology for episodic bradycardia.  Return precautions given. Final Clinical Impressions(s) / ED Diagnoses   Final diagnoses:  Peripheral vertigo, unspecified laterality  Bradycardia, unspecified    ED Discharge Orders         Ordered    meclizine (ANTIVERT) 25 MG tablet  3 times daily PRN      09/29/18 Crecencio Mc, MD 09/29/18 2007

## 2018-10-03 NOTE — Progress Notes (Deleted)
   CC: ***  HPI:  [name] is a 63 y.o. male with CAD, htn, carcinoma of lateral wall of urinary bladder who presents for follow up of htn. Please see problem based charting for evaluation, assessment, and plan.  The patient's blood pressure during this visit was ***. The patient is currently taking losartan 25mg  qd, imdur 60mg  qd. His/her *** last blood pressure visits are  BP Readings from Last 3 Encounters:  09/29/18 (!) 149/77  05/07/18 121/67  01/29/18 (!) 137/59    The patient does/does not *** report palpitations, dizziness, chest pain, sob ***.  Assessment and Plan ***   Past Medical History:  Diagnosis Date  . CAD (coronary artery disease)   . Hypertension    Review of Systems:  ***  Physical Exam:  There were no vitals filed for this visit. ***  Assessment & Plan:   See Encounters Tab for problem based charting.  Patient {GC/GE:[id number]::"discussed with","seen with"} Dr. {NAMES:[id number]::"Butcher","Granfortuna","E. Hoffman","Mullen","Narendra","Raines","Vincent"}

## 2018-10-05 ENCOUNTER — Encounter: Payer: Self-pay | Admitting: Internal Medicine

## 2018-10-18 ENCOUNTER — Other Ambulatory Visit: Payer: Self-pay

## 2018-10-18 ENCOUNTER — Encounter: Payer: Self-pay | Admitting: Internal Medicine

## 2018-10-18 ENCOUNTER — Ambulatory Visit: Payer: Self-pay | Admitting: Internal Medicine

## 2018-10-18 DIAGNOSIS — H8111 Benign paroxysmal vertigo, right ear: Secondary | ICD-10-CM

## 2018-10-18 DIAGNOSIS — H9201 Otalgia, right ear: Secondary | ICD-10-CM

## 2018-10-18 DIAGNOSIS — J341 Cyst and mucocele of nose and nasal sinus: Secondary | ICD-10-CM

## 2018-10-18 DIAGNOSIS — H8110 Benign paroxysmal vertigo, unspecified ear: Secondary | ICD-10-CM | POA: Insufficient documentation

## 2018-10-18 DIAGNOSIS — Z79899 Other long term (current) drug therapy: Secondary | ICD-10-CM

## 2018-10-18 MED ORDER — MECLIZINE HCL 25 MG PO TABS: 25.0000 mg | ORAL_TABLET | Freq: Three times a day (TID) | ORAL | 1 refills | Status: DC | PRN

## 2018-10-18 NOTE — Progress Notes (Signed)
   CC: Dizziness   HPIMr.Darius El [name] is a 63 y.o. with past medical history below. Please see problem based charting for details of  presentation, assessment , and plan.   Past Medical History:  Diagnosis Date  . CAD (coronary artery disease)   . Hypertension    Review of Systems:  Review of Systems  Constitutional: Negative for fever and malaise/fatigue.  HENT: Positive for ear pain. Negative for ear discharge and sinus pain.   Neurological: Positive for dizziness. Negative for sensory change, speech change, focal weakness, loss of consciousness, weakness and headaches.       Vitals:   10/18/18 1349  BP: (!) 144/79  Pulse: 75  Temp: 98.3 F (36.8 C)  TempSrc: Oral  SpO2: 98%  Weight: 243 lb 3.2 oz (110.3 kg)  Height: 5\' 9"  (1.753 m)   Physical Exam: Physical Exam Constitutional:      Appearance: Normal appearance.  HENT:     Right Ear: Tympanic membrane, ear canal and external ear normal. There is no impacted cerumen.     Left Ear: Tympanic membrane, ear canal and external ear normal. There is no impacted cerumen.     Ears:     Comments: White colored cerumen in right ear. Neurological:     General: No focal deficit present.     Mental Status: He is alert and oriented to person, place, and time.     Cranial Nerves: No cranial nerve deficit.     Sensory: No sensory deficit.     Motor: No weakness.     Comments: Cerebellar testing normal : finger to nose, heel to shin.  Horizontal nystagmus on right eye gaze      Assessment & Plan:   See Encounters Tab for problem based charting.  Patient seen with Dr. Daryll Drown

## 2018-10-18 NOTE — Patient Instructions (Addendum)
Thank you for trusting me with your care. To recap, today we discussed the following:   Benign Paroxysmal Positional Vertigo - Continue Meclizine  - I have attached a instruction sheet for the Epley Maneuver, try this for symptom relief   My best,  Dr. Court Joy

## 2018-10-18 NOTE — Assessment & Plan Note (Addendum)
Patient presented to the ED 9/16 with vertigo w/ emesis . MRI was unrevealing with exception of benign finding of large left maxillary sinus mucous retention cyst. Sent out on Meclizine.  Patient reports Meclizine is helping and then he stop taking it for 3 days with return of his symptoms. He experiences symptoms episodically and they last for a few minutes at a time. Symptoms are triggered by changes in positions ( rolling over in bed, bending over, standing up) The room spins and he experiences nausea. He also has been experiencing right ear pain.   Assessment: Patient appears to have BPPV, orthostatic vital signs normal,. Episodes are triggered by position and are episodic. Horizontal nystagmus on right gaze. Ears has cerumen, but not signs of infection.  Plan: - continue Meclizine   - Gave patient instructions on Epley Maneuver

## 2018-10-19 NOTE — Progress Notes (Signed)
Internal Medicine Clinic Attending  I saw and evaluated the patient.  I personally confirmed the key portions of the history and exam documented by Dr. Court Joy and I reviewed pertinent patient test results.  The assessment, diagnosis, and plan were formulated together and I agree with the documentation in the resident's note.      Dr. Court Joy and I discussed warning signs for a vascular issue and reviewed patients MRI.  Patient reported symptoms were much improved with meclizine.  Hopefully Epley maneuver will also be helpful for him.  If he has any concerning changes to his symptoms, would get MRA and/or refer to ENT.

## 2018-11-01 NOTE — Progress Notes (Signed)
   CC: Hypertension follow up  HPI:  [name] is a 63 y.o. male with hx of stemi s/p rca des and residual lad 70% stenosis, htn, bladder carcinoma s/p resection who presents for hypertension follow up. Please see problem based charting for evaluation, assessment, and plan.  Past Medical History:  Diagnosis Date  . CAD (coronary artery disease)   . Hypertension    Review of Systems:    Review of Systems  Constitutional: Negative for chills and fever.  Respiratory: Negative for cough and shortness of breath.   Cardiovascular: Positive for chest pain (occasionally).  Gastrointestinal: Negative for nausea and vomiting.  Neurological: Negative for dizziness and headaches.   Physical Exam:  Vitals:   11/02/18 1334  BP: (!) 151/63  SpO2: 98%  Weight: 244 lb 1.6 oz (110.7 kg)   Physical Exam  Constitutional: Appears well-developed and well-nourished. No distress.  HENT:  Head: Normocephalic and atraumatic.  Eyes: Conjunctivae are normal.  Cardiovascular: Normal rate, regular rhythm and normal heart sounds.  Respiratory: Effort normal and breath sounds normal. No respiratory distress. No wheezes.  GI: Soft. Bowel sounds are normal. No distension. There is no tenderness.  Musculoskeletal: No edema.  Neurological: Is alert.  Skin: Not diaphoretic. No erythema.  Psychiatric: Normal mood and affect. Behavior is normal. Judgment and thought content normal.    Assessment & Plan:   See Encounters Tab for problem based charting.  Patient discussed with Dr. Heber Avondale

## 2018-11-02 ENCOUNTER — Ambulatory Visit (INDEPENDENT_AMBULATORY_CARE_PROVIDER_SITE_OTHER): Payer: Self-pay | Admitting: Internal Medicine

## 2018-11-02 ENCOUNTER — Encounter: Payer: Self-pay | Admitting: Internal Medicine

## 2018-11-02 VITALS — BP 144/67 | HR 44 | Temp 97.7°F | Ht 69.0 in | Wt 244.1 lb

## 2018-11-02 DIAGNOSIS — Z7982 Long term (current) use of aspirin: Secondary | ICD-10-CM

## 2018-11-02 DIAGNOSIS — I10 Essential (primary) hypertension: Secondary | ICD-10-CM

## 2018-11-02 DIAGNOSIS — Z0000 Encounter for general adult medical examination without abnormal findings: Secondary | ICD-10-CM

## 2018-11-02 DIAGNOSIS — Z95820 Peripheral vascular angioplasty status with implants and grafts: Secondary | ICD-10-CM

## 2018-11-02 DIAGNOSIS — I2510 Atherosclerotic heart disease of native coronary artery without angina pectoris: Secondary | ICD-10-CM

## 2018-11-02 DIAGNOSIS — E079 Disorder of thyroid, unspecified: Secondary | ICD-10-CM

## 2018-11-02 DIAGNOSIS — I252 Old myocardial infarction: Secondary | ICD-10-CM

## 2018-11-02 DIAGNOSIS — Z8551 Personal history of malignant neoplasm of bladder: Secondary | ICD-10-CM

## 2018-11-02 DIAGNOSIS — Z79899 Other long term (current) drug therapy: Secondary | ICD-10-CM

## 2018-11-02 DIAGNOSIS — I25118 Atherosclerotic heart disease of native coronary artery with other forms of angina pectoris: Secondary | ICD-10-CM

## 2018-11-02 MED ORDER — LOSARTAN POTASSIUM 25 MG PO TABS: 25.0000 mg | ORAL_TABLET | Freq: Every day | ORAL | 1 refills | Status: DC

## 2018-11-02 NOTE — Patient Instructions (Addendum)
It was a pleasure to see you today [name]. Please make the following changes:  Your blood pressure during this visit was elevated, please increase losartan from 12.5mg  daily to 25mg  daily.   If you have any questions or concerns, please call our clinic at [phone] between 9am-5pm and after hours call [phone] and ask for the internal medicine resident on call. If you feel you are having a medical emergency please call 911.   Thank you, we look forward to help you remain healthy!  Lars Mage, MD Internal Medicine PGY3

## 2018-11-03 NOTE — Assessment & Plan Note (Addendum)
CT soft tissue of neck done January 2020 showed mildly enlarged thyroid gland without nodularity or mediastinal extension. TSH is 1.16 November 2017.   -Will continue to monitor

## 2018-11-03 NOTE — Assessment & Plan Note (Signed)
The patient's blood pressure during this visit was 151/63 and repeat was 144/67. The patient is currently taking losartan 12.5mg  qd, imdur 60mg  qd. His last blood pressure visits are  BP Readings from Last 3 Encounters:  11/02/18 (!) 144/67  10/18/18 (!) 144/79  09/29/18 (!) 149/77   Assessment and Plan The patient's blood pressure is above goal bp of 130/80. Increased patient's losartan to 25mg  qd. Counseled patient to do moderate exercise >181min weekly.

## 2018-11-03 NOTE — Assessment & Plan Note (Signed)
The patient states that he occasionally notes chest 2-3 times per month. He last noted the pain in his chest last night when he was laying down. He states that it lasted 1 hour.   The patient is currently taking imdur 60mg  qd, aspirin 81mg  qd and atorvastatin 80mg  qd.   Assessment and plan  The patient is had a stemi got a prox and mid rca stent placed. HE still has a prox lad (65% stenosis) and 2nd marginal lesion (70% stenosis). He had an ekg done 09/30/18 without any ischemic changes.   The patient's remaining regions of stenosis maybe contributing to some continued chest pain occasionally.   Will continue to optimize medical therapy. The patient is to resume physical activity. Follow up with cardiology.

## 2018-11-03 NOTE — Assessment & Plan Note (Signed)
Patient refused influenza vaccine and mentioned he may get it at a pharmacy.

## 2018-11-07 NOTE — Progress Notes (Signed)
Internal Medicine Clinic Attending  Case discussed with Dr. Chundi at the time of the visit.  We reviewed the resident's history and exam and pertinent patient test results.  I agree with the assessment, diagnosis, and plan of care documented in the resident's note. 

## 2018-11-11 NOTE — Progress Notes (Addendum)
CARDIOLOGY OFFICE NOTE  Date:  11/16/2018    Darius [name] Date of Birth: February 09, 1955 Medical Record [id number]  PCP:  Lars Mage, MD  Cardiologist:  Tamala Julian    Chief Complaint  Patient presents with   Follow-up    History of Present Illness: Darius [name] is a 63 y.o. male who presents today for a follow up visit. Seen for Dr. Tamala Julian.   He has a history of known CAD with prior inferior STEMI treated with RCA DES and has residual LAD 70% stenosis. He had urologic bleeding on asa/brilinta -->diagnosis of bladder cancer which was resected in 12/2017. Now on asa /clopidogrl with stable hgb. Other issues include Behcet's disease, tobacco use, HLD and HTN.   Had a telehealth visit with Dr. Tamala Julian back in April - was doing well. No bleeding with aspirin/Plavix. Pretty sedentary. Cardiac status felt to be ok.   The patient does not have symptoms concerning for COVID-19 infection (fever, chills, cough, or new shortness of breath).   Comes in today. Here alone. Seems to be doing ok. Remains pretty sedentary. No chest pain. Will get a little winded if he over exerts. Still smoking a few cigarettes at times. Had a spell of vertigo back in September - MRI without evidence of stroke. Was given Meclizine but that made him too sleepy - resolved with Epley maneuvers. Has not recurred. Tolerating his medicines. He is asking about Viagra - he is on Imdur. He follows with urology every 4 months.   Past Medical History:  Diagnosis Date   CAD (coronary artery disease)    Hypertension     Past Surgical History:  Procedure Laterality Date   CORONARY/GRAFT ACUTE MI REVASCULARIZATION N/A 11/30/2017   Procedure: Coronary/Graft Acute MI Revascularization;  Surgeon: Belva Crome, MD;  Location: [facility];  Service: Cardiovascular;  Laterality: N/A;   LEFT HEART CATH AND CORONARY ANGIOGRAPHY N/A 11/30/2017   Procedure: LEFT HEART CATH AND CORONARY ANGIOGRAPHY;  Surgeon: Belva Crome, MD;  Location: [facility];  Service: Cardiovascular;  Laterality: N/A;   TRANSURETHRAL RESECTION OF BLADDER TUMOR N/A 12/16/2017   Procedure: TRANSURETHRAL RESECTION OF BLADDER TUMOR (TURBT);  Surgeon: Franchot Gallo, MD;  Location: [facility];  Service: Urology;  Laterality: N/A;  90 MINS     Medications: Current Meds  Medication Sig   aspirin EC 81 MG tablet Take 1 tablet (81 mg total) by mouth daily.   atorvastatin (LIPITOR) 80 MG tablet TAKE 1 TABLET BY MOUTH DAILY AT 6PM   clopidogrel (PLAVIX) 75 MG tablet Take 1 tablet (75 mg total) by mouth daily.   diclofenac sodium (VOLTAREN) 1 % GEL Apply 2 g topically as needed.   isosorbide mononitrate (IMDUR) 60 MG 24 hr tablet TAKE 1 TABLET BY MOUTH EVERY DAY   losartan (COZAAR) 25 MG tablet Take 1 tablet (25 mg total) by mouth daily.   meclizine (ANTIVERT) 25 MG tablet Take 1 tablet (25 mg total) by mouth 3 (three) times daily as needed for dizziness.   nitroGLYCERIN (NITROSTAT) 0.4 MG SL tablet Place 0.4 mg under the tongue every 5 (five) minutes as needed for chest pain.     Allergies: No Known Allergies  Social History: The patient  reports that he has been smoking. He has been smoking about 0.50 packs per day. He has never used smokeless tobacco. He reports that he does not drink alcohol or use drugs.   Family History: The patient's family history  is not on file.   Review of Systems: Please see the history of present illness.   All other systems are reviewed and negative.   Physical Exam: VS:  BP 124/80    Pulse 94    Ht 5\' 9"  (1.753 m)    Wt 242 lb (109.8 kg)    SpO2 96%    BMI 35.74 kg/m  .  BMI Body mass index is 35.74 kg/m.  Wt Readings from Last 3 Encounters:  11/16/18 242 lb (109.8 kg)  11/02/18 244 lb 1.6 oz (110.7 kg)  10/18/18 243 lb 3.2 oz (110.3 kg)    General: Pleasant. Alert. He is in no acute distress. He remains obese. He looks older than his stated age.    HEENT: Normal.  Neck:  Supple, no JVD, carotid bruits, or masses noted.  Cardiac: Regular rate and rhythm. No murmurs, rubs, or gallops. No edema.  Respiratory:  Lungs are clear to auscultation bilaterally with normal work of breathing.  GI: Soft and nontender.  MS: No deformity or atrophy. Gait and ROM intact.  Skin: Warm and dry. Color is normal.  Neuro:  Strength and sensation are intact and no gross focal deficits noted.  Psych: Alert, appropriate and with normal affect.   LABORATORY DATA:  EKG:  EKG is not ordered today.  Lab Results  Component Value Date   WBC 13.8 (H) 09/29/2018   HGB 16.7 09/29/2018   HCT 48.9 09/29/2018   PLT 284 09/29/2018   GLUCOSE 171 (H) 09/29/2018   CHOL 93 (L) 02/12/2018   TRIG 87 02/12/2018   HDL 31 (L) 02/12/2018   LDLCALC 45 02/12/2018   ALT 22 09/29/2018   AST 20 09/29/2018   NA 139 09/29/2018   K 4.4 09/29/2018   CL 104 09/29/2018   CREATININE 0.83 09/29/2018   BUN 13 09/29/2018   CO2 24 09/29/2018   TSH 1.044 11/30/2017   INR 1.05 11/30/2017   HGBA1C 6.5 (H) 11/30/2017     BNP (last 3 results) No results for input(s): BNP in the last 8760 hours.  ProBNP (last 3 results) No results for input(s): PROBNP in the last 8760 hours.   Other Studies Reviewed Today:  ECHO Study Conclusions 11/2017  - Left ventricle: The cavity size was normal. Wall thickness was   increased in a pattern of moderate LVH. Systolic function was   normal. The estimated ejection fraction was in the range of 60%   to 65%. Inferobasal hypokinesis. The study is not technically   sufficient to allow evaluation of LV diastolic function. - Mitral valve: Mildly thickened leaflets . There was trivial   regurgitation. - Left atrium: The atrium was normal in size. - Inferior vena cava: The vessel was dilated. The respirophasic   diameter changes were blunted (< 50%), consistent with elevated   central venous pressure.   Coronary/Graft Acute MI Revascularization 11/2017  LEFT  HEART CATH AND CORONARY ANGIOGRAPHY  Conclusion    A stent was successfully placed.  A stent was successfully placed.    Inferior ST elevation myocardial infarction due to 100% occlusion of the proximal RCA.  Successful therapy with proximal and distal vessel stenting reducing 100% and 80% stenoses to 10% and 0% respectively with improvement in TIMI flow from 0 to III.  22 x 4.0 mm Onyx stents were used with the proximal stent being postdilated to 4.5 mm in diameter.  Normal left main.  LAD contains segmental 60 to 70% mid vessel narrowing.  Large normal  ramus intermedius.  Circumflex gives origin to 2 small obtuse marginal branches with a second marginal containing 60 to 70% diffuse disease in the mid vessel.  Inferobasal akinesis, overall LVEF greater than 60%.  LVEDP 20.  Delayed reperfusion due to tortuous, angulated innominate/aortic arch anatomy.  Procedure complicated by Bezold-Jarisch response with profound hypotension and bradycardia requiring intravenous atropine and IV Levophed for heart rate and blood pressure support.  RECOMMENDATIONS:   IV fluid at a rate of 100 cc/h.  Wean Levophed once blood pressure is consistently above 100 mmHg.  Continue IV ReoPro for 8 hours.  Aspirin and Plavix dual antiplatelet therapy for 12 months.  High intensity statin therapy.  Institute beta-blocker therapy as tolerated by heart rate and blood pressure.  Consider angiotensin converting enzyme inhibitor or angiotensin receptor blocker for blood pressure control if required.  Patient has been on diuretic therapy as monotherapy for hypertension.  Smoking cessation.    Recommend uninterrupted dual antiplatelet therapy with Aspirin 81mg  daily and Ticagrelor 90mg  twice daily for a minimum of 12 months (ACS - Class I recommendation).     ASSESSMENT & PLAN:    1. CAD with prior PCI - he is basically one year out from his PCI/MI - he does have residual disease - managed  medically. Unfortunately, continues to smoke. He has no active symptoms. He remains very sedentary. Will get Dr. Thompson Caul input on continuing or stopping Plavix. Encouraged daily walking and to work on his weight along with smoking cessation.   2. HLD - labs with PCP - due again in January.   3. HTN - BP is fine - no changes made today.   4. Bladder cancer - per Urology  5. Tobacco abuse - total cessation encouraged.  6. ED - would not be a candidate for Viagra given his use of long acting nitrate therapy for residual CAD.   7. COVID-19 Education: The signs and symptoms of COVID-19 were discussed with the patient and how to seek care for testing (follow up with PCP or arrange E-visit).  The importance of social distancing, staying at home, hand hygiene and wearing a mask when out in public were discussed today.  Current medicines are reviewed with the patient today.  The patient does not have concerns regarding medicines other than what has been noted above.  The following changes have been made:  See above.  Labs/ tests ordered today include:   No orders of the defined types were placed in this encounter.    Disposition:   FU with Dr. Tamala Julian in 6 months. I am happy to see back as needed.   Patient is agreeable to this plan and will call if any problems develop in the interim.   SignedTruitt Merle, NP  11/16/2018 3:37 PM  [facility] [address] [place], [place]  [address] Phone: [phone] Fax: [phone]      Addendum: 11/17/18  Belva Crome, MD sent to Burtis Junes, NP        Continue plavix. Okay to stop aspirin.

## 2018-11-16 ENCOUNTER — Encounter: Payer: Self-pay | Admitting: Nurse Practitioner

## 2018-11-16 ENCOUNTER — Other Ambulatory Visit: Payer: Self-pay

## 2018-11-16 ENCOUNTER — Ambulatory Visit (INDEPENDENT_AMBULATORY_CARE_PROVIDER_SITE_OTHER): Payer: Self-pay | Admitting: Nurse Practitioner

## 2018-11-16 VITALS — BP 124/80 | HR 94 | Ht 69.0 in | Wt 242.0 lb

## 2018-11-16 DIAGNOSIS — E782 Mixed hyperlipidemia: Secondary | ICD-10-CM

## 2018-11-16 DIAGNOSIS — I10 Essential (primary) hypertension: Secondary | ICD-10-CM

## 2018-11-16 DIAGNOSIS — I25118 Atherosclerotic heart disease of native coronary artery with other forms of angina pectoris: Secondary | ICD-10-CM

## 2018-11-16 DIAGNOSIS — Z7189 Other specified counseling: Secondary | ICD-10-CM

## 2018-11-16 DIAGNOSIS — Z87891 Personal history of nicotine dependence: Secondary | ICD-10-CM

## 2018-11-16 DIAGNOSIS — Z95820 Peripheral vascular angioplasty status with implants and grafts: Secondary | ICD-10-CM

## 2018-11-16 NOTE — Patient Instructions (Addendum)
After Visit Summary:  We will be checking the following labs today - NONE   Medication Instructions:    Continue with your current medicines.   I will talk with Dr. Tamala Julian about continuing or stopping Plavix   If you need a refill on your cardiac medications before your next appointment, please call your pharmacy.     Testing/Procedures To Be Arranged:  N/A  Follow-Up:   See Dr. Tamala Julian in 6 months -  You will receive a reminder letter in the mail two months in advance. If you don't receive a letter, please call our office to schedule the follow-up appointment.      At [facility], you and your health needs are our priority.  As part of our continuing mission to provide you with exceptional heart care, we have created designated Provider Care Teams.  These Care Teams include your primary Cardiologist (physician) and Advanced Practice Providers (APPs -  Physician Assistants and Nurse Practitioners) who all work together to provide you with the care you need, when you need it.  Special Instructions:  . Stay safe, stay home, wash your hands for at least 20 seconds and wear a mask when out in public.  . It was good to talk with you today.  Marland Kitchen Keep working on not smoking . Walking every day!   Call the [place] office at [phone] if you have any questions, problems or concerns.

## 2018-11-17 ENCOUNTER — Other Ambulatory Visit: Payer: Self-pay | Admitting: *Deleted

## 2018-11-17 ENCOUNTER — Telehealth: Payer: Self-pay | Admitting: *Deleted

## 2018-11-17 NOTE — Telephone Encounter (Signed)
-----   Message from Burtis Junes, NP sent at 11/17/2018  7:55 AM EST ----- Will you please call and let him know Dr. Tamala Julian said to continue Plavix and may stop his aspirin.  Will otherwise, see back as planned.   Cecille Rubin ----- Message ----- From: Belva Crome, MD Sent: 11/16/2018   6:09 PM EST To: Burtis Junes, NP  Continue plavix. Okay to stop aspirin. ----- Message ----- From: Burtis Junes, NP Sent: 11/16/2018   4:11 PM EST To: Belva Crome, MD  Dr. Tamala Julian,  Your thoughts on continuing Plavix - he is one year out from PCI - has residual disease managed medically with ongoing tobacco use.  Cecille Rubin

## 2018-11-17 NOTE — Telephone Encounter (Signed)
S/w pt is aware of Dr. Thompson Caul recommendation's to d/c ASA, pt's medication list updated.

## 2018-12-13 ENCOUNTER — Other Ambulatory Visit: Payer: Self-pay | Admitting: Internal Medicine

## 2018-12-13 ENCOUNTER — Other Ambulatory Visit: Payer: Self-pay | Admitting: Cardiology

## 2018-12-14 ENCOUNTER — Other Ambulatory Visit: Payer: Self-pay | Admitting: Internal Medicine

## 2018-12-14 DIAGNOSIS — H8111 Benign paroxysmal vertigo, right ear: Secondary | ICD-10-CM

## 2018-12-14 MED ORDER — CLOPIDOGREL BISULFATE 75 MG PO TABS: 75.0000 mg | ORAL_TABLET | Freq: Every day | ORAL | 1 refills | Status: DC

## 2018-12-14 MED ORDER — MECLIZINE HCL 25 MG PO TABS: 25.0000 mg | ORAL_TABLET | Freq: Three times a day (TID) | ORAL | 1 refills | Status: DC | PRN

## 2018-12-14 NOTE — Telephone Encounter (Signed)
Needs refill on all medicine; pt contact [phone]  [id number] - [place], [place]

## 2018-12-14 NOTE — Addendum Note (Signed)
Addended by: Ebbie Latus on: 12/14/2018 11:44 AM   Modules accepted: Orders

## 2019-03-20 ENCOUNTER — Other Ambulatory Visit: Payer: Self-pay | Admitting: Internal Medicine

## 2019-04-04 ENCOUNTER — Telehealth: Payer: Self-pay | Admitting: Internal Medicine

## 2019-04-04 NOTE — Telephone Encounter (Signed)
rtc to pt, reassured that it is ok for him to sign up for COVID Vaccine. He ask if he should make them aware of being on blood thinners, he was told he may do so. Wished him well.

## 2019-04-04 NOTE — Telephone Encounter (Signed)
Pt is wanting to know if he get vaccine [phone]

## 2019-04-04 NOTE — Telephone Encounter (Signed)
Thank you. He should get vaccine.

## 2019-05-07 ENCOUNTER — Other Ambulatory Visit: Payer: Self-pay | Admitting: Internal Medicine

## 2019-05-07 ENCOUNTER — Other Ambulatory Visit: Payer: Self-pay | Admitting: Cardiology

## 2019-05-14 ENCOUNTER — Other Ambulatory Visit: Payer: Self-pay | Admitting: Internal Medicine

## 2019-05-15 NOTE — Progress Notes (Signed)
Cardiology Office Note   Date:  05/16/2019   ID:  [name], Darius 1955/08/15, MRN [id number]  PCP:  Lars Mage, MD  Cardiologist:  Dr. Tamala Julian  Chief Complaint  Patient presents with  . Follow-up    History of Present Illness: Darius [name] is a 64 y.o. male who presents for follow-up for CAD, seen for Dr. Tamala Julian.  [name] has a history of CAD wit.  H prior inferior STEMI treated with RCA DES and known residual LAD 70% stenosis. He had post stenting urologic bleeding on ASA and Brilinta and was eventually diagnosed with bladder cancer treated with resection 12/2017. He is currently on ASA and Plavix with stable hemoglobin. Also has a history of Behcet's disease, tobacco use, HLD and hypertension.  He was  seen by Dr. Tamala Julian 04/2018 and seemed to be doing well with no recurrent bleeding issues and no CV symptoms. Last seen by Truitt Merle, NP 11/16/2018. He had no anginal symptoms however with get winded with exertion. He continues to smoke cigarettes. Had an episode of vertigo in 09/2018 in which he underwent an MRI with no evidence of CVA.   He presents today and is doing well from a CV standpoint.  He denies anginal symptoms.  Does report some very mild shortness of breath however this is unchanged and was present prior to his STEMI.  Denies LE edema, palpitations, orthopnea, PND, dizziness or syncope.  BP is stable today at 118/76.   Past Medical History:  Diagnosis Date  . CAD (coronary artery disease)   . Hypertension     Past Surgical History:  Procedure Laterality Date  . CORONARY/GRAFT ACUTE MI REVASCULARIZATION N/A 11/30/2017   Procedure: Coronary/Graft Acute MI Revascularization;  Surgeon: Belva Crome, MD;  Location: [facility];  Service: Cardiovascular;  Laterality: N/A;  . LEFT HEART CATH AND CORONARY ANGIOGRAPHY N/A 11/30/2017   Procedure: LEFT HEART CATH AND CORONARY ANGIOGRAPHY;  Surgeon: Belva Crome, MD;  Location: [facility];   Service: Cardiovascular;  Laterality: N/A;  . TRANSURETHRAL RESECTION OF BLADDER TUMOR N/A 12/16/2017   Procedure: TRANSURETHRAL RESECTION OF BLADDER TUMOR (TURBT);  Surgeon: Franchot Gallo, MD;  Location: [facility];  Service: Urology;  Laterality: N/A;  90 MINS     Current Outpatient Medications  Medication Sig Dispense Refill  . atorvastatin (LIPITOR) 80 MG tablet TAKE 1 TABLET BY MOUTH DAILY AT 6PM 90 tablet 1  . clopidogrel (PLAVIX) 75 MG tablet TAKE 1 TABLET BY MOUTH EVERY DAY 90 tablet 1  . diclofenac sodium (VOLTAREN) 1 % GEL Apply 2 g topically as needed. 1 Tube 0  . isosorbide mononitrate (IMDUR) 60 MG 24 hr tablet Take 1 tablet (60 mg total) by mouth daily. 90 tablet 1  . losartan (COZAAR) 25 MG tablet TAKE 1 TABLET BY MOUTH EVERY DAY 90 tablet 1  . meclizine (ANTIVERT) 25 MG tablet Take 1 tablet (25 mg total) by mouth 3 (three) times daily as needed for dizziness. 30 tablet 1  . nitroGLYCERIN (NITROSTAT) 0.4 MG SL tablet Place 0.4 mg under the tongue every 5 (five) minutes as needed for chest pain.     No current facility-administered medications for this visit.    Allergies:   Patient has no known allergies.    Social History:  The patient  reports that he has been smoking. He has been smoking about 0.50 packs per day. He has never used smokeless tobacco. He reports that he  does not drink alcohol or use drugs.   Family History:  The patient's family history is not on file.    ROS:  Please see the history of present illness. Otherwise, review of systems are positive for none.  All other systems are reviewed and negative.    PHYSICAL EXAM: VS:  BP 118/76   Pulse (!) 58   Ht 5\' 8"  (1.727 m)   Wt 240 lb 12.8 oz (109.2 kg)   SpO2 95%   BMI 36.61 kg/m  , BMI Body mass index is 36.61 kg/m.   General: Well developed, well nourished, NAD Neck: Negative for carotid bruits. No JVD Lungs:Clear to ausculation bilaterally. Breathing is unlabored. Cardiovascular: RRR with S1  S2. No murmurs Extremities: No edema. Radial pulses 2+ bilaterally Neuro: Alert and oriented. No focal deficits. No facial asymmetry. MAE spontaneously. Psych: Responds to questions appropriately with normal affect.    EKG:  EKG is not ordered today.   Recent Labs: 09/29/2018: ALT 22; BUN 13; Creatinine, Ser 0.83; Hemoglobin 16.7; Platelets 284; Potassium 4.4; Sodium 139   Lipid Panel    Component Value Date/Time   CHOL 93 (L) 02/12/2018 1156   TRIG 87 02/12/2018 1156   HDL 31 (L) 02/12/2018 1156   CHOLHDL 3.0 02/12/2018 1156   CHOLHDL 5.0 11/30/2017 0343   VLDL 15 11/30/2017 0343   LDLCALC 45 02/12/2018 1156      Wt Readings from Last 3 Encounters:  05/16/19 240 lb 12.8 oz (109.2 kg)  11/16/18 242 lb (109.8 kg)  11/02/18 244 lb 1.6 oz (110.7 kg)     Other studies Reviewed: Additional studies/ records that were reviewed today include:   ECHO Study Conclusions 11/2017  - Left ventricle: The cavity size was normal. Wall thickness was increased in a pattern of moderate LVH. Systolic function was normal. The estimated ejection fraction was in the range of 60% to 65%. Inferobasal hypokinesis. The study is not technically sufficient to allow evaluation of LV diastolic function. - Mitral valve: Mildly thickened leaflets . There was trivial regurgitation. - Left atrium: The atrium was normal in size. - Inferior vena cava: The vessel was dilated. The respirophasic diameter changes were blunted (<50%), consistent with elevated central venous pressure.   Coronary/Graft Acute MI Revascularization 11/2017  LEFT HEART CATH AND CORONARY ANGIOGRAPHY  Conclusion    A stent was successfully placed.  A stent was successfully placed.   Inferior ST elevation myocardial infarction due to 100% occlusion of the proximal RCA. Successful therapy with proximal and distal vessel stenting reducing 100% and 80% stenoses to 10% and 0% respectively with improvement in  TIMI flow from 0 to III. 22 x 4.0 mm Onyx stents were used with the proximal stent being postdilated to 4.5 mm in diameter.  Normal left main.  LAD contains segmental 60 to 70% mid vessel narrowing.  Large normal ramus intermedius.  Circumflex gives origin to 2 small obtuse marginal branches with a second marginal containing 60 to 70% diffuse disease in the mid vessel.  Inferobasal akinesis, overall LVEF greater than 60%. LVEDP 20.  Delayed reperfusion due to tortuous, angulated innominate/aortic arch anatomy.  Procedure complicated by Bezold-Jarisch response with profound hypotension and bradycardia requiring intravenous atropine and IV Levophed for heart rate and blood pressure support.  RECOMMENDATIONS:   IV fluid at a rate of 100 cc/h.  Wean Levophed once blood pressure is consistently above 100 mmHg.  Continue IV ReoPro for 8 hours.  Aspirin and Plavix dual antiplatelet therapy for 12  months.  High intensity statin therapy.  Institute beta-blocker therapy as tolerated by heart rate and blood pressure. Consider angiotensin converting enzyme inhibitor or angiotensin receptor blocker for blood pressure control if required. Patient has been on diuretic therapy as monotherapy for hypertension.  Smoking cessation.    ASSESSMENT AND PLAN:  1. CAD with prior PCI: -Was last seen 11/16/2018 with no anginal symptoms -Denies chest pain today -ASA was stopped at last OV with continuation of Plavix -Will clarify with Dr. Tamala Julian  2. HLD: -Labs per PCP -Continue atorvastatin -LDL goal <70  3. HTN: -Stable, 118/76 -Continue current regimen -BP goal 130/80  4. Bladder cancer: -Follows closely with urology -No recurrent bleeding   Current medicines are reviewed at length with the patient today.  The patient does not have concerns regarding medicines.  The following changes have been made:  no change  Labs/ tests ordered today include: None  No orders of the defined  types were placed in this encounter.   Disposition:   FU with Dr. Tamala Julian  in months  Signed, Kathyrn Drown, NP  05/16/2019 3:54 PM    [facility], [place], [place]  [address] Phone: [phone]; Fax: [phone]

## 2019-05-16 ENCOUNTER — Encounter: Payer: Self-pay | Admitting: Cardiology

## 2019-05-16 ENCOUNTER — Ambulatory Visit (INDEPENDENT_AMBULATORY_CARE_PROVIDER_SITE_OTHER): Payer: Self-pay | Admitting: Cardiology

## 2019-05-16 ENCOUNTER — Other Ambulatory Visit: Payer: Self-pay

## 2019-05-16 VITALS — BP 118/76 | HR 58 | Ht 68.0 in | Wt 240.8 lb

## 2019-05-16 DIAGNOSIS — Z87891 Personal history of nicotine dependence: Secondary | ICD-10-CM

## 2019-05-16 DIAGNOSIS — I25118 Atherosclerotic heart disease of native coronary artery with other forms of angina pectoris: Secondary | ICD-10-CM

## 2019-05-16 DIAGNOSIS — Z95820 Peripheral vascular angioplasty status with implants and grafts: Secondary | ICD-10-CM

## 2019-05-16 DIAGNOSIS — E782 Mixed hyperlipidemia: Secondary | ICD-10-CM

## 2019-05-16 DIAGNOSIS — I10 Essential (primary) hypertension: Secondary | ICD-10-CM

## 2019-05-16 NOTE — Patient Instructions (Signed)
Medication Instructions:   Your physician recommends that you continue on your current medications as directed. Please refer to the Current Medication list given to you today.  *If you need a refill on your cardiac medications before your next appointment, please call your pharmacy*  Lab Work:  None ordered today  Testing/Procedures:  None ordered today  Follow-Up: At [facility], you and your health needs are our priority.  As part of our continuing mission to provide you with exceptional heart care, we have created designated Provider Care Teams.  These Care Teams include your primary Cardiologist (physician) and Advanced Practice Providers (APPs -  Physician Assistants and Nurse Practitioners) who all work together to provide you with the care you need, when you need it.  We recommend signing up for the patient portal called "MyChart".  Sign up information is provided on this After Visit Summary.  MyChart is used to connect with patients for Virtual Visits (Telemedicine).  Patients are able to view lab/test results, encounter notes, upcoming appointments, etc.  Non-urgent messages can be sent to your provider as well.   To learn more about what you can do with MyChart, go to [email or web address].    Your next appointment:   6 months  The format for your next appointment:   In Person  Provider:   Daneen Schick, MD

## 2019-06-28 ENCOUNTER — Encounter: Payer: Self-pay | Admitting: Internal Medicine

## 2019-07-01 ENCOUNTER — Encounter: Payer: Self-pay | Admitting: *Deleted

## 2019-09-14 DIAGNOSIS — Z8616 Personal history of COVID-19: Secondary | ICD-10-CM

## 2019-09-14 HISTORY — DX: Personal history of COVID-19: Z86.16

## 2019-09-16 ENCOUNTER — Other Ambulatory Visit: Payer: Self-pay | Admitting: Nurse Practitioner

## 2019-09-21 ENCOUNTER — Other Ambulatory Visit: Payer: Self-pay | Admitting: *Deleted

## 2019-09-21 DIAGNOSIS — I25118 Atherosclerotic heart disease of native coronary artery with other forms of angina pectoris: Secondary | ICD-10-CM

## 2019-09-23 ENCOUNTER — Telehealth: Payer: Self-pay | Admitting: Student

## 2019-09-23 MED ORDER — ATORVASTATIN CALCIUM 80 MG PO TABS: ORAL_TABLET | ORAL | 1 refills | Status: DC

## 2019-09-23 MED ORDER — CLOPIDOGREL BISULFATE 75 MG PO TABS: 75.0000 mg | ORAL_TABLET | Freq: Every day | ORAL | 1 refills | Status: DC

## 2019-09-23 NOTE — Telephone Encounter (Signed)
Pt missed called, pt return called [phone]

## 2019-09-23 NOTE — Telephone Encounter (Signed)
Message noted from Dr. Shon Baton to have patient schedule PCP appt. Will forward to front desk pool. Thank you, SChaplin, RN,BSN

## 2019-10-28 ENCOUNTER — Ambulatory Visit (INDEPENDENT_AMBULATORY_CARE_PROVIDER_SITE_OTHER): Payer: Self-pay | Admitting: Student

## 2019-10-28 ENCOUNTER — Other Ambulatory Visit: Payer: Self-pay

## 2019-10-28 ENCOUNTER — Encounter: Payer: Self-pay | Admitting: Student

## 2019-10-28 VITALS — BP 121/66 | HR 77 | Temp 97.5°F | Ht 68.0 in | Wt 245.8 lb

## 2019-10-28 DIAGNOSIS — I25118 Atherosclerotic heart disease of native coronary artery with other forms of angina pectoris: Secondary | ICD-10-CM

## 2019-10-28 DIAGNOSIS — Z87891 Personal history of nicotine dependence: Secondary | ICD-10-CM

## 2019-10-28 DIAGNOSIS — C672 Malignant neoplasm of lateral wall of bladder: Secondary | ICD-10-CM

## 2019-10-28 DIAGNOSIS — Z0000 Encounter for general adult medical examination without abnormal findings: Secondary | ICD-10-CM

## 2019-10-28 DIAGNOSIS — I10 Essential (primary) hypertension: Secondary | ICD-10-CM

## 2019-10-28 NOTE — Assessment & Plan Note (Addendum)
The patient states he still ocssionally has chest pain that occurs about 2-3 times a month. He feels better after sitting down and taking some deep breaths. Notes that his chest pain has not increased in frequency or changed in pattern since prior visit.   Last seen at cardiology office in May 2021. No changes at that time. Dr. Tamala Julian discontinued his ASA now currently taking Plavix 75 mg, Imdur 60 mg daily, atorvastatin 80 mg  And losartan 25 mg daily.   Plan Will continue on current medications Lipid panel today Encouraged patient to increase physical activity Encouraged patient to stop smoking

## 2019-10-28 NOTE — Assessment & Plan Note (Addendum)
No further hematuria. States last visit with urology was told he most likely did not need continued follow up, but he wanted to continue to be monitored. Has follow up in November.

## 2019-10-28 NOTE — Assessment & Plan Note (Signed)
Currently smoking again. State he smokes about 10 cigarettes a day. He understand the risk of smoking and is planning on quitting again. States wife still smokes a significant amount. Encouraged him to smoke, he feels he will be successful on his own as he quit after MI in the past.

## 2019-10-28 NOTE — Patient Instructions (Addendum)
It was a pleasure meeting you in clinic.  Today we discussed:   Coronary artery disease: Please continue you current medication and let us know if you have worsening chest.   Hypertension and high cholesterol: You blood pressure is excellent today we will update you blood work and I will call you if anything is not normal.  Colonoscopy: Please return in 6 months to so we can discuss having a colonoscopy done at that time.  If you have any questions or concerns, please call our clinic at [phone] between 9am-5pm and after hours call [phone] and ask for the internal medicine resident on call. If you feel you are having a medical emergency please call 911.   Thank you, we look forward to helping you remain healthy!

## 2019-10-28 NOTE — Assessment & Plan Note (Signed)
Patient is normotensive at 121/66 today. Currently taking losartan 25 mg daily and Imdur 60 mg daily. Doing well on current therapy.  Plan Continue current medications BMP today

## 2019-10-28 NOTE — Progress Notes (Signed)
   CC: Follow up  HPI:  [name] is a 64 y.o. m with a history of CAD with MI in 2019 sp DES, bladder wall cancer, hypertension, hyperlipidemia, behcet's disease presents for follow.   Past Medical History:  Diagnosis Date  . CAD (coronary artery disease)   . Hypertension    Review of Systems:  Negative as per HPI.   Physical Exam:  Vitals:   10/28/19 1334  BP: 121/66  Pulse: 77  Temp: (!) 97.5 F (36.4 C)  TempSrc: Oral  SpO2: 97%  Weight: 245 lb 12.8 oz (111.5 kg)  Height: 5\' 8"  (1.727 m)   Physical Exam Constitutional:      Appearance: Normal appearance.  HENT:     Head: Normocephalic and atraumatic.     Right Ear: External ear normal.     Left Ear: External ear normal.     Mouth/Throat:     Mouth: Mucous membranes are moist.     Pharynx: Oropharynx is clear.  Eyes:     Extraocular Movements: Extraocular movements intact.     Pupils: Pupils are equal, round, and reactive to light.  Cardiovascular:     Rate and Rhythm: Normal rate and regular rhythm.     Pulses: Normal pulses.     Heart sounds: Normal heart sounds.  Pulmonary:     Effort: Pulmonary effort is normal.     Breath sounds: Normal breath sounds.  Abdominal:     General: Abdomen is flat.     Palpations: Abdomen is soft.  Musculoskeletal:     Cervical back: Normal range of motion and neck supple.     Right lower leg: Edema present.     Left lower leg: Edema present.  Skin:    General: Skin is warm and dry.     Capillary Refill: Capillary refill takes less than 2 seconds.  Neurological:     General: No focal deficit present.     Mental Status: He is alert. Mental status is at baseline.      Assessment & Plan:   See Encounters Tab for problem based charting.  Patient seen with Dr. Jimmye Norman

## 2019-10-28 NOTE — Assessment & Plan Note (Signed)
Patient has had his COVID vaccine and [facility] will bring records at next visit. Refused flu shot. Would like to discuss colonoscopy in 6 months was not interested in FIT test at this time.

## 2019-10-29 LAB — BMP8+ANION GAP
Anion Gap: 13 mmol/L (ref 10.0–18.0)
BUN/Creatinine Ratio: 20 (ref 10–24)
BUN: 14 mg/dL (ref 8–27)
CO2: 24 mmol/L (ref 20–29)
Calcium: 9.6 mg/dL (ref 8.6–10.2)
Chloride: 101 mmol/L (ref 96–106)
Creatinine, Ser: 0.71 mg/dL — ABNORMAL LOW (ref 0.76–1.27)
GFR calc Af Amer: 117 mL/min/{1.73_m2} (ref >59)
GFR calc non Af Amer: 101 mL/min/{1.73_m2} (ref >59)
Glucose: 116 mg/dL — ABNORMAL HIGH (ref 65–99)
Potassium: 4.8 mmol/L (ref 3.5–5.2)
Sodium: 138 mmol/L (ref 134–144)

## 2019-10-29 LAB — LIPID PANEL
Chol/HDL Ratio: 3.6 ratio (ref 0.0–5.0)
Cholesterol, Total: 105 mg/dL (ref 100–199)
HDL: 29 mg/dL — ABNORMAL LOW (ref >39)
LDL Chol Calc (NIH): 51 mg/dL (ref 0–99)
Triglycerides: 140 mg/dL (ref 0–149)
VLDL Cholesterol Cal: 25 mg/dL (ref 5–40)

## 2019-11-04 NOTE — Progress Notes (Signed)
Internal Medicine Clinic Attending  I saw and evaluated the patient.  I personally confirmed the key portions of the history and exam documented by Dr. Liang and I reviewed pertinent patient test results.  The assessment, diagnosis, and plan were formulated together and I agree with the documentation in the resident's note.  

## 2019-11-09 ENCOUNTER — Other Ambulatory Visit: Payer: Self-pay

## 2019-11-09 MED ORDER — LOSARTAN POTASSIUM 25 MG PO TABS: 25.0000 mg | ORAL_TABLET | Freq: Every day | ORAL | 3 refills | Status: DC

## 2019-11-14 NOTE — Progress Notes (Signed)
Cardiology Office Note   Date:  11/15/2019   ID:  Darius, [name] 07/12/1955, MRN [id number]  PCP:  Iona Beard, MD  Cardiologist:  Dr. Tamala Julian, MD   Chief Complaint  Patient presents with   Follow-up   History of Present Illness: Darius [name] is a 64 y.o. male who presents for 30-month follow-up, seen for Dr. Tamala Julian  Darius [name] has a history of CAD with a prior inferior STEMI treated with RCA DES and known residual LAD 70% stenosis. He had post stenting urologic bleeding on ASA and Brilinta and was eventually diagnosed with bladder cancer treated with resection 12/2017. He is currently on ASA and Plavix with stable hemoglobin. Also has a history of Behcet's disease, tobacco use, HLD and hypertension.  He was then seen by Dr. Tamala Julian 04/2018 and seemed to be doing well with no recurrent bleeding issues and no CV symptoms. On last visit with Darius Merle, NP 11/16/2018 he had no anginal symptoms however with get winded with exertion but continue to smoke.   He was then seen by myself 05/16/2019 in follow-up and was doing well from a CV standpoint.  He did have some very mild shortness of breath however this was unchanged and was present prior to his STEMI.  He saw his PCP on 10/28/2019 in follow-up for hypertension at which time his BP was well controlled at 121/66.  Today he presents for follow-up and reports he is doing well from a CV standpoint.  As above, continues to have mild dyspnea duration however reports he continues to smoke half a pack cigarettes per day.  He does state he is interested in quitting but this may be somewhat difficult as his wife is a heavy smoker and they both smoke indoors.  She has no intentions of quitting.  He denies chest pain, LE edema, orthopnea, PND, dizziness or syncope.  He is tolerating his medications well.  Recently saw his PCP with complete lab work.  LDL stable at 51.  Creatinine stable at 0.71.  Past Medical History:  Diagnosis Date    CAD (coronary artery disease)    Hypertension     Past Surgical History:  Procedure Laterality Date   CORONARY/GRAFT ACUTE MI REVASCULARIZATION N/A 11/30/2017   Procedure: Coronary/Graft Acute MI Revascularization;  Surgeon: Belva Crome, MD;  Location: [facility];  Service: Cardiovascular;  Laterality: N/A;   LEFT HEART CATH AND CORONARY ANGIOGRAPHY N/A 11/30/2017   Procedure: LEFT HEART CATH AND CORONARY ANGIOGRAPHY;  Surgeon: Belva Crome, MD;  Location: [facility];  Service: Cardiovascular;  Laterality: N/A;   TRANSURETHRAL RESECTION OF BLADDER TUMOR N/A 12/16/2017   Procedure: TRANSURETHRAL RESECTION OF BLADDER TUMOR (TURBT);  Surgeon: Franchot Gallo, MD;  Location: [facility];  Service: Urology;  Laterality: N/A;  90 MINS     Current Outpatient Medications  Medication Sig Dispense Refill   atorvastatin (LIPITOR) 80 MG tablet TAKE 1 TABLET BY MOUTH DAILY AT 6PM 90 tablet 1   clopidogrel (PLAVIX) 75 MG tablet Take 1 tablet (75 mg total) by mouth daily. 90 tablet 1   isosorbide mononitrate (IMDUR) 60 MG 24 hr tablet TAKE 1 TABLET BY MOUTH EVERY DAY 90 tablet 2   losartan (COZAAR) 25 MG tablet Take 1 tablet (25 mg total) by mouth daily. 90 tablet 3   nitroGLYCERIN (NITROSTAT) 0.4 MG SL tablet Place 0.4 mg under the tongue every 5 (five) minutes as needed for chest pain.  No current facility-administered medications for this visit.    Allergies:   Patient has no known allergies.    Social History:  The patient  reports that he has been smoking. He has been smoking about 0.50 packs per day. He has never used smokeless tobacco. He reports that he does not drink alcohol and does not use drugs.   Family History:  The patient's family history is not on file.    ROS:  Please see the history of present illness. Otherwise, review of systems are positive for none.   All other systems are reviewed and negative.    PHYSICAL EXAM: VS:  BP 122/86    Pulse 83     Ht 5\' 8"  (1.727 m)    Wt 245 lb 3.2 oz (111.2 kg)    SpO2 97%    BMI 37.28 kg/m  , BMI Body mass index is 37.28 kg/m.   General: Well developed, well nourished, NAD Neck: Negative for carotid bruits. No JVD Lungs:Clear to ausculation bilaterally. No wheezes, rales, or rhonchi. Breathing is unlabored. Cardiovascular: RRR with S1 S2. No murmurs Extremities: No edema.  Radial pulses 2+ bilaterally Neuro: Alert and oriented. No focal deficits. No facial asymmetry. MAE spontaneously. Psych: Responds to questions appropriately with normal affect.     EKG:  EKG is ordered today. The ekg ordered today demonstrates NSR with evidence of old inferior infarct and no acute changes, HR 83 bpm   Recent Labs: 10/28/2019: BUN 14; Creatinine, Ser 0.71; Potassium 4.8; Sodium 138    Lipid Panel    Component Value Date/Time   CHOL 105 10/28/2019 1434   TRIG 140 10/28/2019 1434   HDL 29 (L) 10/28/2019 1434   CHOLHDL 3.6 10/28/2019 1434   CHOLHDL 5.0 11/30/2017 0343   VLDL 15 11/30/2017 0343   LDLCALC 51 10/28/2019 1434     Wt Readings from Last 3 Encounters:  11/15/19 245 lb 3.2 oz (111.2 kg)  10/28/19 245 lb 12.8 oz (111.5 kg)  05/16/19 240 lb 12.8 oz (109.2 kg)     Other studies Reviewed: Additional studies/ records that were reviewed today include:  Review of the above records demonstrates:   [facility] Conclusions11/2019  - Left ventricle: The cavity size was normal. Wall thickness was increased in a pattern of moderate LVH. Systolic function was normal. The estimated ejection fraction was in the range of 60% to 65%. Inferobasal hypokinesis. The study is not technically sufficient to allow evaluation of LV diastolic function. - Mitral valve: Mildly thickened leaflets . There was trivial regurgitation. - Left atrium: The atrium was normal in size. - Inferior vena cava: The vessel was dilated. The respirophasic diameter changes were blunted (<50%), consistent with  elevated central venous pressure.   Coronary/Graft Acute MI Revascularization11/2019  LEFT HEART CATH AND CORONARY ANGIOGRAPHY  Conclusion    A stent was successfully placed.  A stent was successfully placed.   Inferior ST elevation myocardial infarction due to 100% occlusion of the proximal RCA. Successful therapy with proximal and distal vessel stenting reducing 100% and 80% stenoses to 10% and 0% respectively with improvement in TIMI flow from 0 to III. 22 x 4.0 mm Onyx stents were used with the proximal stent being postdilated to 4.5 mm in diameter.  Normal left main.  LAD contains segmental 60 to 70% mid vessel narrowing.  Large normal ramus intermedius.  Circumflex gives origin to 2 small obtuse marginal branches with a second marginal containing 60 to 70% diffuse disease in the mid vessel.  Inferobasal akinesis, overall LVEF greater than 60%. LVEDP 20.  Delayed reperfusion due to tortuous, angulated innominate/aortic arch anatomy.  Procedure complicated by Bezold-Jarisch response with profound hypotension and bradycardia requiring intravenous atropine and IV Levophed for heart rate and blood pressure support.  RECOMMENDATIONS:   IV fluid at a rate of 100 cc/h.  Wean Levophed once blood pressure is consistently above 100 mmHg.  Continue IV ReoPro for 8 hours.  Aspirin and Plavix dual antiplatelet therapy for 12 months.  High intensity statin therapy.  Institute beta-blocker therapy as tolerated by heart rate and blood pressure. Consider angiotensin converting enzyme inhibitor or angiotensin receptor blocker for blood pressure control if required. Patient has been on diuretic therapy as monotherapy for hypertension.  Smoking cessation.    ASSESSMENT AND PLAN:  1. CAD with prior PCI: -Denies chest pain today -ASA was stopped at last OV with continuation of Plavix>> tolerating well -BP and LDL within normal range  2. HLD: -Last LDL, 51 on  10/28/2019 -Continue atorvastatin -LDL goal <70  3. HTN: -Stable, 122/86 -Continue current regimen  4. Bladder cancer: -Follows closely with urology -No recurrent bleeding  5.  Ongoing tobacco use: -Cessation strongly encouraged -Patient asking about starting nicotine patches therefore will supply with instructions on taper   Current medicines are reviewed at length with the patient today.  The patient does not have concerns regarding medicines.  The following changes have been made: Add nicotine patch 14 mg x 6 weeks then reduce to 7 mg x 2 weeks then cessation  Labs/ tests ordered today include: None  No orders of the defined types were placed in this encounter.    Disposition:   FU with Dr. Tamala Julian in 6 months  Signed, Darius Drown, NP  11/15/2019 3:19 PM    [address], [place], [place]  [address] Phone: [phone]; Fax: [phone]

## 2019-11-15 ENCOUNTER — Encounter: Payer: Self-pay | Admitting: Cardiology

## 2019-11-15 ENCOUNTER — Other Ambulatory Visit: Payer: Self-pay

## 2019-11-15 ENCOUNTER — Ambulatory Visit (INDEPENDENT_AMBULATORY_CARE_PROVIDER_SITE_OTHER): Payer: Self-pay | Admitting: Cardiology

## 2019-11-15 VITALS — BP 122/86 | HR 83 | Ht 68.0 in | Wt 245.2 lb

## 2019-11-15 DIAGNOSIS — I10 Essential (primary) hypertension: Secondary | ICD-10-CM

## 2019-11-15 DIAGNOSIS — Z95820 Peripheral vascular angioplasty status with implants and grafts: Secondary | ICD-10-CM

## 2019-11-15 DIAGNOSIS — Z87891 Personal history of nicotine dependence: Secondary | ICD-10-CM

## 2019-11-15 DIAGNOSIS — R0600 Dyspnea, unspecified: Secondary | ICD-10-CM

## 2019-11-15 DIAGNOSIS — E782 Mixed hyperlipidemia: Secondary | ICD-10-CM

## 2019-11-15 DIAGNOSIS — I25118 Atherosclerotic heart disease of native coronary artery with other forms of angina pectoris: Secondary | ICD-10-CM

## 2019-11-15 MED ORDER — NICOTINE 7 MG/24HR TD PT24: MEDICATED_PATCH | TRANSDERMAL | 0 refills | Status: DC

## 2019-11-15 MED ORDER — NICOTINE 14 MG/24HR TD PT24: MEDICATED_PATCH | TRANSDERMAL | 1 refills | Status: DC

## 2019-11-15 NOTE — Patient Instructions (Signed)
Medication Instructions:  1.Start nicotine patches. You will use the 14 mg one patch daily for 6 weeks then reduce to the 7 mg patches, one patch daily for 2 weeks.  *If you need a refill on your cardiac medications before your next appointment, please call your pharmacy*   Lab Work: None If you have labs (blood work) drawn today and your tests are completely normal, you will receive your results only by: Marland Kitchen MyChart Message (if you have MyChart) OR . A paper copy in the mail If you have any lab test that is abnormal or we need to change your treatment, we will call you to review the results.   Testing/Procedures: Your physician has requested that you have an echocardiogram prior to 6 month follow up with Dr Tamala Julian. Echocardiography is a painless test that uses sound waves to create images of your heart. It provides your doctor with information about the size and shape of your heart and how well your heart's chambers and valves are working. This procedure takes approximately one hour. There are no restrictions for this procedure.     Follow-Up: At [facility], you and your health needs are our priority.  As part of our continuing mission to provide you with exceptional heart care, we have created designated Provider Care Teams.  These Care Teams include your primary Cardiologist (physician) and Advanced Practice Providers (APPs -  Physician Assistants and Nurse Practitioners) who all work together to provide you with the care you need, when you need it.  We recommend signing up for the patient portal called "MyChart".  Sign up information is provided on this After Visit Summary.  MyChart is used to connect with patients for Virtual Visits (Telemedicine).  Patients are able to view lab/test results, encounter notes, upcoming appointments, etc.  Non-urgent messages can be sent to your provider as well.   To learn more about what you can do with MyChart, go to [email or web address].    Your  next appointment:   6 month(s)  The format for your next appointment:   In Person  Provider:   Daneen Schick, MD

## 2020-02-28 ENCOUNTER — Telehealth: Payer: Self-pay | Admitting: Interventional Cardiology

## 2020-02-28 NOTE — Telephone Encounter (Signed)
   [facility] Pre-operative Risk Assessment    HEARTCARE STAFF: - Please ensure there is not already an duplicate clearance open for this procedure. - Under Visit Info/Reason for Call, type in Other and utilize the format Clearance MM/DD/YY or Clearance TBD. Do not use dashes or single digits. - If request is for dental extraction, please clarify the # of teeth to be extracted.  Request for surgical clearance:  1. What type of surgery is being performed? Bladder Tumor Removal   2. When is this surgery scheduled? TBD   3. What type of clearance is required (medical clearance vs. Pharmacy clearance to hold med vs. Both)? Both   4. Are there any medications that need to be held prior to surgery and how long? Plavix 1 week prior to surgery    5. Practice name and name of physician performing surgery? [facility], Dr. Diona Fanti  6. What is the office phone number? [phone]   7.   What is the office fax number? [phone]  8.   Anesthesia type (None, local, MAC, general) ? General    Trilby Drummer 02/28/2020, 1:40 PM  _________________________________________________________________   (provider comments below)

## 2020-02-29 NOTE — Telephone Encounter (Signed)
Pt with hx or prior MI and PCI w DES to RCA in 11/19.   Now on single antiplatelet Rx with Clopidogrel. Will fwd to Dr. Tamala Julian for any recommendations re: holding Plavix for upcoming surgery. Richardson Dopp, PA-C    02/29/2020 4:28 PM

## 2020-03-01 NOTE — Telephone Encounter (Signed)
Okay to hold plavix. 

## 2020-03-02 NOTE — Telephone Encounter (Signed)
   Primary Cardiologist: Sinclair Grooms, MD  Chart reviewed as part of pre-operative protocol coverage. Patient was contacted 03/02/2020 in reference to pre-operative risk assessment for pending surgery as outlined below.  Darius [name] was last seen on 11/15/19 by Kathyrn Drown, NP.  Since that day, Darius [name] has done well.  Per his primary cardiologist, Dr. Tamala Julian, he may hold Plavix 1 week prior to surgery (5-7 days).   Therefore, based on ACC/AHA guidelines, the patient would be at acceptable risk for the planned procedure without further cardiovascular testing.   The patient was advised that if he develops new symptoms prior to surgery to contact our office to arrange for a follow-up visit, and he verbalized understanding.  I will route this recommendation to the requesting party via Epic fax function and remove from pre-op pool. Please call with questions.  Loel Dubonnet, NP 03/02/2020, 9:09 AM

## 2020-03-02 NOTE — Telephone Encounter (Signed)
Called patient regarding clearance request. Left VM requesting call back.   Loel Dubonnet, NP

## 2020-03-06 ENCOUNTER — Other Ambulatory Visit: Payer: Self-pay | Admitting: Student

## 2020-03-06 DIAGNOSIS — I25118 Atherosclerotic heart disease of native coronary artery with other forms of angina pectoris: Secondary | ICD-10-CM

## 2020-03-07 ENCOUNTER — Other Ambulatory Visit: Payer: Self-pay | Admitting: Urology

## 2020-03-08 MED ORDER — GEMCITABINE CHEMO FOR BLADDER INSTILLATION 2000 MG
2000.0000 mg | Freq: Once | INTRAVENOUS | Status: DC
Start: 2020-03-08 — End: 2020-03-29

## 2020-03-23 ENCOUNTER — Other Ambulatory Visit: Payer: Self-pay

## 2020-03-23 ENCOUNTER — Encounter (HOSPITAL_BASED_OUTPATIENT_CLINIC_OR_DEPARTMENT_OTHER): Payer: Self-pay | Admitting: Urology

## 2020-03-23 NOTE — Progress Notes (Signed)
Spoke w/ via phone for pre-op interview--- PT Lab needs dos---- Istat              Lab results------ current ekg in epic/ chart COVID test ------ 03-27-2020 @ 1200 Arrive at ------- 0600 on 03-29-2020 NPO after MN NO Solid Food.  Clear liquids from MN until--- 0530 Med rec completed Medications to take morning of surgery ----- Imdur Diabetic medication ----- n/a Patient instructed to bring photo id and insurance card day of surgery Patient aware to have Driver (ride ) / caregiver    for 24 hours after surgery -- wife, [facility] Patient Special Instructions ----- n/a Pre-Op special Istructions ----- pt has telephone cardiac clearance by Laurann Montana NP dated 03-02-2020 in epic/ chart Patient verbalized understanding of instructions that were given at this phone interview. Patient denies shortness of breath, chest pain, fever, cough at this phone interview.   Anesthesia :  HTN;  CAD hx inferior STEMI 11-30-2017 s/p PCI with DES to RCA;  Pt denies cardiac s&s, no peripheral swelling, but does get sob w/ stairs but recovers quickly (which is usual for him).  Pt stated last taken nitro approx. > 6 months ago.  PCP:  Dr Idelle Jo (lov 12-28-2019 epic) Cardiologist : Dr Linard Millers (lov 11-15-2019 epic) Chest x-ray :  CT 01-18-2018 epic EKG : 11-15-2019 epic Echo : 11-30-2017 epic Stress test: no Cardiac Cath :  11-30-2017 epic Activity level:  See above Sleep Study/ CPAP : NO Blood Thinner/ Instructions Maryjane Hurter Dose: Plavix ASA / Instructions/ Last Dose : NO Per pt was given instructions from cardiologist to stop plavix one week prior to surgery, last dose 03-22-2020

## 2020-03-27 ENCOUNTER — Other Ambulatory Visit (HOSPITAL_COMMUNITY)
Admission: RE | Admit: 2020-03-27 | Discharge: 2020-03-27 | Disposition: A | Discharge status: Home / Self Care | Payer: Self-pay | Source: Ambulatory Visit | Attending: Urology | Admitting: Urology

## 2020-03-27 DIAGNOSIS — Z20822 Contact with and (suspected) exposure to covid-19: Secondary | ICD-10-CM | POA: Insufficient documentation

## 2020-03-27 DIAGNOSIS — Z01812 Encounter for preprocedural laboratory examination: Secondary | ICD-10-CM | POA: Insufficient documentation

## 2020-03-27 LAB — SARS CORONAVIRUS 2 (TAT 6-24 HRS): SARS Coronavirus 2: NEGATIVE

## 2020-03-28 NOTE — Anesthesia Preprocedure Evaluation (Addendum)
Anesthesia Evaluation  Patient identified by MRN, date of birth, ID band Patient awake    Reviewed: Allergy & Precautions, NPO status , Patient's Chart, lab work & pertinent test results  Airway Mallampati: II  TM Distance: >3 FB Neck ROM: Full    Dental  (+) Teeth Intact   Pulmonary shortness of breath and with exertion, Current Smoker and Patient abstained from smoking.,    Pulmonary exam normal        Cardiovascular hypertension, + CAD, + Past MI, + Cardiac Stents (11/19 on Plavix) and + DOE   Rhythm:Regular Rate:Normal     Neuro/Psych negative neurological ROS  negative psych ROS   GI/Hepatic negative GI ROS, Neg liver ROS,   Endo/Other  negative endocrine ROS  Renal/GU negative Renal ROS Bladder dysfunction  Bladder Ca    Musculoskeletal negative musculoskeletal ROS (+)   Abdominal (+)  Abdomen: soft. Bowel sounds: normal.  Peds  Hematology negative hematology ROS (+)   Anesthesia Other Findings   Reproductive/Obstetrics                            Anesthesia Physical Anesthesia Plan  ASA: III  Anesthesia Plan: General   Post-op Pain Management:    Induction: Intravenous  PONV Risk Score and Plan: 1 and Ondansetron, Dexamethasone and Treatment may vary due to age or medical condition  Airway Management Planned: Mask and LMA  Additional Equipment: None  Intra-op Plan:   Post-operative Plan: Extubation in OR  Informed Consent: I have reviewed the patients History and Physical, chart, labs and discussed the procedure including the risks, benefits and alternatives for the proposed anesthesia with the patient or authorized representative who has indicated his/her understanding and acceptance.     Dental advisory given  Plan Discussed with: CRNA  Anesthesia Plan Comments: (Lab Results      Component                Value               Date                      WBC                       13.8 (H)            09/29/2018                HGB                      16.3                03/29/2020                HCT                      48.0                03/29/2020                MCV                      91.6                09/29/2018                PLT  284                 09/29/2018           Lab Results      Component                Value               Date                      NA                       142                 03/29/2020                K                        4.2                 03/29/2020                CO2                      24                  10/28/2019                GLUCOSE                  144 (H)             03/29/2020                BUN                      9                   03/29/2020                CREATININE               0.60 (L)            03/29/2020                CALCIUM                  9.6                 10/28/2019                GFRNONAA                 101                 10/28/2019                GFRAA                    117                 10/28/2019           ECHO 11/19: Study Conclusions  - Left ventricle: The cavity size was normal. Wall thickness was  increased in a pattern of moderate LVH. Systolic function was  normal. The estimated ejection fraction was in the range of 60%  to 65%. Inferobasal hypokinesis. The  study is not technically  sufficient to allow evaluation of LV diastolic function.  - Mitral valve: Mildly thickened leaflets . There was trivial  regurgitation.  - Left atrium: The atrium was normal in size.  - Inferior vena cava: The vessel was dilated. The respirophasic  diameter changes were blunted (< 50%), consistent with elevated  central venous pressure.   Impressions:  - LVEF 60-65%, inferobasal hypokinesis, moderate LVH, trivial MR,  normal LA size, dilated IVC. )      Anesthesia Quick Evaluation

## 2020-03-29 ENCOUNTER — Encounter (HOSPITAL_BASED_OUTPATIENT_CLINIC_OR_DEPARTMENT_OTHER)
Admission: RE | Disposition: A | Discharge status: Home / Self Care | Payer: Self-pay | Source: Home / Self Care | Attending: Urology

## 2020-03-29 ENCOUNTER — Encounter (HOSPITAL_BASED_OUTPATIENT_CLINIC_OR_DEPARTMENT_OTHER): Payer: Self-pay | Admitting: Urology

## 2020-03-29 ENCOUNTER — Ambulatory Visit (HOSPITAL_BASED_OUTPATIENT_CLINIC_OR_DEPARTMENT_OTHER)
Admission: RE | Admit: 2020-03-29 | Discharge: 2020-03-29 | Disposition: A | Discharge status: Home / Self Care | Payer: Self-pay | Attending: Urology | Admitting: Urology

## 2020-03-29 ENCOUNTER — Ambulatory Visit (HOSPITAL_BASED_OUTPATIENT_CLINIC_OR_DEPARTMENT_OTHER): Payer: Self-pay | Admitting: Anesthesiology

## 2020-03-29 DIAGNOSIS — Z955 Presence of coronary angioplasty implant and graft: Secondary | ICD-10-CM | POA: Insufficient documentation

## 2020-03-29 DIAGNOSIS — C678 Malignant neoplasm of overlapping sites of bladder: Secondary | ICD-10-CM | POA: Insufficient documentation

## 2020-03-29 DIAGNOSIS — I252 Old myocardial infarction: Secondary | ICD-10-CM | POA: Insufficient documentation

## 2020-03-29 DIAGNOSIS — F17210 Nicotine dependence, cigarettes, uncomplicated: Secondary | ICD-10-CM | POA: Insufficient documentation

## 2020-03-29 DIAGNOSIS — Z8616 Personal history of COVID-19: Secondary | ICD-10-CM | POA: Insufficient documentation

## 2020-03-29 HISTORY — PX: TRANSURETHRAL RESECTION OF BLADDER TUMOR WITH MITOMYCIN-C: SHX6459

## 2020-03-29 HISTORY — DX: Presence of spectacles and contact lenses: Z97.3

## 2020-03-29 HISTORY — DX: Behcet's disease: M35.2

## 2020-03-29 HISTORY — DX: Other forms of dyspnea: R06.09

## 2020-03-29 HISTORY — DX: Malignant neoplasm of bladder, unspecified: C67.9

## 2020-03-29 HISTORY — DX: Nocturia: R35.1

## 2020-03-29 HISTORY — DX: Dyspnea, unspecified: R06.00

## 2020-03-29 LAB — POCT I-STAT, CHEM 8
BUN: 9 mg/dL (ref 8–23)
Calcium, Ion: 1.34 mmol/L (ref 1.15–1.40)
Chloride: 103 mmol/L (ref 98–111)
Creatinine, Ser: 0.6 mg/dL — ABNORMAL LOW (ref 0.61–1.24)
Glucose, Bld: 144 mg/dL — ABNORMAL HIGH (ref 70–99)
HCT: 48 % (ref 39.0–52.0)
Hemoglobin: 16.3 g/dL (ref 13.0–17.0)
Potassium: 4.2 mmol/L (ref 3.5–5.1)
Sodium: 142 mmol/L (ref 135–145)
TCO2: 26 mmol/L (ref 22–32)

## 2020-03-29 SURGERY — TRANSURETHRAL RESECTION OF BLADDER TUMOR WITH MITOMYCIN-C
Anesthesia: General | Site: Bladder

## 2020-03-29 MED ORDER — PROPOFOL 10 MG/ML IV BOLUS
INTRAVENOUS | Status: DC | PRN
  Administered 2020-03-29: 180 mg via INTRAVENOUS

## 2020-03-29 MED ORDER — ONDANSETRON HCL 4 MG/2ML IJ SOLN
INTRAMUSCULAR | Status: AC
  Filled 2020-03-29: qty 2

## 2020-03-29 MED ORDER — PROPOFOL 10 MG/ML IV BOLUS
INTRAVENOUS | Status: AC
  Filled 2020-03-29: qty 20

## 2020-03-29 MED ORDER — LIDOCAINE 2% (20 MG/ML) 5 ML SYRINGE
INTRAMUSCULAR | Status: DC | PRN
  Administered 2020-03-29: 80 mg via INTRAVENOUS

## 2020-03-29 MED ORDER — ONDANSETRON HCL 4 MG/2ML IJ SOLN
INTRAMUSCULAR | Status: DC | PRN
  Administered 2020-03-29: 4 mg via INTRAVENOUS

## 2020-03-29 MED ORDER — TRAMADOL HCL 50 MG PO TABS: 50.0000 mg | ORAL_TABLET | Freq: Four times a day (QID) | ORAL | 0 refills | Status: AC | PRN

## 2020-03-29 MED ORDER — FENTANYL CITRATE (PF) 100 MCG/2ML IJ SOLN
INTRAMUSCULAR | Status: AC
  Filled 2020-03-29: qty 2

## 2020-03-29 MED ORDER — ACETAMINOPHEN 10 MG/ML IV SOLN: 1000.0000 mg | Freq: Once | INTRAVENOUS | Status: DC | PRN

## 2020-03-29 MED ORDER — PROMETHAZINE HCL 25 MG/ML IJ SOLN: 6.2500 mg | INTRAMUSCULAR | Status: DC | PRN

## 2020-03-29 MED ORDER — MIDAZOLAM HCL 2 MG/2ML IJ SOLN
INTRAMUSCULAR | Status: AC
  Filled 2020-03-29: qty 2

## 2020-03-29 MED ORDER — LIDOCAINE 2% (20 MG/ML) 5 ML SYRINGE
INTRAMUSCULAR | Status: AC
  Filled 2020-03-29: qty 5

## 2020-03-29 MED ORDER — FENTANYL CITRATE (PF) 100 MCG/2ML IJ SOLN
INTRAMUSCULAR | Status: DC | PRN
  Administered 2020-03-29 (×2): 50 ug via INTRAVENOUS

## 2020-03-29 MED ORDER — CEPHALEXIN 500 MG PO CAPS: 500.0000 mg | ORAL_CAPSULE | Freq: Two times a day (BID) | ORAL | 0 refills | Status: AC

## 2020-03-29 MED ORDER — GEMCITABINE CHEMO FOR BLADDER INSTILLATION 2000 MG
2000.0000 mg | Freq: Once | INTRAVENOUS | Status: AC
  Administered 2020-03-29: 2000 mg via INTRAVESICAL
  Filled 2020-03-29: qty 2000

## 2020-03-29 MED ORDER — MIDAZOLAM HCL 2 MG/2ML IJ SOLN
INTRAMUSCULAR | Status: DC | PRN
  Administered 2020-03-29: 2 mg via INTRAVENOUS

## 2020-03-29 MED ORDER — LACTATED RINGERS IV SOLN: INTRAVENOUS | Status: DC

## 2020-03-29 MED ORDER — DEXAMETHASONE SODIUM PHOSPHATE 10 MG/ML IJ SOLN
INTRAMUSCULAR | Status: DC | PRN
  Administered 2020-03-29: 10 mg via INTRAVENOUS

## 2020-03-29 MED ORDER — FENTANYL CITRATE (PF) 100 MCG/2ML IJ SOLN: 25.0000 ug | INTRAMUSCULAR | Status: DC | PRN

## 2020-03-29 MED ORDER — ARTIFICIAL TEARS OPHTHALMIC OINT
TOPICAL_OINTMENT | OPHTHALMIC | Status: AC
  Filled 2020-03-29: qty 3.5

## 2020-03-29 MED ORDER — CEFAZOLIN SODIUM-DEXTROSE 2-4 GM/100ML-% IV SOLN
2.0000 g | INTRAVENOUS | Status: AC
  Administered 2020-03-29: 2 g via INTRAVENOUS

## 2020-03-29 MED ORDER — SODIUM CHLORIDE 0.9 % IR SOLN
Status: DC | PRN
  Administered 2020-03-29: 4000 mL

## 2020-03-29 MED ORDER — DEXAMETHASONE SODIUM PHOSPHATE 10 MG/ML IJ SOLN
INTRAMUSCULAR | Status: AC
  Filled 2020-03-29: qty 1

## 2020-03-29 MED ORDER — CEFAZOLIN SODIUM-DEXTROSE 2-4 GM/100ML-% IV SOLN
INTRAVENOUS | Status: AC
  Filled 2020-03-29: qty 100

## 2020-03-29 SURGICAL SUPPLY — 29 items
BAG DRAIN URO-CYSTO SKYTR STRL (DRAIN) ×2 IMPLANT
BAG DRN RND TRDRP ANRFLXCHMBR (UROLOGICAL SUPPLIES) ×1
BAG DRN UROCATH (DRAIN) ×1
BAG URINE DRAIN 2000ML AR STRL (UROLOGICAL SUPPLIES) ×2 IMPLANT
BAG URINE LEG 500ML (DRAIN) IMPLANT
CATH FOLEY 2WAY SLVR  5CC 20FR (CATHETERS) ×2
CATH FOLEY 2WAY SLVR  5CC 22FR (CATHETERS)
CATH FOLEY 2WAY SLVR 5CC 20FR (CATHETERS) IMPLANT
CATH FOLEY 2WAY SLVR 5CC 22FR (CATHETERS) IMPLANT
CATH FOLEY 3WAY 30CC 22FR (CATHETERS) IMPLANT
CLOTH BEACON ORANGE TIMEOUT ST (SAFETY) ×2 IMPLANT
ELECT REM PT RETURN 9FT ADLT (ELECTROSURGICAL) ×2
ELECTRODE REM PT RTRN 9FT ADLT (ELECTROSURGICAL) ×1 IMPLANT
EVACUATOR MICROVAS BLADDER (UROLOGICAL SUPPLIES) IMPLANT
GLOVE SURG ENC MOIS LTX SZ8 (GLOVE) ×2 IMPLANT
GOWN STRL REUS W/TWL XL LVL3 (GOWN DISPOSABLE) ×2 IMPLANT
HOLDER FOLEY CATH W/STRAP (MISCELLANEOUS) IMPLANT
IV NS IRRIG 3000ML ARTHROMATIC (IV SOLUTION) ×4 IMPLANT
KIT TURNOVER CYSTO (KITS) ×2 IMPLANT
LOOP CUT BIPOLAR 24F LRG (ELECTROSURGICAL) ×2 IMPLANT
MANIFOLD NEPTUNE II (INSTRUMENTS) ×2 IMPLANT
NS IRRIG 500ML POUR BTL (IV SOLUTION) IMPLANT
PACK CYSTO (CUSTOM PROCEDURE TRAY) ×2 IMPLANT
PLUG CATH AND CAP STER (CATHETERS) IMPLANT
SYR TOOMEY IRRIG 70ML (MISCELLANEOUS) ×2
SYRINGE TOOMEY IRRIG 70ML (MISCELLANEOUS) ×1 IMPLANT
TUBE CONNECTING 12X1/4 (SUCTIONS) ×2 IMPLANT
TUBING UROLOGY SET (TUBING) IMPLANT
WATER STERILE IRR 500ML POUR (IV SOLUTION) ×2 IMPLANT

## 2020-03-29 NOTE — Transfer of Care (Signed)
Immediate Anesthesia Transfer of Care Note  Patient: Dashiel Elnajjar  Procedure(s) Performed: Procedure(s) (LRB): TRANSURETHRAL RESECTION OF BLADDER TUMOR WITH POST OPERATIVE INSTILLATION OF GEMCITABINE (N/A)  Patient Location: PACU  Anesthesia Type: General  Level of Consciousness: awake, alert  and oriented  Airway & Oxygen Therapy: Patient Spontanous Breathing and Patient connected to nasal cannula oxygen  Post-op Assessment: Report given to PACU RN and Post -op Vital signs reviewed and stable  Post vital signs: Reviewed and stable  Complications: No apparent anesthesia complicationsLast Vitals:  Vitals Value Taken Time  BP    Temp    Pulse 74 03/29/20 0851  Resp 12 03/29/20 0851  SpO2 95 % 03/29/20 0851  Vitals shown include unvalidated device data.  Last Pain:  Vitals:   03/29/20 0639  TempSrc: Oral  PainSc: 0-No pain      Patients Stated Pain Goal: 5 (71/95/97 [id number])  Complications: No complications documented.

## 2020-03-29 NOTE — Interval H&P Note (Signed)
History and Physical Interval Note:  03/29/2020 8:01 AM  Darius [name]  has presented today for surgery, with the diagnosis of BLADDER CANCER.  The various methods of treatment have been discussed with the patient and family. After consideration of risks, benefits and other options for treatment, the patient has consented to  Procedure(s) with comments: TRANSURETHRAL RESECTION OF BLADDER TUMOR WITH GEMCITABINE (N/A) - 30 MINS as a surgical intervention.  The patient's history has been reviewed, patient examined, no change in status, stable for surgery.  I have reviewed the patient's chart and labs.  Questions were answered to the patient's satisfaction.     Lillette Boxer Chukwudi Ewen

## 2020-03-29 NOTE — Discharge Instructions (Signed)
  Post Anesthesia Home Care Instructions  Activity: Get plenty of rest for the remainder of the day. A responsible adult should stay with you for 24 hours following the procedure.  For the next 24 hours, DO NOT: -Drive a car -Paediatric nurse -Drink alcoholic beverages -Take any medication unless instructed by your physician -Make any legal decisions or sign important papers.  Meals: Start with liquid foods such as gelatin or soup. Progress to regular foods as tolerated. Avoid greasy, spicy, heavy foods. If nausea and/or vomiting occur, drink only clear liquids until the nausea and/or vomiting subsides. Call your physician if vomiting continues.  Special Instructions/Symptoms: Your throat may feel dry or sore from the anesthesia or the breathing tube placed in your throat during surgery. If this causes discomfort, gargle with warm salt water. The discomfort should disappear within 24 hours.     1. You may see some blood in the urine and may have some burning with urination for 48-72 hours. You also may notice that you have to urinate more frequently or urgently after your procedure which is normal.  2. You should call should you develop an inability urinate, fever > 101, persistent nausea and vomiting that prevents you from eating or drinking to stay hydrated.  3. If you have a catheter, you will be taught how to take care of the catheter by the nursing staff prior to discharge from the hospital.  You may periodically feel a strong urge to void with the catheter in place.  This is a bladder spasm and most often can occur when having a bowel movement or moving around. It is typically self-limited and usually will stop after a few minutes.  You may use some Vaseline or Neosporin around the tip of the catheter to reduce friction at the tip of the penis. You may also see some blood in the urine.  A very small amount of blood can make the urine look quite red.  As long as the catheter is draining  well, there usually is not a problem.  However, if the catheter is not draining well and is bloody, you should call the office [phone]) to notify us.  It is okay to take the catheter out on Friday morning as directed by the nurses if you do not have too much blood in your urine. 4. You can restart your Plavix on Friday if your urine is not bloody.

## 2020-03-29 NOTE — H&P (Signed)
H&P  Chief Complaint: Bladder cancer  History of Present Illness: 65 yo male presents for TURBT/gemcitabine administration for recurrent TCCa of bladder.  Past Medical History:  Diagnosis Date  . Behcet's disease ([place])    per cardiologist note  . Bladder cancer [facility])    urologist--- dr Diona Fanti---- first dx 12/ 2019 s/p turbt  . CAD (coronary artery disease) cardiologist--- dr h. smith   hx inferior STEMI  11-30-2017  s/p  PCI with DES to proxRCA;  normal echo 11-30-2017 in epic  . DOE (dyspnea on exertion)    per pt a little sob w/ stairs but recovers quickly, stated this is usual  . History of 2019 novel coronavirus disease (COVID-19) 09/2019   per pt had positive home test approx. 09/ 2021 with mild symptoms that resolved  . History of ST elevation myocardial infarction (STEMI) 11/30/2017   s/p  pci w/ des  . Hypertension    followed by pcp  . Nocturia   . S/P drug eluting coronary stent placement 11/30/2017   DES x1 to proxRCA  . Wears contact lenses     Past Surgical History:  Procedure Laterality Date  . CORONARY/GRAFT ACUTE MI REVASCULARIZATION N/A 11/30/2017   Procedure: Coronary/Graft Acute MI Revascularization;  Surgeon: Belva Crome, MD;  Location: [facility];  Service: Cardiovascular;  Laterality: N/A;  . LEFT HEART CATH AND CORONARY ANGIOGRAPHY N/A 11/30/2017   Procedure: LEFT HEART CATH AND CORONARY ANGIOGRAPHY;  Surgeon: Belva Crome, MD;  Location: [facility];  Service: Cardiovascular;  Laterality: N/A;  . TRANSURETHRAL RESECTION OF BLADDER TUMOR N/A 12/16/2017   Procedure: TRANSURETHRAL RESECTION OF BLADDER TUMOR (TURBT);  Surgeon: Franchot Gallo, MD;  Location: [facility];  Service: Urology;  Laterality: N/A;  90 MINS    Home Medications:  Allergies as of 03/29/2020   No Known Allergies     Medication List    Notice   Cannot display discharge medications because the patient has not yet been admitted.     Allergies: No Known  Allergies  History reviewed. No pertinent family history.  Social History:  reports that he has been smoking cigarettes. He has smoked for the past 45.00 years. He has never used smokeless tobacco. He reports that he does not drink alcohol and does not use drugs.  ROS: A complete review of systems was performed.  All systems are negative except for pertinent findings as noted.  Physical Exam:  Vital signs in last 24 hours: Ht 5\' 8"  (1.727 m)   Wt 108.9 kg   BMI 36.49 kg/m  Constitutional:  Alert and oriented, No acute distress Cardiovascular: Regular rate  Respiratory: Normal respiratory effort GI: Abdomen is soft, nontender, nondistended, no abdominal masses. No CVAT.  Genitourinary: Normal male phallus, testes are descended bilaterally and non-tender and without masses, scrotum is normal in appearance without lesions or masses, perineum is normal on inspection. Lymphatic: No lymphadenopathy Neurologic: Grossly intact, no focal deficits Psychiatric: Normal mood and affect  Laboratory Data:  No results for input(s): WBC, HGB, HCT, PLT in the last 72 hours.  No results for input(s): NA, K, CL, GLUCOSE, BUN, CALCIUM, CREATININE in the last 72 hours.  Invalid input(s): CO3   No results found for this or any previous visit (from the past 24 hour(s)). Recent Results (from the past 240 hour(s))  SARS CORONAVIRUS 2 (TAT 6-24 HRS) Nasopharyngeal Nasopharyngeal Swab     Status: None   Collection Time: 03/27/20 12:02 PM   Specimen:  Nasopharyngeal Swab  Result Value Ref Range Status   SARS Coronavirus 2 NEGATIVE NEGATIVE Final    Comment: (NOTE) SARS-CoV-2 target nucleic acids are NOT DETECTED.  The SARS-CoV-2 RNA is generally detectable in upper and lower respiratory specimens during the acute phase of infection. Negative results do not preclude SARS-CoV-2 infection, do not rule out co-infections with other pathogens, and should not be used as the sole basis for treatment or other  patient management decisions. Negative results must be combined with clinical observations, patient history, and epidemiological information. The expected result is Negative.  Fact Sheet for Patients: SugarRoll.be  Fact Sheet for Healthcare Providers: https://www.woods-mathews.com/  This test is not yet approved or cleared by the [place] FDA and  has been authorized for detection and/or diagnosis of SARS-CoV-2 by FDA under an Emergency Use Authorization (EUA). This EUA will remain  in effect (meaning this test can be used) for the duration of the COVID-19 declaration under Se ction 564(b)(1) of the Act, 21 U.S.C. section 360bbb-3(b)(1), unless the authorization is terminated or revoked sooner.  Performed at [facility], [place] [address] [place], [place] [address]     Renal Function: No results for input(s): CREATININE in the last 168 hours. CrCl cannot be calculated (Patient's most recent lab result is older than the maximum 21 days allowed.).  Radiologic Imaging: No results found.  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

## 2020-03-29 NOTE — Anesthesia Procedure Notes (Signed)
Procedure Name: LMA Insertion Date/Time: 03/29/2020 8:13 AM Performed by: Mechele Claude, CRNA Pre-anesthesia Checklist: Patient identified, Emergency Drugs available, Suction available and Patient being monitored Patient Re-evaluated:Patient Re-evaluated prior to induction Oxygen Delivery Method: Circle system utilized Preoxygenation: Pre-oxygenation with 100% oxygen Induction Type: IV induction Ventilation: Mask ventilation without difficulty LMA: LMA inserted LMA Size: 4.0 Number of attempts: 1 Airway Equipment and Method: Bite block Placement Confirmation: positive ETCO2 Tube secured with: Tape Dental Injury: Teeth and Oropharynx as per pre-operative assessment

## 2020-03-29 NOTE — Anesthesia Postprocedure Evaluation (Signed)
Anesthesia Post Note  Patient: Darius [name]  Procedure(s) Performed: TRANSURETHRAL RESECTION OF BLADDER TUMOR WITH POST OPERATIVE INSTILLATION OF GEMCITABINE (N/A Bladder)     Patient location during evaluation: PACU Anesthesia Type: General Level of consciousness: awake and alert Pain management: pain level controlled Vital Signs Assessment: post-procedure vital signs reviewed and stable Respiratory status: spontaneous breathing, nonlabored ventilation, respiratory function stable and patient connected to nasal cannula oxygen Cardiovascular status: blood pressure returned to baseline and stable Postop Assessment: no apparent nausea or vomiting Anesthetic complications: no   No complications documented.  Last Vitals:  Vitals:   03/29/20 1026 03/29/20 1051  BP:  128/80  Pulse:  (!) 55  Resp:  16  Temp: 36.9 C   SpO2:  92%    Last Pain:  Vitals:   03/29/20 1051  TempSrc:   PainSc: 0-No pain                 Belenda Cruise P Quintan Saldivar

## 2020-03-29 NOTE — Op Note (Signed)
Preoperative diagnosis: Recurrent urothelial carcinoma the bladder  Postoperative diagnosis: Same  Principal procedure: Cystoscopy, transurethral resection of bladder tumor (2 cm), placement of intravesical gemcitabine  Surgeon: Dahlstedt  Anesthesia: General with LMA  Complications none  Estimated blood loss: None  Specimen: Bladder tumor, to pathology  Drains: 28 French Foley catheter  Indications: 65 year old male with recurrent urothelial carcinoma the bladder.  His first TURBT was in 2019, shortly after a myocardial infarction with stent placement.  At that time he was on antiplatelet therapy which precluded use of postoperative intravesical chemotherapy due to need for adequate drainage of his bladder.  He was recently in the office with cystoscopy revealing 2-3 areas of recurrence of papillary lesions, mainly on the right posterior and trigonal region.  He presents at this time for repeat TURBT plus placement of gentamicin postoperatively.  He is aware of the risks, complications and expected outcomes of the procedure and desires to proceed.  Description of procedure: The patient was properly identified in the holding area.  He received preoperative IV antibiotics.  Was taken to the operating room where general anesthetic was administered with the LMA.  He was placed in the dorsolithotomy position.  Genitalia and perineum were prepped, draped, proper timeout performed.  30 French resectoscope sheath was passed using a visual obturator.  There was a small papillary recurrence right at the bladder neck posteriorly at the 6 o'clock position.  Additionally, papillary recurrences were seen just around the right ureteral orifice and in the right posterior bladder wall.  The largest diameter of any of these lesions was 2 cm.  First, the bladder neck recurrence was resected.  Secondly, papillary lesions around the right ureteral orifice were resected, down into the muscular layer.  I took great  caution to avoid injury to the ureteral orifice which was very close to these lesions.  The last lesions, in the right posterior bladder wall, were resected, again into the muscular layer.  There was no significant perforation seen.  All resected sites were carefully coagulated for hemostasis.  No bleeding was seen at this point.  All tissue fragments were irrigated from the bladder with a To[place]y syringe and sent for permanent specimen labeled "bladder tumor".  Repeat inspection revealed no bleeding, no further lesions were seen, and the procedure was terminated.  The scope was removed, bladder drained, 20 French Foley catheter placed in the bladder.  It was plugged.  At this point, the procedure was terminated.  Patient was awakened and taken to the PACU in stable condition, having tolerated the procedure well.  In the PACU, 2 g of gemcitabine was placed in the bladder, left indwelling for an hour and then drained.

## 2020-03-30 ENCOUNTER — Encounter (HOSPITAL_BASED_OUTPATIENT_CLINIC_OR_DEPARTMENT_OTHER): Payer: Self-pay | Admitting: Urology

## 2020-03-30 LAB — SURGICAL PATHOLOGY

## 2020-04-11 ENCOUNTER — Encounter (HOSPITAL_BASED_OUTPATIENT_CLINIC_OR_DEPARTMENT_OTHER): Payer: Self-pay | Admitting: Urology

## 2020-04-11 NOTE — Addendum Note (Signed)
Addendum  created 04/11/20 1053 by Kaydn Kumpf, March Rummage, DO   Intraprocedure Event edited, Intraprocedure Meds edited, Intraprocedure Staff edited

## 2020-04-20 ENCOUNTER — Other Ambulatory Visit: Payer: Self-pay | Admitting: Student

## 2020-04-20 DIAGNOSIS — I25118 Atherosclerotic heart disease of native coronary artery with other forms of angina pectoris: Secondary | ICD-10-CM

## 2020-05-03 ENCOUNTER — Other Ambulatory Visit: Payer: Self-pay

## 2020-05-03 ENCOUNTER — Ambulatory Visit (HOSPITAL_COMMUNITY): Payer: Self-pay | Attending: Cardiology

## 2020-05-03 DIAGNOSIS — R0600 Dyspnea, unspecified: Secondary | ICD-10-CM | POA: Insufficient documentation

## 2020-05-03 LAB — ECHOCARDIOGRAM COMPLETE
Area-P 1/2: 2.05 cm2
S' Lateral: 3 cm

## 2020-07-12 NOTE — Progress Notes (Signed)
Cardiology Office Note   Date:  07/18/2020   ID:  Darius [name], DOB 02/15/1955, MRN [id number]  PCP:  Iona Beard, MD  Cardiologist: Dr. Tamala Julian, MD   Chief Complaint  Patient presents with   Fatigue   Follow-up     History of Present Illness: Darius [name] is a 65 y.o. male who presents for  who presents for 66-month follow-up, seen for Dr. Tamala Julian   [name] has a history of CAD with a prior inferior STEMI treated with RCA DES and known residual LAD 70% stenosis. He had post stenting urologic bleeding on ASA and Brilinta and was eventually diagnosed with bladder cancer treated with resection 12/2017. He is currently on ASA and Plavix with stable hemoglobin. Also has a history of Behcet's disease, tobacco use, HLD and hypertension.   He was then seen by Dr. Tamala Julian 04/2018 and seemed to be doing well with no recurrent bleeding issues and no CV symptoms. On last visit with Truitt Merle, NP 11/16/2018 he had no anginal symptoms however with get winded with exertion but continue to smoke.   He was then seen by myself 05/16/2019 in follow-up and was doing well from a CV standpoint.  He did have some very mild shortness of breath however this was unchanged and was present prior to his STEMI.   He saw his PCP on 10/28/2019 in follow-up for hypertension at which time his BP was well controlled at 121/66.   On our last appointment he continued to have mild dyspnea however continued to smoke half a pack cigarettes per day.  Plan  was to follow with Dr. Tamala Julian and update echo prior to OV. Echo showed an increase in aortic root dilation from 59mm to 32mm therefore plan was to proceed with CTA however this was never performed.    Today he presents for follow up and initially reports that he is doing well however appears tired. BP on initial evaluation low at 96/58 with HR 102. On my re check this was improved to 110/78 with a HR at 76bpm. He denies chest pain, SOB, LE edema, palpitations, recent  illness with fever, chills, cough or myalgias. On further questioning, he states that he has noticed increased fatigue with activities which used to be easy for him. We discussed further workup with stress testing. He agrees. He is tolerating his medications well with no other complaints.   Past Medical History:  Diagnosis Date   Behcet's disease [facility])    per cardiologist note   Bladder cancer [facility])    urologist--- dr Diona Fanti---- first dx 12/ 2019 s/p turbt   CAD (coronary artery disease) cardiologist--- dr h. Tamala Julian   hx inferior STEMI  11-30-2017  s/p  PCI with DES to proxRCA;  normal echo 11-30-2017 in epic   DOE (dyspnea on exertion)    per pt a little sob w/ stairs but recovers quickly, stated this is usual   History of 2019 novel coronavirus disease (COVID-19) 09/2019   per pt had positive home test approx. 09/ 2021 with mild symptoms that resolved   History of ST elevation myocardial infarction (STEMI) 11/30/2017   s/p  pci w/ des   Hypertension    followed by pcp   Nocturia    S/P drug eluting coronary stent placement 11/30/2017   DES x1 to proxRCA   Wears contact lenses     Past Surgical History:  Procedure Laterality Date   CORONARY/GRAFT ACUTE MI REVASCULARIZATION N/A 11/30/2017   Procedure:  Coronary/Graft Acute MI Revascularization;  Surgeon: Belva Crome, MD;  Location: [place] [facility];  Service: Cardiovascular;  Laterality: N/A;   LEFT HEART CATH AND CORONARY ANGIOGRAPHY N/A 11/30/2017   Procedure: LEFT HEART CATH AND CORONARY ANGIOGRAPHY;  Surgeon: Belva Crome, MD;  Location: [place] [facility];  Service: Cardiovascular;  Laterality: N/A;   TRANSURETHRAL RESECTION OF BLADDER TUMOR N/A 12/16/2017   Procedure: TRANSURETHRAL RESECTION OF BLADDER TUMOR (TURBT);  Surgeon: Franchot Gallo, MD;  Location: [facility];  Service: Urology;  Laterality: N/A;  90 MINS   TRANSURETHRAL RESECTION OF BLADDER TUMOR WITH MITOMYCIN-C N/A 03/29/2020   Procedure: TRANSURETHRAL  RESECTION OF BLADDER TUMOR WITH POST OPERATIVE INSTILLATION OF GEMCITABINE;  Surgeon: Franchot Gallo, MD;  Location: [facility];  Service: Urology;  Laterality: N/A;  30 MINS     Current Outpatient Medications  Medication Sig Dispense Refill   atorvastatin (LIPITOR) 80 MG tablet TAKE 1 TABLET BY MOUTH DAILY AT 6PM (Patient taking differently: Take 80 mg by mouth every evening. TAKE 1 TABLET BY MOUTH DAILY AT 6PM) 90 tablet 1   clopidogrel (PLAVIX) 75 MG tablet TAKE 1 TABLET BY MOUTH EVERY DAY 90 tablet 1   isosorbide mononitrate (IMDUR) 60 MG 24 hr tablet TAKE 1 TABLET BY MOUTH EVERY DAY (Patient taking differently: Take 60 mg by mouth daily.) 90 tablet 2   losartan (COZAAR) 25 MG tablet Take 0.5 tablets (12.5 mg total) by mouth daily. 45 tablet 3   nitroGLYCERIN (NITROSTAT) 0.4 MG SL tablet Place 0.4 mg under the tongue every 5 (five) minutes as needed for chest pain.     traMADol (ULTRAM) 50 MG tablet Take 1 tablet (50 mg total) by mouth every 6 (six) hours as needed for moderate pain. 10 tablet 0   No current facility-administered medications for this visit.    Allergies:   Patient has no known allergies.    Social History:  The patient  reports that he has been smoking cigarettes. He has never used smokeless tobacco. He reports that he does not drink alcohol and does not use drugs.   Family History:  The patient's family history is not on file.    ROS:  Please see the history of present illness. Otherwise, review of systems are positive for none.   All other systems are reviewed and negative.    PHYSICAL EXAM: VS:  BP 110/78   Pulse 72   Ht 5\' 8"  (1.727 m)   Wt 227 lb (103 kg)   BMI 34.52 kg/m  , BMI Body mass index is 34.52 kg/m.  General: Well developed, well nourished, NAD Neck: Negative for carotid bruits. No JVD Lungs:Clear to ausculation bilaterally. No wheezes. Breathing is unlabored. Cardiovascular: RRR with S1 S2. No murmurs Extremities: No  edema. Neuro: Alert and oriented. No focal deficits. No facial asymmetry. MAE spontaneously. Psych: Responds to questions appropriately with normal affect.     EKG:  EKG is not ordered today.   Recent Labs: 03/29/2020: BUN 9; Creatinine, Ser 0.60; Hemoglobin 16.3; Potassium 4.2; Sodium 142    Lipid Panel    Component Value Date/Time   CHOL 105 10/28/2019 1434   TRIG 140 10/28/2019 1434   HDL 29 (L) 10/28/2019 1434   CHOLHDL 3.6 10/28/2019 1434   CHOLHDL 5.0 11/30/2017 0343   VLDL 15 11/30/2017 0343   LDLCALC 51 10/28/2019 1434     Wt Readings from Last 3 Encounters:  07/18/20 227 lb (103 kg)  03/29/20 230 lb 6.4  oz (104.5 kg)  11/15/19 245 lb 3.2 oz (111.2 kg)     Other studies Reviewed: Additional studies/ records that were reviewed today include: . Review of the above records demonstrates:    ECHO Study Conclusions 11/2017   - Left ventricle: The cavity size was normal. Wall thickness was   increased in a pattern of moderate LVH. Systolic function was   normal. The estimated ejection fraction was in the range of 60%   to 65%. Inferobasal hypokinesis. The study is not technically   sufficient to allow evaluation of LV diastolic function. - Mitral valve: Mildly thickened leaflets . There was trivial   regurgitation. - Left atrium: The atrium was normal in size. - Inferior vena cava: The vessel was dilated. The respirophasic   diameter changes were blunted (< 50%), consistent with elevated   central venous pressure.     Coronary/Graft Acute MI Revascularization 11/2017  LEFT HEART CATH AND CORONARY ANGIOGRAPHY  Conclusion     A stent was successfully placed. A stent was successfully placed.   Inferior ST elevation myocardial infarction due to 100% occlusion of the proximal RCA.  Successful therapy with proximal and distal vessel stenting reducing 100% and 80% stenoses to 10% and 0% respectively with improvement in TIMI flow from 0 to III.  22 x 4.0 mm Onyx  stents were used with the proximal stent being postdilated to 4.5 mm in diameter. Normal left main. LAD contains segmental 60 to 70% mid vessel narrowing. Large normal ramus intermedius. Circumflex gives origin to 2 small obtuse marginal branches with a second marginal containing 60 to 70% diffuse disease in the mid vessel. Inferobasal akinesis, overall LVEF greater than 60%.  LVEDP 20. Delayed reperfusion due to tortuous, angulated innominate/aortic arch anatomy. Procedure complicated by Bezold-Jarisch response with profound hypotension and bradycardia requiring intravenous atropine and IV Levophed for heart rate and blood pressure support.   RECOMMENDATIONS:   IV fluid at a rate of 100 cc/h. Wean Levophed once blood pressure is consistently above 100 mmHg. Continue IV ReoPro for 8 hours. Aspirin and Plavix dual antiplatelet therapy for 12 months. High intensity statin therapy. Institute beta-blocker therapy as tolerated by heart rate and blood pressure.  Consider angiotensin converting enzyme inhibitor or angiotensin receptor blocker for blood pressure control if required.  Patient has been on diuretic therapy as monotherapy for hypertension. Smoking cessation.    ASSESSMENT AND PLAN:  1. CAD with STEMI with PCI 2019>>now with new fatigue: -Denies chest pain today however reports more recent fatigue with activities which used to be easy for him.  -Long discussion with him that this could be many other things however could also be anginal equivalent therefore we will proceed with Lexiscan stress test to ensure safety.  -Continue Plavix>>no bleeding  -He ate today therefore will need Lipid panel with next OV -Obtain BMET, CBC   2. HLD: -Last LDL, 51 on 10/28/2019 -Continue atorvastatin -LDL goal <70 -Obtain Lipid panel with next OV   3. HTN: -Soft, 96/58>>>recheck at 110/78 -HR initially 102>>>recheck 72bpm -Continue current regimen   4. Hx bladder cancer: -Follows closely  with urology -No recurrent bleeding, pain   5.  Ongoing tobacco use: -Cessation strongly encouraged -Patient asking about starting nicotine patches therefore will supply with instructions on taper  6. Dilatated aortic root: -Last echo 04/2020 with increased diameter from 41mm to 98mm -Needs serial imaging  -BP WNL     Current medicines are reviewed at length with the patient today.  The patient  does not have concerns regarding medicines.  The following changes have been made:  decrease losartan to 12.5mg  QD  Labs/ tests ordered today include: BMET, CBC, Lexiscan stress test   Orders Placed This Encounter  Procedures   Basic metabolic panel   CBC   MYOCARDIAL PERFUSION IMAGING    Disposition:   FU with Dr. Tamala Julian in 3 months  Signed, Kathyrn Drown, NP  07/18/2020 12:32 PM    [address], [place], [place]  [address] Phone: [phone]; Fax: [phone]

## 2020-07-18 ENCOUNTER — Encounter: Payer: Self-pay | Admitting: Cardiology

## 2020-07-18 ENCOUNTER — Other Ambulatory Visit: Payer: Self-pay

## 2020-07-18 ENCOUNTER — Ambulatory Visit (INDEPENDENT_AMBULATORY_CARE_PROVIDER_SITE_OTHER): Payer: Self-pay | Admitting: Cardiology

## 2020-07-18 ENCOUNTER — Other Ambulatory Visit: Payer: Self-pay | Admitting: Cardiology

## 2020-07-18 VITALS — BP 110/78 | HR 72 | Ht 68.0 in | Wt 227.0 lb

## 2020-07-18 DIAGNOSIS — Z87891 Personal history of nicotine dependence: Secondary | ICD-10-CM

## 2020-07-18 DIAGNOSIS — I10 Essential (primary) hypertension: Secondary | ICD-10-CM

## 2020-07-18 DIAGNOSIS — I25118 Atherosclerotic heart disease of native coronary artery with other forms of angina pectoris: Secondary | ICD-10-CM

## 2020-07-18 DIAGNOSIS — E782 Mixed hyperlipidemia: Secondary | ICD-10-CM

## 2020-07-18 DIAGNOSIS — Z8551 Personal history of malignant neoplasm of bladder: Secondary | ICD-10-CM

## 2020-07-18 DIAGNOSIS — R5383 Other fatigue: Secondary | ICD-10-CM

## 2020-07-18 DIAGNOSIS — I77810 Thoracic aortic ectasia: Secondary | ICD-10-CM

## 2020-07-18 DIAGNOSIS — Z95820 Peripheral vascular angioplasty status with implants and grafts: Secondary | ICD-10-CM

## 2020-07-18 LAB — CBC
Hematocrit: 42.6 % (ref 37.5–51.0)
Hemoglobin: 14.1 g/dL (ref 13.0–17.7)
MCH: 30.5 pg (ref 26.6–33.0)
MCHC: 33.1 g/dL (ref 31.5–35.7)
MCV: 92 fL (ref 79–97)
Platelets: 446 10*3/uL (ref 150–450)
RBC: 4.63 x10E6/uL (ref 4.14–5.80)
RDW: 13.1 % (ref 11.6–15.4)
WBC: 8.8 10*3/uL (ref 3.4–10.8)

## 2020-07-18 LAB — BASIC METABOLIC PANEL
BUN/Creatinine Ratio: 14 (ref 10–24)
BUN: 11 mg/dL (ref 8–27)
CO2: 23 mmol/L (ref 20–29)
Calcium: 9.8 mg/dL (ref 8.6–10.2)
Chloride: 101 mmol/L (ref 96–106)
Creatinine, Ser: 0.8 mg/dL (ref 0.76–1.27)
Glucose: 213 mg/dL — ABNORMAL HIGH (ref 65–99)
Potassium: 5.1 mmol/L (ref 3.5–5.2)
Sodium: 140 mmol/L (ref 134–144)
eGFR: 100 mL/min/{1.73_m2} (ref >59)

## 2020-07-18 MED ORDER — LOSARTAN POTASSIUM 25 MG PO TABS: 12.5000 mg | ORAL_TABLET | Freq: Every day | ORAL | 3 refills | Status: DC

## 2020-07-18 NOTE — Patient Instructions (Addendum)
Medication Instructions:  Your physician has recommended you make the following change in your medication:   REDUCE the Losartan to 1/2 tablet daily = 12.5 mg daily     *If you need a refill on your cardiac medications before your next appointment, please call your pharmacy*   Lab Work: TODAY:  BMET & CBC  If you have labs (blood work) drawn today and your tests are completely normal, you will receive your results only by: [place] (if you have MyChart) OR A paper copy in the mail If you have any lab test that is abnormal or we need to change your treatment, we will call you to review the results.   Testing/Procedures: Your physician has requested that you have a lexiscan myoview. For further information please visit [email or web address]. Please follow instruction sheet, BELOW:    You are scheduled for a Myocardial Perfusion Imaging Study Please arrive 15 minutes prior to your appointment time for registration and insurance purposes.  The test will take approximately 3 to 4 hours to complete; you may bring reading material.  If someone comes with you to your appointment, they will need to remain in the main lobby due to limited space in the testing area. **If you are pregnant or breastfeeding, please notify the nuclear lab prior to your appointment**  How to prepare for your Myocardial Perfusion Test: Do not eat or drink 3 hours prior to your test, except you may have water. Do not consume products containing caffeine (regular or decaffeinated) 12 hours prior to your test. (ex: coffee, chocolate, sodas, tea). Do bring a list of your current medications with you.  If not listed below, you may take your medications as normal. Do wear comfortable clothes (no dresses or overalls) and walking shoes, tennis shoes preferred (No heels or open toe shoes are allowed). Do NOT wear cologne, perfume, aftershave, or lotions (deodorant is allowed). If these instructions are not followed,  your test will have to be rescheduled.     Follow-Up: At [facility], you and your health needs are our priority.  As part of our continuing mission to provide you with exceptional heart care, we have created designated Provider Care Teams.  These Care Teams include your primary Cardiologist (physician) and Advanced Practice Providers (APPs -  Physician Assistants and Nurse Practitioners) who all work together to provide you with the care you need, when you need it.  We recommend signing up for the patient portal called "MyChart".  Sign up information is provided on this After Visit Summary.  MyChart is used to connect with patients for Virtual Visits (Telemedicine).  Patients are able to view lab/test results, encounter notes, upcoming appointments, etc.  Non-urgent messages can be sent to your provider as well.   To learn more about what you can do with MyChart, go to [email or web address].    Your next appointment:   3 month(s)  The format for your next appointment:   In Person  Provider:   You may see Sinclair Grooms, MD or one of the following Advanced Practice Providers on your designated Care Team:   Kathyrn Drown, NP   Other Instructions

## 2020-07-25 ENCOUNTER — Telehealth (HOSPITAL_COMMUNITY): Payer: Self-pay | Admitting: *Deleted

## 2020-07-25 ENCOUNTER — Other Ambulatory Visit: Payer: Self-pay | Admitting: Cardiology

## 2020-07-25 DIAGNOSIS — I25118 Atherosclerotic heart disease of native coronary artery with other forms of angina pectoris: Secondary | ICD-10-CM

## 2020-07-25 NOTE — Progress Notes (Signed)
nut

## 2020-07-25 NOTE — Telephone Encounter (Signed)
Left message on voicemail per DPR in reference to upcoming appointment scheduled on 08/01/20 at 0745 with detailed instructions given per Myocardial Perfusion Study Information Sheet for the test. LM to arrive 15 minutes early, and that it is imperative to arrive on time for appointment to keep from having the test rescheduled. If you need to cancel or reschedule your appointment, please call the office within 24 hours of your appointment. Failure to do so may result in a cancellation of your appointment, and a $50 no show fee. Phone number given for call back for any questions. No mychart.Jabier Deese, Ranae Palms

## 2020-08-01 ENCOUNTER — Ambulatory Visit (HOSPITAL_COMMUNITY): Payer: Self-pay | Attending: Cardiology

## 2020-08-01 ENCOUNTER — Other Ambulatory Visit: Payer: Self-pay

## 2020-08-01 DIAGNOSIS — Z95820 Peripheral vascular angioplasty status with implants and grafts: Secondary | ICD-10-CM | POA: Insufficient documentation

## 2020-08-01 DIAGNOSIS — I25118 Atherosclerotic heart disease of native coronary artery with other forms of angina pectoris: Secondary | ICD-10-CM | POA: Insufficient documentation

## 2020-08-01 LAB — MYOCARDIAL PERFUSION IMAGING
LV dias vol: 116 mL (ref 62–150)
LV sys vol: 58 mL
Peak HR: 79 {beats}/min
Rest HR: 54 {beats}/min
SDS: 0
SRS: 0
SSS: 0
TID: 1.14

## 2020-08-01 MED ORDER — TECHNETIUM TC 99M TETROFOSMIN IV KIT
32.4000 | PACK | Freq: Once | INTRAVENOUS | Status: AC | PRN
  Administered 2020-08-01: 32.4 via INTRAVENOUS
  Filled 2020-08-01: qty 33

## 2020-08-01 MED ORDER — TECHNETIUM TC 99M TETROFOSMIN IV KIT
11.0000 | PACK | Freq: Once | INTRAVENOUS | Status: AC | PRN
  Administered 2020-08-01: 11 via INTRAVENOUS
  Filled 2020-08-01: qty 11

## 2020-08-01 MED ORDER — REGADENOSON 0.4 MG/5ML IV SOLN
0.4000 mg | Freq: Once | INTRAVENOUS | Status: AC
  Administered 2020-08-01: 0.4 mg via INTRAVENOUS

## 2020-08-11 ENCOUNTER — Other Ambulatory Visit: Payer: Self-pay | Admitting: Internal Medicine

## 2020-08-11 DIAGNOSIS — I25118 Atherosclerotic heart disease of native coronary artery with other forms of angina pectoris: Secondary | ICD-10-CM

## 2020-09-13 ENCOUNTER — Other Ambulatory Visit: Payer: Self-pay | Admitting: Student

## 2020-09-13 ENCOUNTER — Other Ambulatory Visit: Payer: Self-pay | Admitting: *Deleted

## 2020-09-13 DIAGNOSIS — I25118 Atherosclerotic heart disease of native coronary artery with other forms of angina pectoris: Secondary | ICD-10-CM

## 2020-09-13 MED ORDER — ISOSORBIDE MONONITRATE ER 60 MG PO TB24: 60.0000 mg | ORAL_TABLET | Freq: Every day | ORAL | 2 refills | Status: DC

## 2020-09-24 ENCOUNTER — Encounter: Payer: Self-pay | Admitting: Internal Medicine

## 2020-09-24 ENCOUNTER — Other Ambulatory Visit (HOSPITAL_COMMUNITY): Payer: Self-pay

## 2020-09-24 ENCOUNTER — Ambulatory Visit (INDEPENDENT_AMBULATORY_CARE_PROVIDER_SITE_OTHER): Payer: Self-pay | Admitting: Internal Medicine

## 2020-09-24 ENCOUNTER — Other Ambulatory Visit: Payer: Self-pay

## 2020-09-24 VITALS — BP 126/80 | HR 87 | Temp 98.2°F | Resp 24 | Ht 68.0 in | Wt 235.1 lb

## 2020-09-24 DIAGNOSIS — Z0000 Encounter for general adult medical examination without abnormal findings: Secondary | ICD-10-CM

## 2020-09-24 DIAGNOSIS — H8111 Benign paroxysmal vertigo, right ear: Secondary | ICD-10-CM

## 2020-09-24 DIAGNOSIS — I25118 Atherosclerotic heart disease of native coronary artery with other forms of angina pectoris: Secondary | ICD-10-CM

## 2020-09-24 DIAGNOSIS — I10 Essential (primary) hypertension: Secondary | ICD-10-CM

## 2020-09-24 MED ORDER — MECLIZINE HCL 25 MG PO TABS
25.0000 mg | ORAL_TABLET | Freq: Three times a day (TID) | ORAL | 0 refills | Status: DC | PRN
  Filled 2020-09-24: qty 30, 10d supply, fill #0

## 2020-09-24 MED ORDER — CLOPIDOGREL BISULFATE 75 MG PO TABS
75.0000 mg | ORAL_TABLET | Freq: Every day | ORAL | 3 refills | Status: DC
  Filled 2020-09-24: qty 30, 30d supply, fill #0
  Filled 2020-11-28: qty 30, 30d supply, fill #1

## 2020-09-24 MED ORDER — ISOSORBIDE MONONITRATE ER 60 MG PO TB24
60.0000 mg | ORAL_TABLET | Freq: Every day | ORAL | 2 refills | Status: DC
  Filled 2020-09-24: qty 30, 30d supply, fill #0
  Filled 2021-01-01: qty 30, 30d supply, fill #1
  Filled 2021-02-12: qty 30, 30d supply, fill #2
  Filled 2021-04-23: qty 30, 30d supply, fill #3
  Filled 2021-05-20: qty 30, 30d supply, fill #4
  Filled 2021-06-27: qty 30, 30d supply, fill #5
  Filled 2021-07-30: qty 30, 30d supply, fill #6
  Filled 2021-09-02: qty 30, 30d supply, fill #7
  Filled 2021-10-04: qty 30, 30d supply, fill #8

## 2020-09-24 MED ORDER — MECLIZINE HCL 25 MG PO TABS: 25.0000 mg | ORAL_TABLET | Freq: Three times a day (TID) | ORAL | 0 refills | Status: DC | PRN

## 2020-09-24 MED ORDER — NITROGLYCERIN 0.4 MG SL SUBL: 0.4000 mg | SUBLINGUAL_TABLET | SUBLINGUAL | 0 refills | Status: DC | PRN

## 2020-09-24 MED ORDER — NITROGLYCERIN 0.4 MG SL SUBL
0.4000 mg | SUBLINGUAL_TABLET | SUBLINGUAL | 2 refills | Status: DC | PRN
  Filled 2020-09-24: qty 25, 7d supply, fill #0
  Filled 2020-09-24: qty 30, 1d supply, fill #0

## 2020-09-24 MED ORDER — ATORVASTATIN CALCIUM 80 MG PO TABS
80.0000 mg | ORAL_TABLET | Freq: Every day | ORAL | 3 refills | Status: DC
  Filled 2020-09-24: qty 30, 30d supply, fill #0
  Filled 2021-01-01: qty 30, 30d supply, fill #1
  Filled 2021-02-12: qty 30, 30d supply, fill #2
  Filled 2021-03-15: qty 30, 30d supply, fill #3
  Filled 2021-04-18: qty 30, 30d supply, fill #4
  Filled 2021-05-20: qty 30, 30d supply, fill #5
  Filled 2021-06-17: qty 30, 30d supply, fill #6
  Filled 2021-07-17: qty 30, 30d supply, fill #7
  Filled 2021-08-15: qty 30, 30d supply, fill #8
  Filled 2021-09-12: qty 30, 30d supply, fill #9

## 2020-09-24 MED ORDER — LOSARTAN POTASSIUM 25 MG PO TABS
12.5000 mg | ORAL_TABLET | Freq: Every day | ORAL | 3 refills | Status: DC
Start: 2020-09-24 — End: 2021-01-22
  Filled 2020-09-24: qty 15, 30d supply, fill #0
  Filled 2020-11-28: qty 15, 30d supply, fill #1
  Filled 2021-01-01: qty 15, 30d supply, fill #2

## 2020-09-24 NOTE — Assessment & Plan Note (Signed)
Vitals:   09/24/20 1320  BP: 126/80  Pulse: 87  Resp: (!) 24  Temp: 98.2 F (36.8 C)  SpO2: 98%   Blood pressure at goal today.  Continue losartan 25 mg and Imdur 60 daily.

## 2020-09-24 NOTE — Assessment & Plan Note (Signed)
Patient follows with cardiology.  Requesting results of his recent stress test and echocardiogram.  Stress test was normal.  Echocardiogram did show an increase in dilation of his aortic root from 43 mm to 47 mm.  Cardiology notes recommended a possible CTA versus MRA.  Recommended patient to follow-up with cardiology.  He said he has an appointment next month.

## 2020-09-24 NOTE — Progress Notes (Signed)
   CC: Medication refills  HPI:  [name] is a 65 y.o. with a past medical history listed below presenting for medication refills. For details of today's visit and the status of his chronic medical issues please refer to the assessment and plan.   Past Medical History:  Diagnosis Date   Behcet's disease [facility])    per cardiologist note   Bladder cancer [facility])    urologist--- dr Diona Fanti---- first dx 12/ 2019 s/p turbt   CAD (coronary artery disease) cardiologist--- dr h. Tamala Julian   hx inferior STEMI  11-30-2017  s/p  PCI with DES to proxRCA;  normal echo 11-30-2017 in epic   DOE (dyspnea on exertion)    per pt a little sob w/ stairs but recovers quickly, stated this is usual   History of 2019 novel coronavirus disease (COVID-19) 09/2019   per pt had positive home test approx. 09/ 2021 with mild symptoms that resolved   History of ST elevation myocardial infarction (STEMI) 11/30/2017   s/p  pci w/ des   Hypertension    followed by pcp   Nocturia    S/P drug eluting coronary stent placement 11/30/2017   DES x1 to proxRCA   Wears contact lenses    Review of Systems: Negative except as per assessment and plan  Physical Exam:  Vitals:   09/24/20 1320  BP: 126/80  Pulse: 87  Resp: (!) 24  Temp: 98.2 F (36.8 C)  TempSrc: Oral  SpO2: 98%  Weight: 235 lb 1.6 oz (106.6 kg)  Height: '5\' 8"'$  (1.727 m)   Physical Exam General: alert, appears stated age, in no acute distress HEENT: Normocephalic, atraumatic, EOM intact, conjunctiva normal CV: Regular rate and rhythm, no murmurs rubs or gallops Pulm: Clear to auscultation bilaterally, normal work of breathing Abdomen: Soft, nondistended, bowel sounds present, no tenderness to palpation MSK: No lower extremity edema Skin: Warm and dry Neuro: Alert and oriented x3   Assessment & Plan:   See Encounters Tab for problem based charting.  Patient discussed with Dr. Heber Campbell

## 2020-09-24 NOTE — Assessment & Plan Note (Signed)
Patient requesting refill of meclizine.  He states that this is helped his intermittent vertigo.  Denies any severe symptoms.  States in the past he had severe nausea and vomiting.

## 2020-09-24 NOTE — Assessment & Plan Note (Signed)
Patient declines vaccination.  He also states that he is not interested in a colonoscopy at this time.  States that he may consider this once he has Medicare.  He had insurance in the past and does not feel like it helped cover most of his medical bills so opted to stay uninsured.  Discussed switching his medication refills to [facility] outpatient pharmacy said they cost him approximately $4.  Patient was excited to hear about this option.  Switched all of his refills to [facility] outpatient pharmacy

## 2020-09-25 NOTE — Progress Notes (Signed)
Internal Medicine Clinic Attending ° °Case discussed with Dr. Rehman  At the time of the visit.  We reviewed the resident’s history and exam and pertinent patient test results.  I agree with the assessment, diagnosis, and plan of care documented in the resident’s note.  ° °

## 2020-10-21 NOTE — Progress Notes (Signed)
Cardiology Office Note:    Date:  10/25/2020   ID:  Darius [name], DOB 08/22/1955, MRN [id number]  PCP:  Darius Beard, MD  Cardiologist:  Darius Grooms, MD   Referring MD: Darius Beard, MD   No chief complaint on file.   History of Present Illness:    Darius [name] is a 65 y.o. male with a hx of  Behcet's disease,  tobacco use, hyperlipidemia, primary hypertension, 2019 inferior STEMI treated with RCA DES (residual LAD 70% stenosis) hematuria on ASA/Brilinta, and bladder cancer which was resected in 12/2017.  Echo with dilated aortic root.   Sedentary lifestyle.  Does not walk for exercise.  Smokes cigarettes.  Gaining weight.  Denies chest pain.  Denies orthopnea and PND.  No episodes of syncope.  He is still complementary of the care that he received during the myocardial infarction.  Past Medical History:  Diagnosis Date   Behcet's disease [facility])    per cardiologist note   Bladder cancer [facility])    urologist--- dr Darius [name]---- first dx 12/ 2019 s/p turbt   CAD (coronary artery disease) cardiologist--- dr Darius [name]   hx inferior STEMI  11-30-2017  s/p  PCI with DES to proxRCA;  normal echo 11-30-2017 in epic   DOE (dyspnea on exertion)    per pt a little sob w/ stairs but recovers quickly, stated this is usual   History of 2019 novel coronavirus disease (COVID-19) 09/2019   per pt had positive home test approx. 09/ 2021 with mild symptoms that resolved   History of ST elevation myocardial infarction (STEMI) 11/30/2017   s/p  pci w/ des   Hypertension    followed by pcp   Nocturia    S/P drug eluting coronary stent placement 11/30/2017   DES x1 to proxRCA   Wears contact lenses     Past Surgical History:  Procedure Laterality Date   CORONARY/GRAFT ACUTE MI REVASCULARIZATION N/A 11/30/2017   Procedure: Coronary/Graft Acute MI Revascularization;  Surgeon: Darius Crome, MD;  Location: [place] [facility];  Service: Cardiovascular;  Laterality: N/A;   LEFT HEART  CATH AND CORONARY ANGIOGRAPHY N/A 11/30/2017   Procedure: LEFT HEART CATH AND CORONARY ANGIOGRAPHY;  Surgeon: Darius Crome, MD;  Location: [place] [facility];  Service: Cardiovascular;  Laterality: N/A;   TRANSURETHRAL RESECTION OF BLADDER TUMOR N/A 12/16/2017   Procedure: TRANSURETHRAL RESECTION OF BLADDER TUMOR (TURBT);  Surgeon: Darius Gallo, MD;  Location: [facility];  Service: Urology;  Laterality: N/A;  90 MINS   TRANSURETHRAL RESECTION OF BLADDER TUMOR WITH MITOMYCIN-C N/A 03/29/2020   Procedure: TRANSURETHRAL RESECTION OF BLADDER TUMOR WITH POST OPERATIVE INSTILLATION OF GEMCITABINE;  Surgeon: Darius Gallo, MD;  Location: [facility];  Service: Urology;  Laterality: N/A;  30 MINS    Current Medications: Current Meds  Medication Sig   atorvastatin (LIPITOR) 80 MG tablet Take 1 tablet (80 mg total) by mouth daily at 6 PM.   clopidogrel (PLAVIX) 75 MG tablet Take 1 tablet (75 mg total) by mouth daily.   isosorbide mononitrate (IMDUR) 60 MG 24 hr tablet Take 1 tablet (60 mg total) by mouth daily.   losartan (COZAAR) 25 MG tablet Take 0.5 tablets (12.5 mg total) by mouth daily. (Patient taking differently: Take 25 mg by mouth daily.)   meclizine (ANTIVERT) 25 MG tablet Take 1 tablet (25 mg total) by mouth 3 (three) times daily as needed for dizziness.   nitroGLYCERIN (NITROSTAT) 0.4 MG SL tablet Place 1 tablet (  0.4 mg total) under the tongue every 5 (five) minutes as needed for chest pain.   traMADol (ULTRAM) 50 MG tablet Take 1 tablet (50 mg total) by mouth every 6 (six) hours as needed for moderate pain.     Allergies:   Patient has no known allergies.   Social History   Socioeconomic History   Marital status: Married    Spouse name: Not on file   Number of children: Not on file   Years of education: Not on file   Highest education level: Not on file  Occupational History   Not on file  Tobacco Use   Smoking status: Some Days    Years: 45.00    Types:  Cigarettes   Smokeless tobacco: Never   Tobacco comments:    09-24-2020 per pt down to 1/2 ppwk, is trying to quit  Vaping Use   Vaping Use: Never used  Substance and Sexual Activity   Alcohol use: No   Drug use: Never   Sexual activity: Not on file  Other Topics Concern   Not on file  Social History Narrative   Not on file   Social Determinants of Health   Financial Resource Strain: Not on file  Food Insecurity: Not on file  Transportation Needs: Not on file  Physical Activity: Not on file  Stress: Not on file  Social Connections: Not on file     Family History: The patient's family history is not on file.  ROS:   Please see the history of present illness.    We discussed the enlarged aorta noted on the most recent echo which was also an increase in size compared to 2019 echo.  We discussed performing CT angio of the aorta to properly size his aorta.  All other systems reviewed and are negative.  EKGs/Labs/Other Studies Reviewed:    The following studies were reviewed today:  2D Doppler echocardiogram 05/03/2020 IMPRESSIONS    1. There is moderate dilatation of the aortic root, measuring 47 mm.  There is mild dilatation of the ascending aorta, measuring 44 mm.  Recommend CTA vs MRA for further evaluation. In 2019, the aortic root  measured 72mm.   2. Left ventricular ejection fraction, by estimation, is 60 to 65%. Left  ventricular ejection fraction by 3D volume is 69 %. The left ventricle has  normal function. The left ventricle has no regional wall motion  abnormalities. There is moderate  concentric left ventricular hypertrophy. Left ventricular diastolic  parameters are consistent with Grade II diastolic dysfunction  (pseudonormalization).   3. Right ventricular systolic function is normal. The right ventricular  size is mildly enlarged. There is normal pulmonary artery systolic  pressure. The estimated right ventricular systolic pressure is 01.0 mmHg.   4.  Left atrial size was moderately dilated.   5. Right atrial size was mildly dilated.   6. The mitral valve is normal in structure. Trivial mitral valve  regurgitation. No evidence of mitral stenosis.   7. The aortic valve is tricuspid. Aortic valve regurgitation is not  visualized. No aortic stenosis is present.   8. Aortic dilatation noted. There is moderate dilatation of the aortic  root, measuring 47 mm. There is mild dilatation of the ascending aorta,  measuring 44 mm.   9. The inferior vena cava is normal in size with greater than 50%  respiratory variability, suggesting right atrial pressure of 3 mmHg.   Comparison(s): Compared to prior echo in 2019, the aortic root has dilated  further  and measures 53mm (was previously 68mm).   08/01/2020 myocardial perfusion imaging: Study Highlights  The left ventricular ejection fraction is mildly decreased (45-54%). Nuclear stress EF: 50%. There was no ST segment deviation noted during stress. The study is normal. There are no perfusion defects suggestive of ischemia or scar. This is a low risk study.  EKG:  EKG normal sinus rhythm, inferior infarct, otherwise unremarkable.  Recent Labs: 07/18/2020: BUN 11; Creatinine, Ser 0.80; Hemoglobin 14.1; Platelets 446; Potassium 5.1; Sodium 140  Recent Lipid Panel    Component Value Date/Time   CHOL 105 10/28/2019 1434   TRIG 140 10/28/2019 1434   HDL 29 (L) 10/28/2019 1434   CHOLHDL 3.6 10/28/2019 1434   CHOLHDL 5.0 11/30/2017 0343   VLDL 15 11/30/2017 0343   LDLCALC 51 10/28/2019 1434    Physical Exam:    VS:  BP 122/82   Pulse 79   Ht 5\' 8"  (1.727 m)   Wt 233 lb 9.6 oz (106 kg)   SpO2 96%   BMI 35.52 kg/m     Wt Readings from Last 3 Encounters:  10/25/20 233 lb 9.6 oz (106 kg)  09/24/20 235 lb 1.6 oz (106.6 kg)  08/01/20 227 lb (103 kg)     GEN: Obese. No acute distress HEENT: Normal NECK: No JVD. LYMPHATICS: No lymphadenopathy CARDIAC: No murmur. RRR no gallop, or  edema. VASCULAR:  Normal Pulses. No bruits. RESPIRATORY:  Clear to auscultation without rales, wheezing or rhonchi  ABDOMEN: Soft, non-tender, non-distended, No pulsatile mass, MUSCULOSKELETAL: No deformity  SKIN: Warm and dry NEUROLOGIC:  Alert and oriented x 3 PSYCHIATRIC:  Normal affect   ASSESSMENT:    1. Coronary artery disease involving native coronary artery of native heart with other form of angina pectoris ([place])   2. Primary hypertension   3. History of tobacco use   4. Mixed hyperlipidemia   5. Aortic dilatation (HCC)   6. Hx of bladder cancer    PLAN:    In order of problems listed above:  Stable without anginal symptoms.  Secondary prevention discussed.  Encouraged aerobic activity.  Encouraged smoking cessation. If aorta is really dilated, we will further increase losartan and add beta-blocker therapy. Smoking cessation is recommended Continue high intensity statin therapy.  Lipid panel as soon as possible fasting along with a liver panel and basic metabolic panel. Ascending aortic diameter measured by echo can be spuriously if a perpendicular slice to the aorta is not made.  We will get a contrast CT angio of aorta to properly size and help manage his aorta.  If truly dilated, he will need beta-blocker therapy if his heart rate and and blood pressure can tolerate.   Medication Adjustments/Labs and Tests Ordered: Current medicines are reviewed at length with the patient today.  Concerns regarding medicines are outlined above.  Orders Placed This Encounter  Procedures   CT ANGIO CHEST AORTA W/CM & OR WO/CM   Basic metabolic panel   Hepatic function panel   Lipid panel   EKG 12-Lead    No orders of the defined types were placed in this encounter.   Patient Instructions  Medication Instructions:  Your physician recommends that you continue on your current medications as directed. Please refer to the Current Medication list given to you today.  *If you need a  refill on your cardiac medications before your next appointment, please call your pharmacy*   Lab Work: BMET, Liver and Lipid prior to your CT and when you are fasting.  You will need to be fasting for these labs (nothing to eat or drink after midnight except water and black coffee).  If you have labs (blood work) drawn today and your tests are completely normal, you will receive your results only by: [place] (if you have MyChart) OR A paper copy in the mail If you have any lab test that is abnormal or we need to change your treatment, we will call you to review the results.   Testing/Procedures: Non-Cardiac CT scanning, (CAT scanning), is a noninvasive, special x-ray that produces cross-sectional images of the body using x-rays and a computer. CT scans help physicians diagnose and treat medical conditions. For some CT exams, a contrast material is used to enhance visibility in the area of the body being studied. CT scans provide greater clarity and reveal more details than regular x-ray exams.    Follow-Up: At [facility], you and your health needs are our priority.  As part of our continuing mission to provide you with exceptional heart care, we have created designated Provider Care Teams.  These Care Teams include your primary Cardiologist (physician) and Advanced Practice Providers (APPs -  Physician Assistants and Nurse Practitioners) who all work together to provide you with the care you need, when you need it.  We recommend signing up for the patient portal called "MyChart".  Sign up information is provided on this After Visit Summary.  MyChart is used to connect with patients for Virtual Visits (Telemedicine).  Patients are able to view lab/test results, encounter notes, upcoming appointments, etc.  Non-urgent messages can be sent to your provider as well.   To learn more about what you can do with MyChart, go to [email or web address].    Your next appointment:   1  year(s)  The format for your next appointment:   In Person  Provider:   You may see Darius Grooms, MD or one of the following Advanced Practice Providers on your designated Care Team:   Cecilie Kicks, NP   Other Instructions  Steps to Quit Smoking Smoking tobacco is the leading cause of preventable death. It can affect almost every organ in the body. Smoking puts you and people around you at risk for many serious, long-lasting (chronic) diseases. Quitting smoking can be hard, but it is one of the best things that you can do for your health. It is never too late to quit. How do I get ready to quit? When you decide to quit smoking, make a plan to help you succeed. Before you quit: Pick a date to quit. Set a date within the next 2 weeks to give you time to prepare. Write down the reasons why you are quitting. Keep this list in places where you will see it often. Tell your family, friends, and co-workers that you are quitting. Their support is important. Talk with your doctor about the choices that may help you quit. Find out if your health insurance will pay for these treatments. Know the people, places, things, and activities that make you want to smoke (triggers). Avoid them. What first steps can I take to quit smoking? Throw away all cigarettes at home, at work, and in your car. Throw away the things that you use when you smoke, such as ashtrays and lighters. Clean your car. Make sure to empty the ashtray. Clean your home, including curtains and carpets. What can I do to help me quit smoking? Talk with your doctor about taking medicines and seeing a counselor  at the same time. You are more likely to succeed when you do both. If you are pregnant or breastfeeding, talk with your doctor about counseling or other ways to quit smoking. Do not take medicine to help you quit smoking unless your doctor tells you to do so. To quit smoking: Quit right away Quit smoking totally, instead of  slowly cutting back on how much you smoke over a period of time. Go to counseling. You are more likely to quit if you go to counseling sessions regularly. Take medicine You may take medicines to help you quit. Some medicines need a prescription, and some you can buy over-the-counter. Some medicines may contain a drug called nicotine to replace the nicotine in cigarettes. Medicines may: Help you to stop having the desire to smoke (cravings). Help to stop the problems that come when you stop smoking (withdrawal symptoms). Your doctor may ask you to use: Nicotine patches, gum, or lozenges. Nicotine inhalers or sprays. Non-nicotine medicine that is taken by mouth. Find resources Find resources and other ways to help you quit smoking and remain smoke-free after you quit. These resources are most helpful when you use them often. They include: Online chats with a Social worker. Phone quitlines. Printed Furniture conservator/restorer. Support groups or group counseling. Text messaging programs. Mobile phone apps. Use apps on your mobile phone or tablet that can help you stick to your quit plan. There are many free apps for mobile phones and tablets as well as websites. Examples include Quit Guide from the [employer] and smokefree.gov  What things can I do to make it easier to quit?  Talk to your family and friends. Ask them to support and encourage you. Call a phone quitline (1-800-QUIT-NOW), reach out to support groups, or work with a Social worker. Ask people who smoke to not smoke around you. Avoid places that make you want to smoke, such as: Bars. Parties. Smoke-break areas at work. Spend time with people who do not smoke. Lower the stress in your life. Stress can make you want to smoke. Try these things to help your stress: Getting regular exercise. Doing deep-breathing exercises. Doing yoga. Meditating. Doing a body scan. To do this, close your eyes, focus on one area of your body at a time from head to toe.  Notice which parts of your body are tense. Try to relax the muscles in those areas. How will I feel when I quit smoking? Day 1 to 3 weeks Within the first 24 hours, you may start to have some problems that come from quitting tobacco. These problems are very bad 2-3 days after you quit, but they do not often last for more than 2-3 weeks. You may get these symptoms: Mood swings. Feeling restless, nervous, angry, or annoyed. Trouble concentrating. Dizziness. Strong desire for high-sugar foods and nicotine. Weight gain. Trouble pooping (constipation). Feeling like you may vomit (nausea). Coughing or a sore throat. Changes in how the medicines that you take for other issues work in your body. Depression. Trouble sleeping (insomnia). Week 3 and afterward After the first 2-3 weeks of quitting, you may start to notice more positive results, such as: Better sense of smell and taste. Less coughing and sore throat. Slower heart rate. Lower blood pressure. Clearer skin. Better breathing. Fewer sick days. Quitting smoking can be hard. Do not give up if you fail the first time. Some people need to try a few times before they succeed. Do your best to stick to your quit plan, and talk with  your doctor if you have any questions or concerns. Summary Smoking tobacco is the leading cause of preventable death. Quitting smoking can be hard, but it is one of the best things that you can do for your health. When you decide to quit smoking, make a plan to help you succeed. Quit smoking right away, not slowly over a period of time. When you start quitting, seek help from your doctor, family, or friends. This information is not intended to replace advice given to you by your health care provider. Make sure you discuss any questions you have with your health care provider. Document Revised: 09/24/2018 Document Reviewed: 03/20/2018 Elsevier Patient Education  2022 [place], Darius Grooms, MD  10/25/2020 2:59 PM    [place]

## 2020-10-25 ENCOUNTER — Ambulatory Visit (INDEPENDENT_AMBULATORY_CARE_PROVIDER_SITE_OTHER): Payer: Self-pay | Admitting: Interventional Cardiology

## 2020-10-25 ENCOUNTER — Encounter: Payer: Self-pay | Admitting: Interventional Cardiology

## 2020-10-25 ENCOUNTER — Other Ambulatory Visit: Payer: Self-pay

## 2020-10-25 VITALS — BP 122/82 | HR 79 | Ht 68.0 in | Wt 233.6 lb

## 2020-10-25 DIAGNOSIS — Z87891 Personal history of nicotine dependence: Secondary | ICD-10-CM

## 2020-10-25 DIAGNOSIS — I10 Essential (primary) hypertension: Secondary | ICD-10-CM

## 2020-10-25 DIAGNOSIS — I77819 Aortic ectasia, unspecified site: Secondary | ICD-10-CM

## 2020-10-25 DIAGNOSIS — Z8551 Personal history of malignant neoplasm of bladder: Secondary | ICD-10-CM

## 2020-10-25 DIAGNOSIS — E782 Mixed hyperlipidemia: Secondary | ICD-10-CM

## 2020-10-25 DIAGNOSIS — I25118 Atherosclerotic heart disease of native coronary artery with other forms of angina pectoris: Secondary | ICD-10-CM

## 2020-10-25 NOTE — Patient Instructions (Signed)
Medication Instructions:  Your physician recommends that you continue on your current medications as directed. Please refer to the Current Medication list given to you today.  *If you need a refill on your cardiac medications before your next appointment, please call your pharmacy*   Lab Work: BMET, Liver and Lipid prior to your CT and when you are fasting.  You will need to be fasting for these labs (nothing to eat or drink after midnight except water and black coffee).  If you have labs (blood work) drawn today and your tests are completely normal, you will receive your results only by: Darius [name] (if you have MyChart) OR A paper copy in the mail If you have any lab test that is abnormal or we need to change your treatment, we will call you to review the results.   Testing/Procedures: Non-Cardiac CT scanning, (CAT scanning), is a noninvasive, special x-ray that produces cross-sectional images of the body using x-rays and a computer. CT scans help physicians diagnose and treat medical conditions. For some CT exams, a contrast material is used to enhance visibility in the area of the body being studied. CT scans provide greater clarity and reveal more details than regular x-ray exams.    Follow-Up: At [facility], you and your health needs are our priority.  As part of our continuing mission to provide you with exceptional heart care, we have created designated Provider Care Teams.  These Care Teams include your primary Cardiologist (physician) and Advanced Practice Providers (APPs -  Physician Assistants and Nurse Practitioners) who all work together to provide you with the care you need, when you need it.  We recommend signing up for the patient portal called "MyChart".  Sign up information is provided on this After Visit Summary.  MyChart is used to connect with patients for Virtual Visits (Telemedicine).  Patients are able to view lab/test results, encounter notes, upcoming  appointments, etc.  Non-urgent messages can be sent to your provider as well.   To learn more about what you can do with MyChart, go to [email or web address].    Your next appointment:   1 year(s)  The format for your next appointment:   In Person  Provider:   You may see Sinclair Grooms, MD or one of the following Advanced Practice Providers on your designated Care Team:   Cecilie Kicks, NP   Other Instructions  Steps to Quit Smoking Smoking tobacco is the leading cause of preventable death. It can affect almost every organ in the body. Smoking puts you and people around you at risk for many serious, long-lasting (chronic) diseases. Quitting smoking can be hard, but it is one of the best things that you can do for your health. It is never too late to quit. How do I get ready to quit? When you decide to quit smoking, make a plan to help you succeed. Before you quit: Pick a date to quit. Set a date within the next 2 weeks to give you time to prepare. Write down the reasons why you are quitting. Keep this list in places where you will see it often. Tell your family, friends, and co-workers that you are quitting. Their support is important. Talk with your doctor about the choices that may help you quit. Find out if your health insurance will pay for these treatments. Know the people, places, things, and activities that make you want to smoke (triggers). Avoid them. What first steps can I take to quit smoking? Throw  away all cigarettes at home, at work, and in your car. Throw away the things that you use when you smoke, such as ashtrays and lighters. Clean your car. Make sure to empty the ashtray. Clean your home, including curtains and carpets. What can I do to help me quit smoking? Talk with your doctor about taking medicines and seeing a counselor at the same time. You are more likely to succeed when you do both. If you are pregnant or breastfeeding, talk with your doctor about  counseling or other ways to quit smoking. Do not take medicine to help you quit smoking unless your doctor tells you to do so. To quit smoking: Quit right away Quit smoking totally, instead of slowly cutting back on how much you smoke over a period of time. Go to counseling. You are more likely to quit if you go to counseling sessions regularly. Take medicine You may take medicines to help you quit. Some medicines need a prescription, and some you can buy over-the-counter. Some medicines may contain a drug called nicotine to replace the nicotine in cigarettes. Medicines may: Help you to stop having the desire to smoke (cravings). Help to stop the problems that come when you stop smoking (withdrawal symptoms). Your doctor may ask you to use: Nicotine patches, gum, or lozenges. Nicotine inhalers or sprays. Non-nicotine medicine that is taken by mouth. Find resources Find resources and other ways to help you quit smoking and remain smoke-free after you quit. These resources are most helpful when you use them often. They include: Online chats with a Social worker. Phone quitlines. Printed Furniture conservator/restorer. Support groups or group counseling. Text messaging programs. Mobile phone apps. Use apps on your mobile phone or tablet that can help you stick to your quit plan. There are many free apps for mobile phones and tablets as well as websites. Examples include Quit Guide from the [employer] and smokefree.gov  What things can I do to make it easier to quit?  Talk to your family and friends. Ask them to support and encourage you. Call a phone quitline (1-800-QUIT-NOW), reach out to support groups, or work with a Social worker. Ask people who smoke to not smoke around you. Avoid places that make you want to smoke, such as: Bars. Parties. Smoke-break areas at work. Spend time with people who do not smoke. Lower the stress in your life. Stress can make you want to smoke. Try these things to help your  stress: Getting regular exercise. Doing deep-breathing exercises. Doing yoga. Meditating. Doing a body scan. To do this, close your eyes, focus on one area of your body at a time from head to toe. Notice which parts of your body are tense. Try to relax the muscles in those areas. How will I feel when I quit smoking? Day 1 to 3 weeks Within the first 24 hours, you may start to have some problems that come from quitting tobacco. These problems are very bad 2-3 days after you quit, but they do not often last for more than 2-3 weeks. You may get these symptoms: Mood swings. Feeling restless, nervous, angry, or annoyed. Trouble concentrating. Dizziness. Strong desire for high-sugar foods and nicotine. Weight gain. Trouble pooping (constipation). Feeling like you may vomit (nausea). Coughing or a sore throat. Changes in how the medicines that you take for other issues work in your body. Depression. Trouble sleeping (insomnia). Week 3 and afterward After the first 2-3 weeks of quitting, you may start to notice more positive results, such as:  Better sense of smell and taste. Less coughing and sore throat. Slower heart rate. Lower blood pressure. Clearer skin. Better breathing. Fewer sick days. Quitting smoking can be hard. Do not give up if you fail the first time. Some people need to try a few times before they succeed. Do your best to stick to your quit plan, and talk with your doctor if you have any questions or concerns. Summary Smoking tobacco is the leading cause of preventable death. Quitting smoking can be hard, but it is one of the best things that you can do for your health. When you decide to quit smoking, make a plan to help you succeed. Quit smoking right away, not slowly over a period of time. When you start quitting, seek help from your doctor, family, or friends. This information is not intended to replace advice given to you by your health care provider. Make sure you  discuss any questions you have with your health care provider. Document Revised: 09/24/2018 Document Reviewed: 03/20/2018 Elsevier Patient Education  [place].

## 2020-10-29 ENCOUNTER — Other Ambulatory Visit: Payer: Self-pay

## 2020-10-31 ENCOUNTER — Other Ambulatory Visit: Payer: Self-pay | Admitting: *Deleted

## 2020-10-31 ENCOUNTER — Other Ambulatory Visit: Payer: Self-pay

## 2020-10-31 DIAGNOSIS — E782 Mixed hyperlipidemia: Secondary | ICD-10-CM

## 2020-10-31 DIAGNOSIS — I77819 Aortic ectasia, unspecified site: Secondary | ICD-10-CM

## 2020-10-31 DIAGNOSIS — I25118 Atherosclerotic heart disease of native coronary artery with other forms of angina pectoris: Secondary | ICD-10-CM

## 2020-10-31 LAB — LIPID PANEL
Chol/HDL Ratio: 3.1 ratio (ref 0.0–5.0)
Cholesterol, Total: 103 mg/dL (ref 100–199)
HDL: 33 mg/dL — ABNORMAL LOW (ref >39)
LDL Chol Calc (NIH): 53 mg/dL (ref 0–99)
Triglycerides: 87 mg/dL (ref 0–149)
VLDL Cholesterol Cal: 17 mg/dL (ref 5–40)

## 2020-10-31 LAB — HEPATIC FUNCTION PANEL
ALT: 17 IU/L (ref 0–44)
AST: 15 IU/L (ref 0–40)
Albumin: 4.3 g/dL (ref 3.8–4.8)
Alkaline Phosphatase: 87 IU/L (ref 44–121)
Bilirubin Total: 0.8 mg/dL (ref 0.0–1.2)
Bilirubin, Direct: 0.22 mg/dL (ref 0.00–0.40)
Total Protein: 7.2 g/dL (ref 6.0–8.5)

## 2020-10-31 LAB — BASIC METABOLIC PANEL
BUN/Creatinine Ratio: 12 (ref 10–24)
BUN: 9 mg/dL (ref 8–27)
CO2: 22 mmol/L (ref 20–29)
Calcium: 9.4 mg/dL (ref 8.6–10.2)
Chloride: 104 mmol/L (ref 96–106)
Creatinine, Ser: 0.75 mg/dL — ABNORMAL LOW (ref 0.76–1.27)
Glucose: 114 mg/dL — ABNORMAL HIGH (ref 70–99)
Potassium: 4.6 mmol/L (ref 3.5–5.2)
Sodium: 142 mmol/L (ref 134–144)
eGFR: 102 mL/min/{1.73_m2} (ref >59)

## 2020-11-02 ENCOUNTER — Ambulatory Visit (INDEPENDENT_AMBULATORY_CARE_PROVIDER_SITE_OTHER)
Admission: RE | Admit: 2020-11-02 | Discharge: 2020-11-02 | Disposition: A | Discharge status: Home / Self Care | Payer: Self-pay | Source: Ambulatory Visit | Attending: Interventional Cardiology | Admitting: Interventional Cardiology

## 2020-11-02 ENCOUNTER — Other Ambulatory Visit: Payer: Self-pay

## 2020-11-02 ENCOUNTER — Other Ambulatory Visit: Payer: Self-pay | Admitting: *Deleted

## 2020-11-02 DIAGNOSIS — I77819 Aortic ectasia, unspecified site: Secondary | ICD-10-CM

## 2020-11-02 DIAGNOSIS — I25118 Atherosclerotic heart disease of native coronary artery with other forms of angina pectoris: Secondary | ICD-10-CM

## 2020-11-02 DIAGNOSIS — I7121 Aneurysm of the ascending aorta, without rupture: Secondary | ICD-10-CM

## 2020-11-02 MED ORDER — IOHEXOL 350 MG/ML SOLN
100.0000 mL | Freq: Once | INTRAVENOUS | Status: AC | PRN
  Administered 2020-11-02: 100 mL via INTRAVENOUS

## 2020-11-28 ENCOUNTER — Other Ambulatory Visit (HOSPITAL_COMMUNITY): Payer: Self-pay

## 2020-12-10 ENCOUNTER — Other Ambulatory Visit: Payer: Self-pay | Admitting: Urology

## 2020-12-10 ENCOUNTER — Telehealth: Payer: Self-pay | Admitting: Interventional Cardiology

## 2020-12-10 MED ORDER — GEMCITABINE CHEMO FOR BLADDER INSTILLATION 2000 MG: 2000.0000 mg | Freq: Once | INTRAVENOUS | Status: AC

## 2020-12-10 NOTE — Telephone Encounter (Signed)
   Name: Darius [name]  DOB: June 29, 1955  MRN: [id number]   Primary Cardiologist: Sinclair Grooms, MD  Chart reviewed as part of pre-operative protocol coverage. Patient was contacted 12/10/2020 in reference to pre-operative risk assessment for pending surgery as outlined below.  Darius [name] was last seen on 10/25/20 by Dr. Tamala Julian.  Since that day, Darius [name] has done well. He is able to complete 4.0 METS without angina.   Dr. Tamala Julian previously approved plavix hold for surgery - may hold 7 days and resume as soon after when safe. Given his residual 70% stenosis in the LAD, we prefer to minimize his time off of plavix.   Therefore, based on ACC/AHA guidelines, the patient would be at acceptable risk for the planned procedure without further cardiovascular testing.   The patient was advised that if he develops new symptoms prior to surgery to contact our office to arrange for a follow-up visit, and he verbalized understanding.  I will route this recommendation to the requesting party via Epic fax function and remove from pre-op pool. Please call with questions.  Ledora Bottcher, PA 12/10/2020, 3:43 PM

## 2020-12-10 NOTE — Telephone Encounter (Signed)
   [facility] Pre-operative Risk Assessment    Patient Name: Darius [name]  DOB: 1955/07/08 MRN: [id number]  HEARTCARE STAFF:  - IMPORTANT!!!!!! Under Visit Info/Reason for Call, type in Other and utilize the format Clearance MM/DD/YY or Clearance TBD. Do not use dashes or single digits. - Please review there is not already an duplicate clearance open for this procedure. - If request is for dental extraction, please clarify the # of teeth to be extracted. - If the patient is currently at the dentist's office, call Pre-Op Callback Staff (MA/nurse) to input urgent request.  - If the patient is not currently in the dentist office, please route to the Pre-Op pool.  Request for surgical clearance:  What type of surgery is being performed? Removal of a bladder tumor  When is this surgery scheduled? 12-24-20 What type of clearance is required (medical clearance vs. Pharmacy clearance to hold med vs. Both)? Both  Are there any medications that need to be held prior to surgery and how long? Cn he stop his Plavix 1 week prior to surgery  Practice name and name of physician performing surgery? Dr Darius [name]  What is the office phone number? [phone]   7.   What is the office fax number? [phone]  8.   Anesthesia type (None, local, MAC, general) ? General   Darius [name] 12/10/2020, 12:58 PM  _________________________________________________________________   (provider comments below)

## 2020-12-12 ENCOUNTER — Encounter (HOSPITAL_BASED_OUTPATIENT_CLINIC_OR_DEPARTMENT_OTHER): Payer: Self-pay | Admitting: Urology

## 2020-12-13 ENCOUNTER — Other Ambulatory Visit: Payer: Self-pay

## 2020-12-13 ENCOUNTER — Encounter (HOSPITAL_BASED_OUTPATIENT_CLINIC_OR_DEPARTMENT_OTHER): Payer: Self-pay | Admitting: Urology

## 2020-12-13 NOTE — Progress Notes (Signed)
Spoke w/ via phone for pre-op interview--- pt Lab needs dos----    [employer] results------ current ekg in epic/ chart COVID test -----patient states asymptomatic no test needed Arrive at ------- 0800 on 12-17-2020 NPO after MN NO Solid Food.  Clear liquids from MN until--- 0700 Med rec completed Medications to take morning of surgery ----- Imdur Diabetic medication ----- n/a Patient instructed no nail polish to be worn day of surgery Patient instructed to bring photo id and insurance card day of surgery Patient aware to have Driver (ride ) / caregiver  for 24 hours after surgery --wife, [facility] Patient Special Instructions ----- n/a Pre-Op special Istructions ----- pt has telephone cardiac clearance by Fabian Sharp PA dated 12-10-2020 in epic/ chart Patient verbalized understanding of instructions that were given at this phone interview. Patient denies shortness of breath, chest pain, fever, cough at this phone interview.    Anesthesia Review: HTN:  CAD hx inferior STEMI 11-30-2017 s/p PCI with DES to RCA; AAA , 69mm; Pt denies cardiac s&s, no peripheral swelling, but does get sob w/ stairs recovers quickly (which is usual for him). Pt stated last taken nitro approx > 1 yr ago.   Notes/ results in epic PCP: Dr Idelle Jo (lov 09-24-2020 ) Cardiologist : Dr Linard Millers (lov 10-25-2020 ) Chest x-ray : CTA 11-02-2020 EKG : 10-25-2020 Echo : 05-03-2020 Stress test: nuclear 08-01-2020 Cardiac Cath :  11-30-2017 Activity level: see above Sleep Study/ CPAP : no  Blood Thinner/ Instructions Maryjane Hurter Dose: Plavix ASA / Instructions/ Last Dose :  no Pt stated given instructions from cardiology to stop plavix 7 days prior ot surgery, last dose 12-09-2020.

## 2020-12-17 ENCOUNTER — Ambulatory Visit (HOSPITAL_BASED_OUTPATIENT_CLINIC_OR_DEPARTMENT_OTHER)
Admission: RE | Admit: 2020-12-17 | Discharge: 2020-12-17 | Disposition: A | Discharge status: Home / Self Care | Payer: Self-pay | Source: Ambulatory Visit | Attending: Urology | Admitting: Urology

## 2020-12-17 ENCOUNTER — Ambulatory Visit (HOSPITAL_BASED_OUTPATIENT_CLINIC_OR_DEPARTMENT_OTHER): Payer: Self-pay | Admitting: Anesthesiology

## 2020-12-17 ENCOUNTER — Other Ambulatory Visit: Payer: Self-pay

## 2020-12-17 ENCOUNTER — Encounter (HOSPITAL_BASED_OUTPATIENT_CLINIC_OR_DEPARTMENT_OTHER)
Admission: RE | Disposition: A | Discharge status: Home / Self Care | Payer: Self-pay | Source: Ambulatory Visit | Attending: Urology

## 2020-12-17 ENCOUNTER — Encounter (HOSPITAL_BASED_OUTPATIENT_CLINIC_OR_DEPARTMENT_OTHER): Payer: Self-pay | Admitting: Urology

## 2020-12-17 DIAGNOSIS — Z87891 Personal history of nicotine dependence: Secondary | ICD-10-CM | POA: Insufficient documentation

## 2020-12-17 DIAGNOSIS — I10 Essential (primary) hypertension: Secondary | ICD-10-CM | POA: Insufficient documentation

## 2020-12-17 DIAGNOSIS — I2510 Atherosclerotic heart disease of native coronary artery without angina pectoris: Secondary | ICD-10-CM | POA: Insufficient documentation

## 2020-12-17 DIAGNOSIS — I252 Old myocardial infarction: Secondary | ICD-10-CM | POA: Insufficient documentation

## 2020-12-17 DIAGNOSIS — C679 Malignant neoplasm of bladder, unspecified: Secondary | ICD-10-CM | POA: Insufficient documentation

## 2020-12-17 DIAGNOSIS — Z955 Presence of coronary angioplasty implant and graft: Secondary | ICD-10-CM | POA: Insufficient documentation

## 2020-12-17 DIAGNOSIS — Z7902 Long term (current) use of antithrombotics/antiplatelets: Secondary | ICD-10-CM | POA: Insufficient documentation

## 2020-12-17 HISTORY — DX: Abdominal aortic aneurysm, without rupture, unspecified: I71.40

## 2020-12-17 HISTORY — DX: Emphysema, unspecified: J43.9

## 2020-12-17 HISTORY — PX: TRANSURETHRAL RESECTION OF BLADDER TUMOR WITH MITOMYCIN-C: SHX6459

## 2020-12-17 LAB — POCT I-STAT, CHEM 8
BUN: 10 mg/dL (ref 8–23)
Calcium, Ion: 1.26 mmol/L (ref 1.15–1.40)
Chloride: 102 mmol/L (ref 98–111)
Creatinine, Ser: 0.7 mg/dL (ref 0.61–1.24)
Glucose, Bld: 127 mg/dL — ABNORMAL HIGH (ref 70–99)
HCT: 47 % (ref 39.0–52.0)
Hemoglobin: 16 g/dL (ref 13.0–17.0)
Potassium: 3.9 mmol/L (ref 3.5–5.1)
Sodium: 142 mmol/L (ref 135–145)
TCO2: 25 mmol/L (ref 22–32)

## 2020-12-17 SURGERY — TRANSURETHRAL RESECTION OF BLADDER TUMOR WITH MITOMYCIN-C
Anesthesia: General | Site: Bladder

## 2020-12-17 MED ORDER — SODIUM CHLORIDE 0.9 % IR SOLN
Status: DC | PRN
  Administered 2020-12-17 (×2): 3000 mL

## 2020-12-17 MED ORDER — PROPOFOL 10 MG/ML IV BOLUS
INTRAVENOUS | Status: DC | PRN
  Administered 2020-12-17: 20 mg via INTRAVENOUS
  Administered 2020-12-17: 180 mg via INTRAVENOUS

## 2020-12-17 MED ORDER — PROPOFOL 10 MG/ML IV BOLUS
INTRAVENOUS | Status: AC
  Filled 2020-12-17: qty 20

## 2020-12-17 MED ORDER — MIDAZOLAM HCL 2 MG/2ML IJ SOLN
INTRAMUSCULAR | Status: AC
  Filled 2020-12-17: qty 2

## 2020-12-17 MED ORDER — STERILE WATER FOR IRRIGATION IR SOLN
Status: DC | PRN
  Administered 2020-12-17: 500 mL

## 2020-12-17 MED ORDER — 0.9 % SODIUM CHLORIDE (POUR BTL) OPTIME
TOPICAL | Status: DC | PRN
  Administered 2020-12-17: 500 mL

## 2020-12-17 MED ORDER — CEFAZOLIN SODIUM-DEXTROSE 2-4 GM/100ML-% IV SOLN
2.0000 g | INTRAVENOUS | Status: AC
  Administered 2020-12-17: 2 g via INTRAVENOUS

## 2020-12-17 MED ORDER — LIDOCAINE 2% (20 MG/ML) 5 ML SYRINGE
INTRAMUSCULAR | Status: AC
  Filled 2020-12-17: qty 5

## 2020-12-17 MED ORDER — GEMCITABINE CHEMO FOR BLADDER INSTILLATION 2000 MG
2000.0000 mg | Freq: Once | INTRAVENOUS | Status: AC
  Administered 2020-12-17: 2000 mg via INTRAVESICAL
  Filled 2020-12-17: qty 2000

## 2020-12-17 MED ORDER — LACTATED RINGERS IV SOLN: INTRAVENOUS | Status: DC

## 2020-12-17 MED ORDER — CEFAZOLIN SODIUM-DEXTROSE 2-4 GM/100ML-% IV SOLN
INTRAVENOUS | Status: AC
  Filled 2020-12-17: qty 100

## 2020-12-17 MED ORDER — ACETAMINOPHEN 10 MG/ML IV SOLN
INTRAVENOUS | Status: DC | PRN
  Administered 2020-12-17: 1000 mg via INTRAVENOUS

## 2020-12-17 MED ORDER — MIDAZOLAM HCL 5 MG/5ML IJ SOLN
INTRAMUSCULAR | Status: DC | PRN
  Administered 2020-12-17: 2 mg via INTRAVENOUS

## 2020-12-17 MED ORDER — ONDANSETRON HCL 4 MG/2ML IJ SOLN
INTRAMUSCULAR | Status: DC | PRN
  Administered 2020-12-17: 4 mg via INTRAVENOUS

## 2020-12-17 MED ORDER — DEXAMETHASONE SODIUM PHOSPHATE 10 MG/ML IJ SOLN
INTRAMUSCULAR | Status: DC | PRN
  Administered 2020-12-17: 10 mg via INTRAVENOUS

## 2020-12-17 MED ORDER — ACETAMINOPHEN 10 MG/ML IV SOLN
INTRAVENOUS | Status: AC
  Filled 2020-12-17: qty 100

## 2020-12-17 MED ORDER — LIDOCAINE 2% (20 MG/ML) 5 ML SYRINGE
INTRAMUSCULAR | Status: DC | PRN
  Administered 2020-12-17: 100 mg via INTRAVENOUS

## 2020-12-17 MED ORDER — FENTANYL CITRATE (PF) 100 MCG/2ML IJ SOLN
INTRAMUSCULAR | Status: DC | PRN
  Administered 2020-12-17 (×2): 25 ug via INTRAVENOUS

## 2020-12-17 MED ORDER — FENTANYL CITRATE (PF) 100 MCG/2ML IJ SOLN
INTRAMUSCULAR | Status: AC
  Filled 2020-12-17: qty 2

## 2020-12-17 SURGICAL SUPPLY — 34 items
BAG DRAIN URO-CYSTO SKYTR STRL (DRAIN) ×2 IMPLANT
BAG DRN RND TRDRP ANRFLXCHMBR (UROLOGICAL SUPPLIES)
BAG DRN UROCATH (DRAIN) ×1
BAG URINE DRAIN 2000ML AR STRL (UROLOGICAL SUPPLIES) IMPLANT
BAG URINE LEG 500ML (DRAIN) IMPLANT
CATH FOLEY 2WAY SLVR  5CC 18FR (CATHETERS) ×2
CATH FOLEY 2WAY SLVR  5CC 20FR (CATHETERS)
CATH FOLEY 2WAY SLVR  5CC 22FR (CATHETERS)
CATH FOLEY 2WAY SLVR 5CC 18FR (CATHETERS) ×1 IMPLANT
CATH FOLEY 2WAY SLVR 5CC 20FR (CATHETERS) IMPLANT
CATH FOLEY 2WAY SLVR 5CC 22FR (CATHETERS) IMPLANT
CATH FOLEY 3WAY 30CC 22FR (CATHETERS) IMPLANT
CLOTH BEACON ORANGE TIMEOUT ST (SAFETY) ×2 IMPLANT
ELECT REM PT RETURN 9FT ADLT (ELECTROSURGICAL)
ELECTRODE REM PT RTRN 9FT ADLT (ELECTROSURGICAL) IMPLANT
EVACUATOR MICROVAS BLADDER (UROLOGICAL SUPPLIES) IMPLANT
GLOVE SURG ENC MOIS LTX SZ8 (GLOVE) ×2 IMPLANT
GLOVE SURG NEOP MICRO LF SZ6.5 (GLOVE) ×4 IMPLANT
GOWN STRL REUS W/ TWL LRG LVL3 (GOWN DISPOSABLE) ×1 IMPLANT
GOWN STRL REUS W/TWL LRG LVL3 (GOWN DISPOSABLE) ×2
GOWN STRL REUS W/TWL XL LVL3 (GOWN DISPOSABLE) ×2 IMPLANT
HOLDER FOLEY CATH W/STRAP (MISCELLANEOUS) ×1 IMPLANT
IV NS IRRIG 3000ML ARTHROMATIC (IV SOLUTION) ×4 IMPLANT
KIT TURNOVER CYSTO (KITS) ×2 IMPLANT
LOOP CUT BIPOLAR 24F LRG (ELECTROSURGICAL) ×2 IMPLANT
MANIFOLD NEPTUNE II (INSTRUMENTS) ×2 IMPLANT
NS IRRIG 500ML POUR BTL (IV SOLUTION) ×2 IMPLANT
PACK CYSTO (CUSTOM PROCEDURE TRAY) ×2 IMPLANT
PLUG CATH AND CAP STER (CATHETERS) IMPLANT
SYR TOOMEY IRRIG 70ML (MISCELLANEOUS) ×2
SYRINGE TOOMEY IRRIG 70ML (MISCELLANEOUS) ×1 IMPLANT
TUBE CONNECTING 12X1/4 (SUCTIONS) ×2 IMPLANT
TUBING UROLOGY SET (TUBING) IMPLANT
WATER STERILE IRR 500ML POUR (IV SOLUTION) ×2 IMPLANT

## 2020-12-17 NOTE — Op Note (Signed)
Preoperative diagnosis: Recurrent bladder cancer  Postoperative diagnosis: Same.  Principal procedure: Cystoscopy, transurethral resection of recurrent bladder cancer, placement of intravesical gemcitabine (aggregate diameter of bladder tumors 3 cm, largest tumor diameter 1 cm)  Surgeon: Dafina Suk  Anesthesia: General with LMA  Complications: None  Estimated blood loss: Less than 5 mL  Specimen: Bladder tumor fragments, to pathology  Indications: 65 year old male presents for resection of recurrent bladder tumors.  Initial resection on 12/16/2017.  3 cm papillary tumor, low-grade, nonmuscle invasive.  Treatment was shortly after he was placed on Plavix after stenting for an MI.  He had a recurrent tumor resected in March of this year.  Recent cystoscopy revealed 3 papillary recurrences, 2 near the ureteral orifice on the right, 1 at the dome.  He presents at this time for resection, placement of gemcitabine.  I discussed the procedure with the patient, expected outcome, risks and complications including bladder injury, infection, bleeding, need for catheter placement.  Additionally I discussed placement of gemcitabine which has been performed on the prior to resections.  He understands and desires to proceed.  Findings: Urethra was normal.  Prostate was nonobstructive.  No lesions within the urethra or prostatic urethra.  Ureteral orifices were normal in location and configuration.  2 papillary recurrences just to the right of the right ureteral orifice, each approximately 8 mm in size.  Additionally, 1 papillary tumor just to the left of the midline at the dome.  No other lesions were seen.  Mild trabeculations of the bladder.  Description of procedure: The patient was properly identified in the holding area.  Is taken to the operating room where general anesthetic was administered with the LMA.  He was placed in the dorsolithotomy position.  Genitalia and perineum were prepped, draped, proper  timeout performed.  36 French resectoscope sheath passed with the visual obturator.  After systematic inspection, the resectoscope and cutting loop were placed.  All 3 of the tumors were resected down to the muscular layer with the loop.  Fragments were removed.  Resected sites were cauterized.  Repeat systematic inspection revealed no further urothelial abnormalities.  At this point, the scope was removed.  The bladder had been drained, but 18 French Foley catheter was placed, balloon filled with 10 cc of water.  Hooked to dependent drainage.  At this point, the patient was awakened, taken to the PACU in stable condition.  He tolerated procedure well.  In the PACU, 2 g of gemcitabine and diluent were placed in the bladder, left indwelling for 1 hour, then the bladder was drained.

## 2020-12-17 NOTE — Discharge Instructions (Addendum)
You may see some blood in the urine and may have some burning with urination for 48-72 hours. You also may notice that you have to urinate more frequently or urgently after your procedure which is normal.  You should call should you develop an inability urinate, fever > 101, persistent nausea and vomiting that prevents you from eating or drinking to stay hydrated.  If you have a stent, you will likely urinate more frequently and urgently until the stent is removed and you may experience some discomfort/pain in the lower abdomen and flank especially when urinating. You may take pain medication prescribed to you if needed for pain. You may also intermittently have blood in the urine until the stent is removed. If you have a catheter, you will be taught how to take care of the catheter by the nursing staff prior to discharge from the hospital.  You may periodically feel a strong urge to void with the catheter in place.  This is a bladder spasm and most often can occur when having a bowel movement or moving around. It is typically self-limited and usually will stop after a few minutes.  You may use some Vaseline or Neosporin around the tip of the catheter to reduce friction at the tip of the penis. You may also see some blood in the urine.  A very small amount of blood can make the urine look quite red.  As long as the catheter is draining well, there usually is not a problem.  However, if the catheter is not draining well and is bloody, you should call the office [phone]) to notify us.  It is okay to remove the catheter as instructed by the nurses on Tuesday morning.  Post Anesthesia Home Care Instructions  Activity: Get plenty of rest for the remainder of the day. A responsible adult should stay with you for 24 hours following the procedure.  For the next 24 hours, DO NOT: -Drive a car -Paediatric nurse -Drink alcoholic beverages -Take any medication unless instructed by your physician -Make any  legal decisions or sign important papers.  Meals: Start with liquid foods such as gelatin or soup. Progress to regular foods as tolerated. Avoid greasy, spicy, heavy foods. If nausea and/or vomiting occur, drink only clear liquids until the nausea and/or vomiting subsides. Call your physician if vomiting continues.  Special Instructions/Symptoms: Your throat may feel dry or sore from the anesthesia or the breathing tube placed in your throat during surgery. If this causes discomfort, gargle with warm salt water. The discomfort should disappear within 24 hours.

## 2020-12-17 NOTE — H&P (Signed)
H&P  Chief Complaint: Recurrent bladder cancer  History of Present Illness: 65 year old male smoker presents at this time for repeat resection of a recurrent urothelial carcinoma the bladder.  Initial TURBT 12/16/2017.  At that time he presented with gross hematuria following antiplatelet therapy for a STEMI.  Initial pathology revealed low-grade nonmuscle invasive bladder cancer.  Recurrent TURBT on 03/31/2020 for 2 cm bladder tumor.  Again, low-grade.  Recent cystoscopy revealed the patient to have several small papillary recurrences.  He presents at this time for repeat TURBT, placement of gemcitabine.  Past Medical History:  Diagnosis Date   AAA (abdominal aortic aneurysm)    followed by cardiologist--- last CTA 11-02-2020,  24mm   Behcet's disease ([place])    per cardiologist note   Bladder cancer [facility])    urologist--- dr Diona Fanti---- first dx 12/ 2019 s/p turbt   CAD (coronary artery disease) cardiologist--- dr h. Tamala Julian   hx inferior STEMI  11-30-2017  s/p  PCI with DES to proxRCA;  normal echo 11-30-2017 in epic   DOE (dyspnea on exertion)    per pt a little sob w/ stairs but recovers quickly, stated this is usual   Emphysema/COPD [facility])    History of 2019 novel coronavirus disease (COVID-19) 09/2019   per pt had positive home test approx. 09/ 2021 with mild symptoms that resolved   History of ST elevation myocardial infarction (STEMI) 11/30/2017   s/p  pci w/ des   Hypertension    followed by pcp   Nocturia    S/P drug eluting coronary stent placement 11/30/2017   DES x1 to proxRCA   Wears contact lenses     Past Surgical History:  Procedure Laterality Date   CORONARY/GRAFT ACUTE MI REVASCULARIZATION N/A 11/30/2017   Procedure: Coronary/Graft Acute MI Revascularization;  Surgeon: Belva Crome, MD;  Location: [place] [facility];  Service: Cardiovascular;  Laterality: N/A;   LEFT HEART CATH AND CORONARY ANGIOGRAPHY N/A 11/30/2017   Procedure: LEFT HEART CATH AND CORONARY  ANGIOGRAPHY;  Surgeon: Belva Crome, MD;  Location: [place] [facility];  Service: Cardiovascular;  Laterality: N/A;   TRANSURETHRAL RESECTION OF BLADDER TUMOR N/A 12/16/2017   Procedure: TRANSURETHRAL RESECTION OF BLADDER TUMOR (TURBT);  Surgeon: Franchot Gallo, MD;  Location: [facility];  Service: Urology;  Laterality: N/A;  90 MINS   TRANSURETHRAL RESECTION OF BLADDER TUMOR WITH MITOMYCIN-C N/A 03/29/2020   Procedure: TRANSURETHRAL RESECTION OF BLADDER TUMOR WITH POST OPERATIVE INSTILLATION OF GEMCITABINE;  Surgeon: Franchot Gallo, MD;  Location: [facility];  Service: Urology;  Laterality: N/A;  30 MINS    Home Medications:    Allergies: No Known Allergies  History reviewed. No pertinent family history.  Social History:  reports that he quit smoking 12 days ago. His smoking use included cigarettes. He has never used smokeless tobacco. He reports that he does not drink alcohol and does not use drugs.  ROS: A complete review of systems was performed.  All systems are negative except for pertinent findings as noted.  Physical Exam:  Vital signs in last 24 hours: BP (!) 162/82   Pulse (!) 58   Temp 98.7 F (37.1 C) (Oral)   Resp 17   Ht 5\' 8"  (1.727 m)   Wt 106.4 kg   SpO2 97%   BMI 35.66 kg/m  Constitutional:  Alert and oriented, No acute distress Cardiovascular: Regular rate  Respiratory: Normal respiratory effort Neurologic: Grossly intact, no focal deficits Psychiatric: Normal mood and affect  I  have reviewed prior pt notes  I have reviewed notes from referring/previous physicians  I have reviewed urinalysis results  I have independently reviewed prior imaging  I have reviewed prior PSA results  I have reviewed prior urine culture   Impression/Assessment:  Recurrent urothelial carcinoma the bladder  Plan:  Cystoscopy, bilateral retrograde pyelograms, TURBT, placement of gemcitabine

## 2020-12-17 NOTE — Anesthesia Postprocedure Evaluation (Signed)
Anesthesia Post Note  Patient: Darius [name]  Procedure(s) Performed: TRANSURETHRAL RESECTION OF BLADDER TUMOR WITH GEMCITABINE IN PACU (Bladder)     Patient location during evaluation: PACU Anesthesia Type: General Level of consciousness: awake and alert Pain management: pain level controlled Vital Signs Assessment: post-procedure vital signs reviewed and stable Respiratory status: spontaneous breathing, nonlabored ventilation, respiratory function stable and patient connected to nasal cannula oxygen Cardiovascular status: blood pressure returned to baseline and stable Postop Assessment: no apparent nausea or vomiting Anesthetic complications: no   No notable events documented.  Last Vitals:  Vitals:   12/17/20 1040 12/17/20 1100  BP: 134/81 131/82  Pulse: 73 61  Resp: 14 11  Temp:    SpO2: 95% 99%    Last Pain:  Vitals:   12/17/20 1100  TempSrc:   PainSc: 0-No pain                 [name]

## 2020-12-17 NOTE — Anesthesia Procedure Notes (Signed)
Procedure Name: LMA Insertion Date/Time: 12/17/2020 10:07 AM Performed by: Myna Bright, CRNA Pre-anesthesia Checklist: Patient identified, Emergency Drugs available, Patient being monitored and Suction available Patient Re-evaluated:Patient Re-evaluated prior to induction Oxygen Delivery Method: Circle system utilized Preoxygenation: Pre-oxygenation with 100% oxygen Induction Type: IV induction Ventilation: Mask ventilation without difficulty LMA: LMA inserted LMA Size: 4.0 Tube type: Oral Number of attempts: 1 Placement Confirmation: positive ETCO2 and breath sounds checked- equal and bilateral Tube secured with: Tape Dental Injury: Teeth and Oropharynx as per pre-operative assessment

## 2020-12-17 NOTE — Anesthesia Preprocedure Evaluation (Addendum)
Anesthesia Evaluation  Patient identified by MRN, date of birth, ID band Patient awake    Reviewed: Allergy & Precautions, NPO status , Patient's Chart, lab work & pertinent test results  Airway Mallampati: I  TM Distance: >3 FB Neck ROM: Full    Dental  (+) Teeth Intact, Poor Dentition, Chipped, Missing, Dental Advisory Given   Pulmonary shortness of breath and with exertion, Patient abstained from smoking., former smoker,    Pulmonary exam normal        Cardiovascular hypertension, + CAD, + Past MI, + Cardiac Stents (11/19 on Plavix) and + DOE   Rhythm:Regular Rate:Normal     Neuro/Psych negative neurological ROS  negative psych ROS   GI/Hepatic negative GI ROS, Neg liver ROS,   Endo/Other  negative endocrine ROS  Renal/GU negative Renal ROS Bladder dysfunction  Bladder Ca    Musculoskeletal negative musculoskeletal ROS (+)   Abdominal (+)  Abdomen: soft. Bowel sounds: normal.  Peds  Hematology negative hematology ROS (+)   Anesthesia Other Findings   Reproductive/Obstetrics                            Anesthesia Physical  Anesthesia Plan  ASA: 3  Anesthesia Plan: General   Post-op Pain Management: Ofirmev IV (intra-op)   Induction: Intravenous  PONV Risk Score and Plan: 1 and Ondansetron, Dexamethasone and Treatment may vary due to age or medical condition  Airway Management Planned: LMA  Additional Equipment: None  Intra-op Plan:   Post-operative Plan: Extubation in OR  Informed Consent: I have reviewed the patients History and Physical, chart, labs and discussed the procedure including the risks, benefits and alternatives for the proposed anesthesia with the patient or authorized representative who has indicated his/her understanding and acceptance.     Dental advisory given  Plan Discussed with: CRNA and Anesthesiologist  Anesthesia Plan Comments: (   ECHO  11/19: Study Conclusions  - Left ventricle: The cavity size was normal. Wall thickness was  increased in a pattern of moderate LVH. Systolic function was  normal. The estimated ejection fraction was in the range of 60%  to 65%. Inferobasal hypokinesis. The study is not technically  sufficient to allow evaluation of LV diastolic function.  - Mitral valve: Mildly thickened leaflets . There was trivial  regurgitation.  - Left atrium: The atrium was normal in size.  - Inferior vena cava: The vessel was dilated. The respirophasic  diameter changes were blunted (< 50%), consistent with elevated  central venous pressure.   Impressions:  - LVEF 60-65%, inferobasal hypokinesis, moderate LVH, trivial MR,  normal LA size, dilated IVC. )        Anesthesia Quick Evaluation

## 2020-12-17 NOTE — Transfer of Care (Signed)
Immediate Anesthesia Transfer of Care Note  Patient: Darius [name]  Procedure(s) Performed: TRANSURETHRAL RESECTION OF BLADDER TUMOR WITH GEMCITABINE IN PACU (Bladder)  Patient Location: PACU  Anesthesia Type:General  Level of Consciousness: sedated and patient cooperative  Airway & Oxygen Therapy: Patient Spontanous Breathing and Patient connected to nasal cannula oxygen  Post-op Assessment: Report given to RN, Post -op Vital signs reviewed and stable and Patient moving all extremities  Post vital signs: Reviewed and stable  Last Vitals:  Vitals Value Taken Time  BP 134/81 12/17/20 1040  Temp    Pulse 77 12/17/20 1044  Resp 17 12/17/20 1044  SpO2 96 % 12/17/20 1044  Vitals shown include unvalidated device data.  Last Pain:  Vitals:   12/17/20 0826  TempSrc: Oral  PainSc: 0-No pain      Patients Stated Pain Goal: 5 (18/36/72 5500)  Complications: No notable events documented.

## 2020-12-18 ENCOUNTER — Encounter (HOSPITAL_BASED_OUTPATIENT_CLINIC_OR_DEPARTMENT_OTHER): Payer: Self-pay | Admitting: Urology

## 2020-12-19 LAB — SURGICAL PATHOLOGY

## 2021-01-01 ENCOUNTER — Other Ambulatory Visit (HOSPITAL_COMMUNITY): Payer: Self-pay

## 2021-01-22 ENCOUNTER — Ambulatory Visit: Payer: Self-pay | Admitting: Student

## 2021-01-22 ENCOUNTER — Other Ambulatory Visit (HOSPITAL_COMMUNITY): Payer: Self-pay

## 2021-01-22 VITALS — BP 132/74 | HR 85 | Temp 98.4°F | Ht 68.0 in | Wt 241.0 lb

## 2021-01-22 DIAGNOSIS — C672 Malignant neoplasm of lateral wall of bladder: Secondary | ICD-10-CM

## 2021-01-22 DIAGNOSIS — Z1211 Encounter for screening for malignant neoplasm of colon: Secondary | ICD-10-CM

## 2021-01-22 DIAGNOSIS — Z0000 Encounter for general adult medical examination without abnormal findings: Secondary | ICD-10-CM

## 2021-01-22 DIAGNOSIS — I10 Essential (primary) hypertension: Secondary | ICD-10-CM

## 2021-01-22 DIAGNOSIS — I7121 Aneurysm of the ascending aorta, without rupture: Secondary | ICD-10-CM

## 2021-01-22 DIAGNOSIS — Z87891 Personal history of nicotine dependence: Secondary | ICD-10-CM

## 2021-01-22 MED ORDER — METOPROLOL TARTRATE 25 MG PO TABS
25.0000 mg | ORAL_TABLET | Freq: Two times a day (BID) | ORAL | 11 refills | Status: DC
  Filled 2021-01-22: qty 60, 30d supply, fill #0
  Filled 2021-02-22: qty 60, 30d supply, fill #1
  Filled 2021-03-25: qty 60, 30d supply, fill #2
  Filled 2021-04-18 – 2021-04-23 (×2): qty 60, 30d supply, fill #3
  Filled 2021-05-20: qty 60, 30d supply, fill #4
  Filled 2021-06-17: qty 60, 30d supply, fill #5
  Filled 2021-07-17: qty 60, 30d supply, fill #6
  Filled 2021-08-15: qty 60, 30d supply, fill #7
  Filled 2021-09-12: qty 60, 30d supply, fill #8
  Filled 2021-10-15: qty 60, 30d supply, fill #9
  Filled 2021-11-18: qty 60, 30d supply, fill #10
  Filled 2021-12-17: qty 60, 30d supply, fill #11

## 2021-01-22 MED ORDER — LOSARTAN POTASSIUM 25 MG PO TABS
25.0000 mg | ORAL_TABLET | Freq: Every day | ORAL | 3 refills | Status: DC
  Filled 2021-01-22: qty 90, 90d supply, fill #0
  Filled 2021-02-05: qty 30, 30d supply, fill #0
  Filled 2021-04-18: qty 30, 30d supply, fill #1
  Filled 2021-05-20: qty 30, 30d supply, fill #2
  Filled 2021-06-17: qty 30, 30d supply, fill #3

## 2021-01-22 NOTE — Patient Instructions (Addendum)
It was a pleasure seeing you in clinic. Today we discussed:   Blood pressure: Please continue losartan 25 mg daily, please also start taking metoprolol 25 mg twice daily, this will also help prevent the thoracic aortic aneurysm from getting larger Follow up in 2 weeks to recheck your blood pressure, Please bring your BP cuff to your next visit   Colon cancer screening: Please bring back the sample when you have time and I will call you with the results.  If you have any questions or concerns, please call our clinic at [phone] between 9am-5pm and after hours call [phone] and ask for the internal medicine resident on call. If you feel you are having a medical emergency please call 911.   Thank you, we look forward to helping you remain healthy!

## 2021-01-23 ENCOUNTER — Encounter: Payer: Self-pay | Admitting: Student

## 2021-01-23 DIAGNOSIS — I712 Thoracic aortic aneurysm, without rupture, unspecified: Secondary | ICD-10-CM | POA: Insufficient documentation

## 2021-01-23 NOTE — Progress Notes (Signed)
° °  CC: BP follow up  HPI:  [name] is a 66 y.o. male with past medical history presents for follow up of BP. Please refer to problem based charting for further details and assessment and plan of current problem and chronic medical conditions.   Past Medical History:  Diagnosis Date   AAA (abdominal aortic aneurysm)    followed by cardiologist--- last CTA 11-02-2020,  66mm   Behcet's disease ([place])    per cardiologist note   Bladder cancer [facility])    urologist--- dr Diona Fanti---- first dx 12/ 2019 s/p turbt   CAD (coronary artery disease) cardiologist--- dr h. Tamala Julian   hx inferior STEMI  11-30-2017  s/p  PCI with DES to proxRCA;  normal echo 11-30-2017 in epic   DOE (dyspnea on exertion)    per pt a little sob w/ stairs but recovers quickly, stated this is usual   Emphysema/COPD [facility])    History of 2019 novel coronavirus disease (COVID-19) 09/2019   per pt had positive home test approx. 09/ 2021 with mild symptoms that resolved   History of ST elevation myocardial infarction (STEMI) 11/30/2017   s/p  pci w/ des   Hypertension    followed by pcp   Nocturia    S/P drug eluting coronary stent placement 11/30/2017   DES x1 to proxRCA   Wears contact lenses    Review of Systems:  Negative as per HPI  Physical Exam:  Vitals:   01/22/21 1447 01/22/21 1535  BP: 136/81 132/74  Pulse: 90 85  Temp: 98.4 F (36.9 C)   TempSrc: Oral   SpO2: 98%   Weight: 241 lb (109.3 kg)   Height: 5\' 8"  (1.727 m)    Physical Exam Constitutional:      General: He is not in acute distress.    Appearance: Normal appearance.  HENT:     Head: Normocephalic and atraumatic.     Mouth/Throat:     Mouth: Mucous membranes are moist.     Pharynx: Oropharynx is clear.  Eyes:     Extraocular Movements: Extraocular movements intact.     Pupils: Pupils are equal, round, and reactive to light.  Cardiovascular:     Rate and Rhythm: Normal rate and regular rhythm.     Pulses: Normal pulses.   Pulmonary:     Effort: Pulmonary effort is normal.     Breath sounds: Normal breath sounds. No wheezing.  Abdominal:     General: Abdomen is flat. Bowel sounds are normal. There is no distension.     Palpations: Abdomen is soft.     Tenderness: There is no abdominal tenderness.  Musculoskeletal:        General: Normal range of motion.     Right lower leg: No edema.     Left lower leg: No edema.  Skin:    General: Skin is warm and dry.     Capillary Refill: Capillary refill takes less than 2 seconds.     Findings: No rash.  Neurological:     General: No focal deficit present.     Mental Status: He is alert and oriented to person, place, and time.     Assessment & Plan:   See Encounters Tab for problem based charting.  Patient discussed with Dr.  Cain Sieve

## 2021-01-23 NOTE — Assessment & Plan Note (Signed)
Patient states he got a BP cuff and has been having high readings with SBP in 160s. Asymptomatic at that time. States he was seated and not in an distress during elevated measurements.  He reports he is taking losartan 25 mg daily.   BP today 136/81 and 132/74 on repeat which is higher that prior office readings. Given history of thoracic aortic aneurysm goal SBP between 105-120. Discussed how to take a BP at home and advised he with arm at heart level with feet uncrossed and on the ground. Asked patient to keep a BP log and bring cuff to next visit.   Continue losartan 25 mg daily Start metoprolol tartrate 25 mg twice daily, consider switching to coreg if BP remains elevated Follow up in 2 weeks  Patient to bring log and BP cuff to next visit

## 2021-01-23 NOTE — Assessment & Plan Note (Signed)
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 Underwent resection and instillation of gemcitabine on 12/17/2020. Tolerated procedure well. Denies hematuria. Has stopped smoking in the last few weeks. Has urology follow up on 01/23/2021.  Plan Urology follow up on 01/23/2021

## 2021-01-23 NOTE — Progress Notes (Signed)
Internal Medicine Clinic Attending  Case discussed with Dr. Lisabeth Devoid  At the time of the visit.  We reviewed the residents history and exam and pertinent patient test results.  I agree with the assessment, diagnosis, and plan of care documented in the residents note.   He has an asymptomatic thoracic aortic aneurism that is stable at 17mm. Will need annual imaging (I suggest CTA) to monitor and aggressive BP control with goal SBP<120. Will start BB today.

## 2021-01-23 NOTE — Assessment & Plan Note (Signed)
Patient has stopped smoking in the last few weeks after third recurrence of bladder carcinoma. State he does have occasional cravings but is motivated to stop smoking. Encouraged him to continue with cessation.

## 2021-01-23 NOTE — Assessment & Plan Note (Addendum)
History of asymptomatic thoracic aortic aneurysm. Found to have increase in size from 66mm to 47 mm on echo on 05/03/2020. Further evaluated with CTA on 11/02/2020 measuring 85mm. Appears stable. Patient without CP in office today. Symmetric bilateral radial pulses. SBP >120. Will start on metoprolol 25 mg twice daily.

## 2021-01-23 NOTE — Assessment & Plan Note (Signed)
FOBT for colon cancer screening

## 2021-02-05 ENCOUNTER — Other Ambulatory Visit (HOSPITAL_COMMUNITY): Payer: Self-pay

## 2021-02-12 ENCOUNTER — Other Ambulatory Visit (HOSPITAL_COMMUNITY): Payer: Self-pay

## 2021-02-22 ENCOUNTER — Other Ambulatory Visit (HOSPITAL_COMMUNITY): Payer: Self-pay

## 2021-03-15 ENCOUNTER — Other Ambulatory Visit (HOSPITAL_COMMUNITY): Payer: Self-pay

## 2021-03-25 ENCOUNTER — Other Ambulatory Visit (HOSPITAL_COMMUNITY): Payer: Self-pay

## 2021-04-18 ENCOUNTER — Other Ambulatory Visit (HOSPITAL_COMMUNITY): Payer: Self-pay

## 2021-04-23 ENCOUNTER — Other Ambulatory Visit (HOSPITAL_COMMUNITY): Payer: Self-pay

## 2021-05-20 ENCOUNTER — Other Ambulatory Visit (HOSPITAL_COMMUNITY): Payer: Self-pay

## 2021-05-30 ENCOUNTER — Other Ambulatory Visit: Payer: Self-pay | Admitting: Cardiology

## 2021-05-30 DIAGNOSIS — I10 Essential (primary) hypertension: Secondary | ICD-10-CM

## 2021-06-17 ENCOUNTER — Other Ambulatory Visit (HOSPITAL_COMMUNITY): Payer: Self-pay

## 2021-06-27 ENCOUNTER — Other Ambulatory Visit (HOSPITAL_COMMUNITY): Payer: Self-pay

## 2021-06-29 ENCOUNTER — Other Ambulatory Visit: Payer: Self-pay | Admitting: Cardiology

## 2021-06-29 DIAGNOSIS — I10 Essential (primary) hypertension: Secondary | ICD-10-CM

## 2021-07-17 ENCOUNTER — Other Ambulatory Visit (HOSPITAL_COMMUNITY): Payer: Self-pay

## 2021-07-17 ENCOUNTER — Other Ambulatory Visit: Payer: Self-pay | Admitting: Student

## 2021-07-17 DIAGNOSIS — I10 Essential (primary) hypertension: Secondary | ICD-10-CM

## 2021-07-17 MED ORDER — LOSARTAN POTASSIUM 25 MG PO TABS
25.0000 mg | ORAL_TABLET | Freq: Every day | ORAL | 3 refills | Status: DC
  Filled 2021-07-17: qty 30, 30d supply, fill #0
  Filled 2021-08-15: qty 30, 30d supply, fill #1
  Filled 2021-09-12: qty 30, 30d supply, fill #2
  Filled 2021-10-15: qty 30, 30d supply, fill #3

## 2021-07-18 ENCOUNTER — Other Ambulatory Visit (HOSPITAL_COMMUNITY): Payer: Self-pay

## 2021-07-30 ENCOUNTER — Other Ambulatory Visit (HOSPITAL_COMMUNITY): Payer: Self-pay

## 2021-08-15 ENCOUNTER — Other Ambulatory Visit (HOSPITAL_COMMUNITY): Payer: Self-pay

## 2021-09-02 ENCOUNTER — Other Ambulatory Visit (HOSPITAL_COMMUNITY): Payer: Self-pay

## 2021-09-12 ENCOUNTER — Other Ambulatory Visit (HOSPITAL_COMMUNITY): Payer: Self-pay

## 2021-10-04 ENCOUNTER — Other Ambulatory Visit (HOSPITAL_COMMUNITY): Payer: Self-pay

## 2021-10-04 ENCOUNTER — Other Ambulatory Visit: Payer: Self-pay | Admitting: Student

## 2021-10-04 ENCOUNTER — Other Ambulatory Visit: Payer: Self-pay

## 2021-10-04 DIAGNOSIS — I25118 Atherosclerotic heart disease of native coronary artery with other forms of angina pectoris: Secondary | ICD-10-CM

## 2021-10-05 MED FILL — Isosorbide Mononitrate Tab ER 24HR 60 MG: ORAL | 90 days supply | Qty: 90 | Fill #0 | Status: CN

## 2021-10-07 ENCOUNTER — Other Ambulatory Visit (HOSPITAL_COMMUNITY): Payer: Self-pay

## 2021-10-08 ENCOUNTER — Other Ambulatory Visit (HOSPITAL_COMMUNITY): Payer: Self-pay

## 2021-10-12 ENCOUNTER — Other Ambulatory Visit: Payer: Self-pay | Admitting: Student

## 2021-10-12 DIAGNOSIS — I25118 Atherosclerotic heart disease of native coronary artery with other forms of angina pectoris: Secondary | ICD-10-CM

## 2021-10-15 ENCOUNTER — Other Ambulatory Visit (HOSPITAL_COMMUNITY): Payer: Self-pay

## 2021-10-15 ENCOUNTER — Other Ambulatory Visit: Payer: Self-pay

## 2021-10-15 ENCOUNTER — Other Ambulatory Visit: Payer: Self-pay | Admitting: Student

## 2021-10-15 DIAGNOSIS — I25118 Atherosclerotic heart disease of native coronary artery with other forms of angina pectoris: Secondary | ICD-10-CM

## 2021-10-16 ENCOUNTER — Other Ambulatory Visit (HOSPITAL_COMMUNITY): Payer: Self-pay

## 2021-10-16 ENCOUNTER — Other Ambulatory Visit: Payer: Self-pay | Admitting: Internal Medicine

## 2021-10-16 ENCOUNTER — Telehealth: Payer: Self-pay

## 2021-10-16 DIAGNOSIS — I25118 Atherosclerotic heart disease of native coronary artery with other forms of angina pectoris: Secondary | ICD-10-CM

## 2021-10-16 MED ORDER — ATORVASTATIN CALCIUM 80 MG PO TABS
80.0000 mg | ORAL_TABLET | Freq: Every day | ORAL | 3 refills | Status: DC
  Filled 2021-10-16: qty 30, 30d supply, fill #0
  Filled 2021-11-12: qty 30, 30d supply, fill #1
  Filled 2021-11-18 – 2021-12-11 (×2): qty 30, 30d supply, fill #2
  Filled 2022-01-09: qty 30, 30d supply, fill #3
  Filled 2022-02-10: qty 30, 30d supply, fill #4
  Filled 2022-03-10: qty 30, 30d supply, fill #5
  Filled 2022-04-08: qty 90, 90d supply, fill #6
  Filled 2022-07-06: qty 90, 90d supply, fill #7

## 2021-10-16 NOTE — Telephone Encounter (Signed)
Requesting to speak with nurse about getting medication refill. Please call pt back.

## 2021-10-16 NOTE — Telephone Encounter (Signed)
Pt is aware he has an appt on 10/13; currently out of medication.

## 2021-10-16 NOTE — Telephone Encounter (Signed)
[facility] stated rx had expired; need need new rx for Atorvastatin. Sure script sent per pharmacy. Thanks

## 2021-10-23 ENCOUNTER — Inpatient Hospital Stay: Admission: RE | Admit: 2021-10-23 | Payer: Self-pay | Source: Ambulatory Visit

## 2021-10-25 ENCOUNTER — Other Ambulatory Visit: Payer: Self-pay

## 2021-10-25 ENCOUNTER — Other Ambulatory Visit (HOSPITAL_COMMUNITY): Payer: Self-pay

## 2021-10-25 ENCOUNTER — Encounter: Payer: Self-pay | Admitting: Internal Medicine

## 2021-10-25 ENCOUNTER — Ambulatory Visit: Payer: Self-pay | Admitting: Internal Medicine

## 2021-10-25 VITALS — BP 125/76 | HR 61 | Temp 97.5°F | Ht 69.0 in | Wt 239.3 lb

## 2021-10-25 DIAGNOSIS — I7121 Aneurysm of the ascending aorta, without rupture: Secondary | ICD-10-CM

## 2021-10-25 DIAGNOSIS — I25118 Atherosclerotic heart disease of native coronary artery with other forms of angina pectoris: Secondary | ICD-10-CM

## 2021-10-25 DIAGNOSIS — C672 Malignant neoplasm of lateral wall of bladder: Secondary | ICD-10-CM

## 2021-10-25 DIAGNOSIS — I10 Essential (primary) hypertension: Secondary | ICD-10-CM

## 2021-10-25 DIAGNOSIS — Z87891 Personal history of nicotine dependence: Secondary | ICD-10-CM

## 2021-10-25 MED ORDER — LOSARTAN POTASSIUM 25 MG PO TABS
25.0000 mg | ORAL_TABLET | Freq: Every day | ORAL | 3 refills | Status: DC
  Filled 2021-10-25: qty 90, 90d supply, fill #0
  Filled 2021-11-18: qty 30, 30d supply, fill #0
  Filled 2021-12-17: qty 30, 30d supply, fill #1
  Filled 2022-01-14: qty 30, 30d supply, fill #2
  Filled 2022-02-14: qty 30, 30d supply, fill #3
  Filled 2022-03-10: qty 30, 30d supply, fill #4
  Filled 2022-04-08: qty 90, 90d supply, fill #5
  Filled 2022-07-06: qty 90, 90d supply, fill #6

## 2021-10-25 MED ORDER — VARENICLINE TARTRATE 1 MG PO TABS: 1.0000 mg | ORAL_TABLET | Freq: Two times a day (BID) | ORAL | 1 refills | Status: DC

## 2021-10-25 MED ORDER — NITROGLYCERIN 0.4 MG SL SUBL
0.4000 mg | SUBLINGUAL_TABLET | SUBLINGUAL | 2 refills | Status: DC | PRN
  Filled 2021-10-25: qty 25, 8d supply, fill #0

## 2021-10-25 MED FILL — Isosorbide Mononitrate Tab ER 24HR 60 MG: ORAL | 30 days supply | Qty: 30 | Fill #0 | Status: AC

## 2021-10-25 NOTE — Progress Notes (Unsigned)
   CC: overdue follow up  HPI:[name] is a 66 y.o. male who presents for evaluation of aneurysm. Please see individual problem based A/P for details.   Patient continues to smoke 1/2ppd. Has tried patches in past but still had cravings. Wanting to give Chantix a try. Asymptomatic today. No chest pain, dizziness, lightheadedness, shob.  Depression, PHQ-9: Based on the patients  [place] Visit from 01/22/2021 in [place]  PHQ-9 Total Score 0      score we have .  Past Medical History:  Diagnosis Date   AAA (abdominal aortic aneurysm) ([place])    followed by cardiologist--- last CTA 11-02-2020,  29m   Behcet's disease ([place]    per cardiologist note   Bladder cancer ([facility]    urologist--- dr dDiona Fanti--- first dx 12/ 2019 s/p turbt   CAD (coronary artery disease) cardiologist--- dr h. sTamala Julian  hx inferior STEMI  11-30-2017  s/p  PCI with DES to proxRCA;  normal echo 11-30-2017 in epic   DOE (dyspnea on exertion)    per pt a little sob w/ stairs but recovers quickly, stated this is usual   Emphysema/COPD ([facility]    History of 2019 novel coronavirus disease (COVID-19) 09/2019   per pt had positive home test approx. 09/ 2021 with mild symptoms that resolved   History of ST elevation myocardial infarction (STEMI) 11/30/2017   s/p  pci w/ des   Hypertension    followed by pcp   Nocturia    S/P drug eluting coronary stent placement 11/30/2017   DES x1 to proxRCA   Wears contact lenses    Review of Systems:   See HPI  Physical Exam: Vitals:   10/25/21 0926 10/25/21 0934  BP: (!) 140/60 125/76  Pulse: 60 61  Temp: (!) 97.5 F (36.4 C)   TempSrc: Oral   SpO2: 100%   Weight: 239 lb 4.8 oz (108.5 kg)   Height: '5\' 9"'$  (1.753 m)      General: NAD HEENT: Conjunctiva nl , antiicteric sclerae, moist mucous membranes, no exudate or erythema Cardiovascular: Normal rate, regular rhythm.  No murmurs, rubs, or gallops Pulmonary : Equal  breath sounds, No wheezes, rales, or rhonchi Abdominal: soft, nontender,  bowel sounds present Ext: No edema in lower extremities, no tenderness to palpation of lower extremities.   Assessment & Plan:   See Encounters Tab for problem based charting.  It is critical that pt stop smoking given his hx of stemi and aortic aneurysm.  - Will try chantix.   Continue current regimen for BP control. - CTA  Patient discussed with Dr. VEvette Doffing

## 2021-10-25 NOTE — Patient Instructions (Signed)
Dear [name],  Thank you for trusting [place] with your care today. We talked about your blood pressure, aneurysm, bladder cancer, and smoking. Smoking plays an important role in all of these conditions. To help you quit, I would like for you to try Chantix. I have also ordered another imaging test of the aneurysm. Please return in 3 months for a follow up. Please call our office if you need a refill of the chantix before then.  For the chantix, follow the dose schedule below:  Initial: Days 1 to 3: 0.5 mg once daily. Days 4 to 7: 0.5 mg twice daily. Maintenance (day 8 and later): 1 mg twice daily

## 2021-10-26 ENCOUNTER — Encounter: Payer: Self-pay | Admitting: Internal Medicine

## 2021-10-26 NOTE — Assessment & Plan Note (Signed)
Patient continues to smoke. Asymptomatic.   No murmurs. Distal pulses intact and symmetric.   Continue current regimen for BP control. - CTA

## 2021-10-26 NOTE — Assessment & Plan Note (Addendum)
Patient continues to smoke 1/2ppd. Has tried patches in past but still had cravings. Wanting to give Chantix a try.  It is critical that pt stop smoking given his hx of stemi and aortic aneurysm.  - Will try chantix.

## 2021-10-26 NOTE — Assessment & Plan Note (Signed)
Follows with urology. Reports he has appt set up in December.

## 2021-10-28 NOTE — Progress Notes (Signed)
Internal Medicine Clinic Attending ° °Case discussed with Dr. Gawaluck  At the time of the visit.  We reviewed the resident’s history and exam and pertinent patient test results.  I agree with the assessment, diagnosis, and plan of care documented in the resident’s note.  °

## 2021-10-31 ENCOUNTER — Encounter: Payer: Self-pay | Admitting: Student

## 2021-11-12 ENCOUNTER — Other Ambulatory Visit (HOSPITAL_COMMUNITY): Payer: Self-pay

## 2021-11-13 ENCOUNTER — Ambulatory Visit (HOSPITAL_COMMUNITY)
Admission: RE | Admit: 2021-11-13 | Discharge: 2021-11-13 | Disposition: A | Discharge status: Home / Self Care | Payer: Self-pay | Source: Ambulatory Visit | Attending: Internal Medicine | Admitting: Internal Medicine

## 2021-11-13 DIAGNOSIS — I7121 Aneurysm of the ascending aorta, without rupture: Secondary | ICD-10-CM | POA: Insufficient documentation

## 2021-11-13 MED ORDER — IOHEXOL 350 MG/ML SOLN
80.0000 mL | Freq: Once | INTRAVENOUS | Status: AC | PRN
  Administered 2021-11-13: 80 mL via INTRAVENOUS

## 2021-11-15 IMAGING — CT CT ANGIO CHEST
3 of 8 series · 18 of 46 positions shown · IV contrast (OMNIPAQUE 350)
Comparison: 01/18/2018 CT without contrast

CLINICAL DATA: Aortic atherosclerosis and dilatation by echo,
concern for aneurysm

EXAM:
CT ANGIOGRAPHY CHEST WITH CONTRAST
TECHNIQUE: Multidetector CT imaging of the chest was performed using the
standard protocol during bolus administration of intravenous
contrast. Multiplanar CT image reconstructions and MIPs were
obtained to evaluate the vascular anatomy.
CONTRAST:  100mL OMNIPAQUE IOHEXOL 350 MG/ML SOLN

[Series 4: aorta 3.0 bf37 2 · axial · 0.82mm/px · z∈[-348,-44]mm · 13 of 119 slices shown]
[im 9/119  lung]
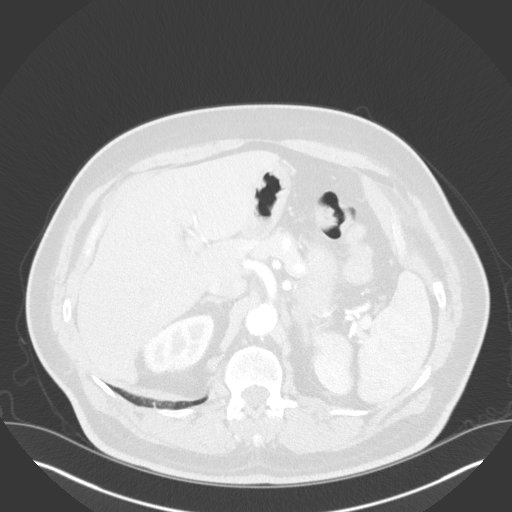
[im 17/119  soft-tissue]
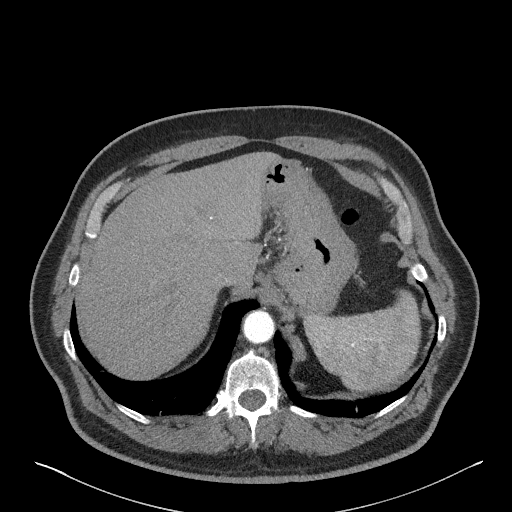
[im 26/119  lung]
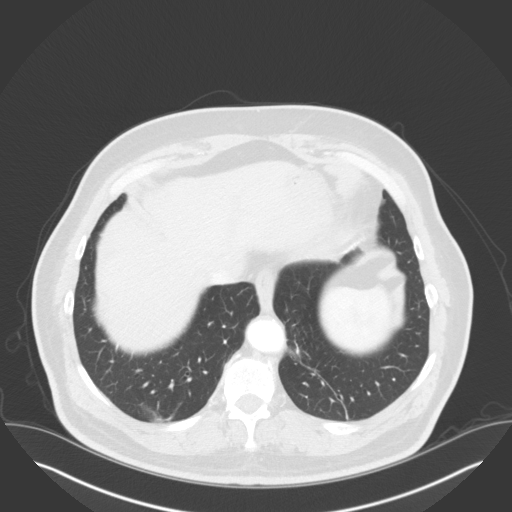
[im 34/119  soft-tissue]
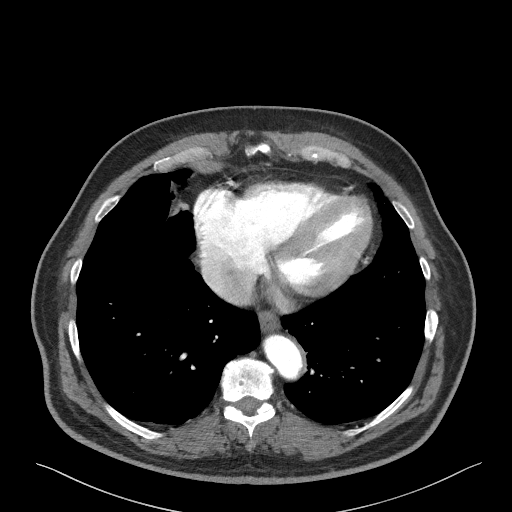
[im 43/119  lung]
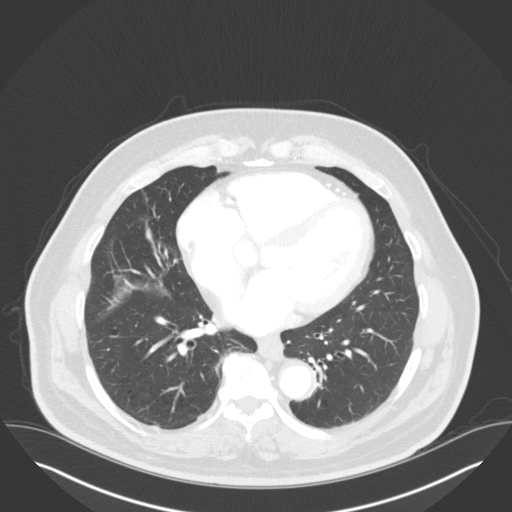
[im 51/119  soft-tissue]
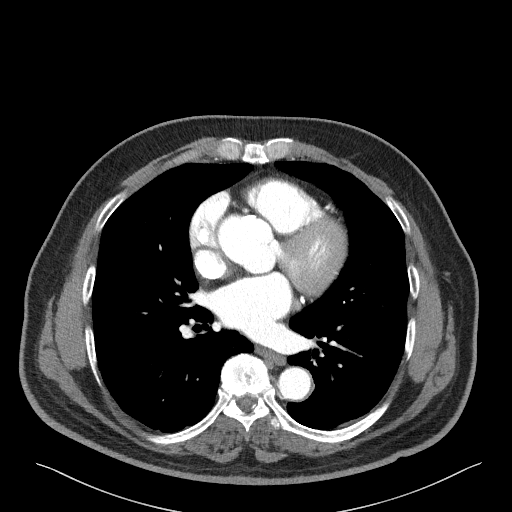
[im 60/119  lung]
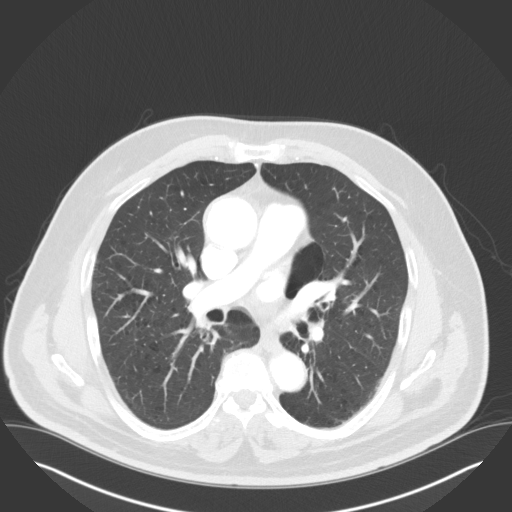
[im 68/119  soft-tissue]
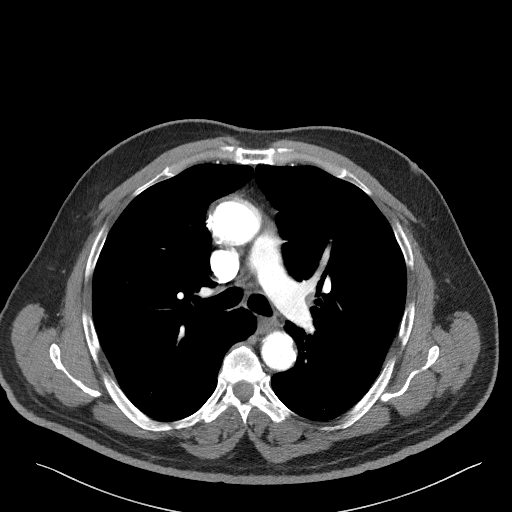
[im 76/119  lung]
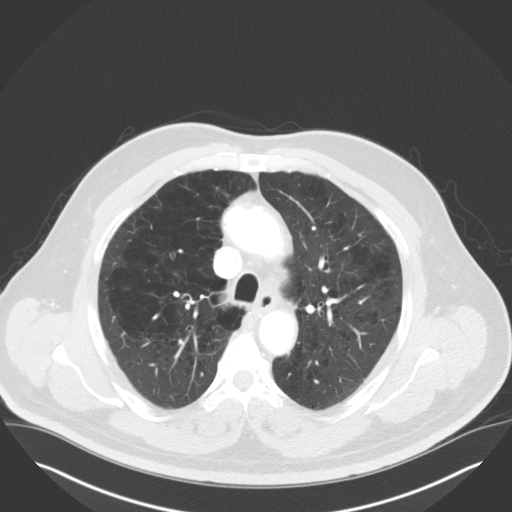
[im 85/119  soft-tissue]
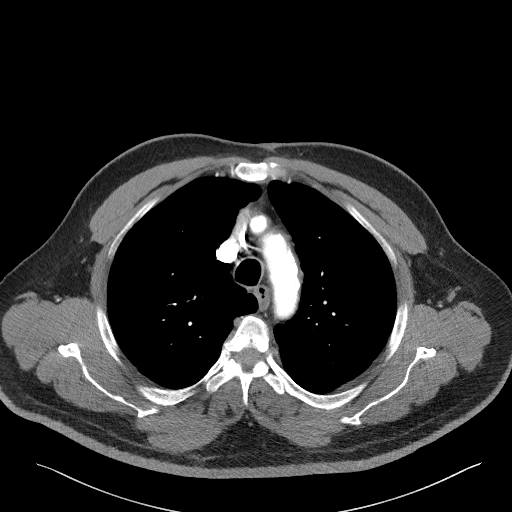
[im 93/119  lung]
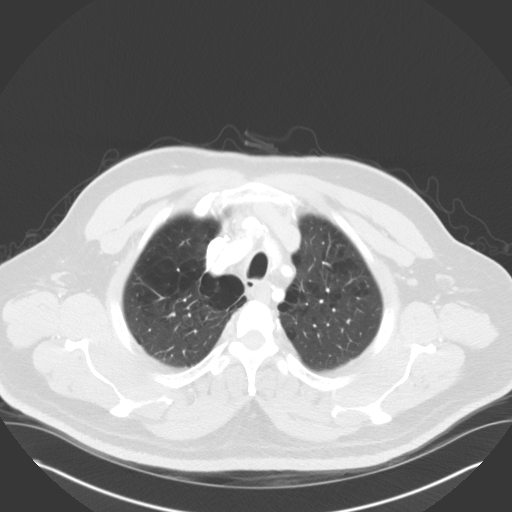
[im 102/119  soft-tissue]
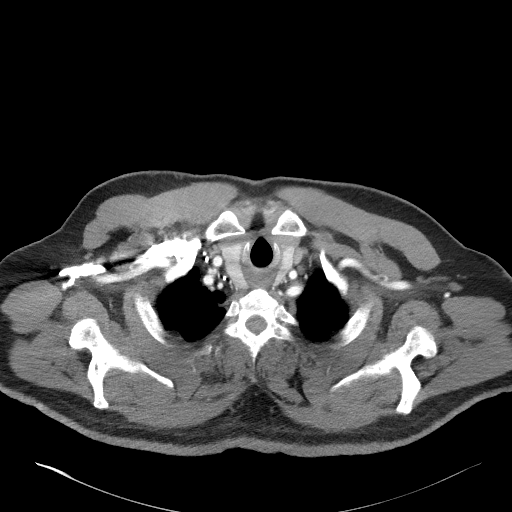
[im 110/119  lung]
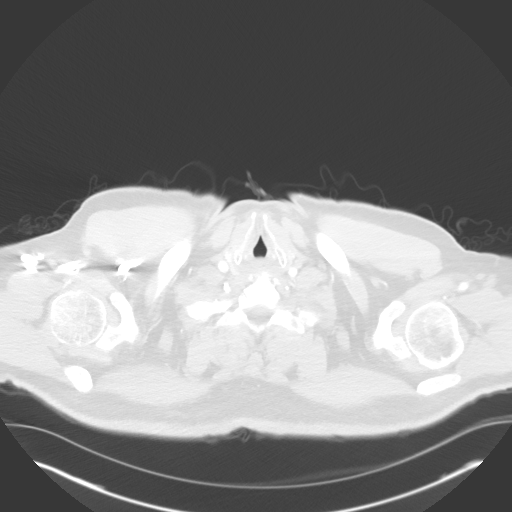

[Series 5: lung · axial · 0.82mm/px · z∈[-348,-296]mm · 2 of 119 slices shown]
[im 9/119  soft-tissue]
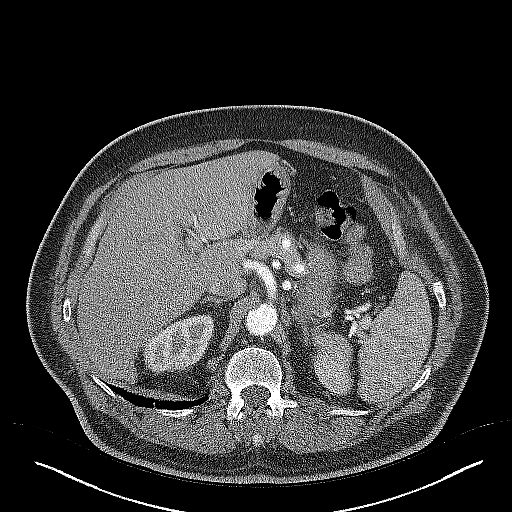
[im 26/119  soft-tissue]
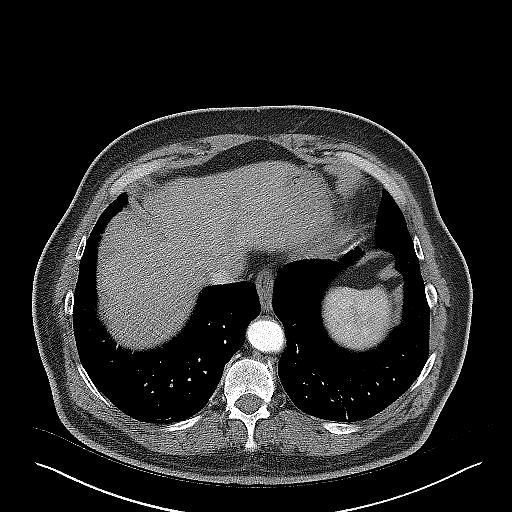

[Series 7: coronals · coronal · 0.68mm/px · 3 of 137 slices shown]
[im 35/137  soft-tissue]
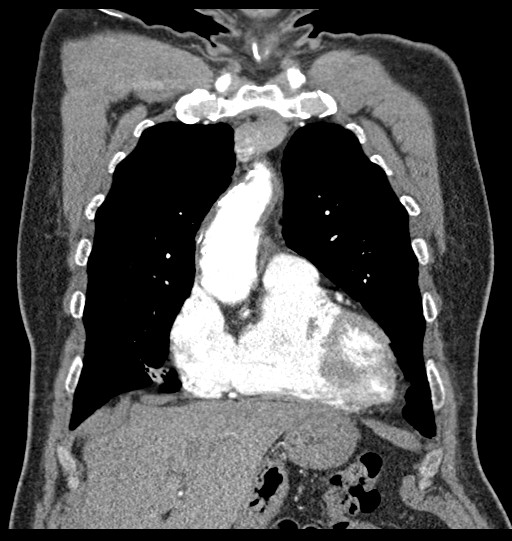
[im 69/137  soft-tissue]
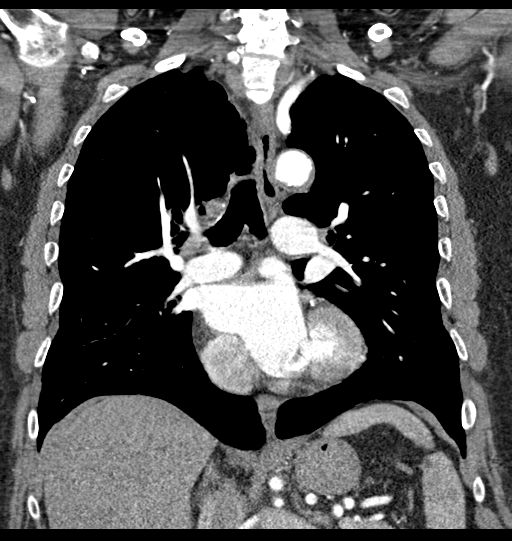
[im 103/137  soft-tissue]
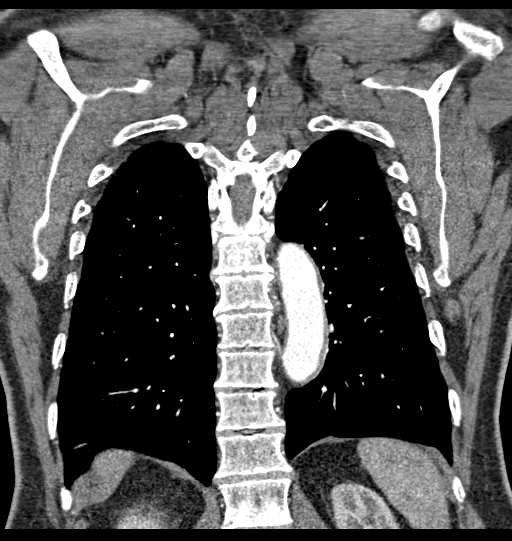

[18 of 46 positions shown; findings below may reference images not displayed]

FINDINGS: Cardiovascular: Aortic moderately severe atherosclerotic changes
with irregular hypodense aortic wall thickening and scattered areas
of wall calcification.

Sinus of Valsalva: 48 mm

Sinotubular junction: 33 mm

Ascending thoracic aorta: 42 mm

Remainder of the aorta is normal in caliber.

No acute aortic dissection. No mediastinal hemorrhage or hematoma.
Patent arch anatomy. Left vertebral artery originates directly off
the aorta, normal variant.

Central pulmonary arteries are normal in caliber. No significant
filling defect or pulmonary embolus by CTA.

Heart is enlarged. Native coronary atherosclerosis noted. No
pericardial effusion.

Central venous structures are patent.  No Tiger disease.

Mediastinum/Nodes: No enlarged mediastinal, hilar, or axillary lymph
nodes. Thyroid gland, trachea, and esophagus demonstrate no
significant findings.

Lungs/Pleura: Upper lobe predominant moderate to severe pulmonary
emphysema pattern, worse in the right lung with bullous emphysema
noted. Biapical pleural-parenchymal scarring. Bibasilar, inferior
right middle lobe, and lingula bandlike opacities favored to
represent scarring or mild atelectasis.

No acute airspace process, collapse or consolidation. Negative for
edema, interstitial disease, pleural abnormality, effusion, or
pneumothorax.

Upper Abdomen: Ill-defined hyperenhancing area along the caudate
lobe, favored to represent perfusion abnormality versus transit
hepatic attenuation defect.

No acute upper abdominal finding. Abdominal aortic atherosclerosis
noted. Celiac, SMA and renal vasculature all appear patent.

Musculoskeletal: Degenerative changes noted of the spine.

Review of the MIP images confirms the above findings.
IMPRESSION: 42 mm ascending thoracic aortic aneurysm.

Recommend annual imaging followup by CTA or MRA. This recommendation
follows 3909 ACCF/AHA/AATS/ACR/ASA/SCA/ANDAYA/JUMPER/TIGER/CINEUS Guidelines
for the Diagnosis and Management of Patients with Thoracic Aortic
Disease. Circulation. 3909; 121: [id number]. Aortic aneurysm NOS
(9Q6PV-BCO.Z)

No other acute intrathoracic vascular or nonvascular finding.

Native coronary atherosclerosis

Bullous emphysema

Aortic Atherosclerosis (9Q6PV-QMQ.Q) and Emphysema (9Q6PV-8Y5.G).

Aortic aneurysm NOS (9Q6PV-BCO.Z).

## 2021-11-18 ENCOUNTER — Other Ambulatory Visit: Payer: Self-pay | Admitting: Internal Medicine

## 2021-11-18 ENCOUNTER — Other Ambulatory Visit (HOSPITAL_COMMUNITY): Payer: Self-pay

## 2021-11-21 ENCOUNTER — Encounter: Payer: Self-pay | Admitting: Internal Medicine

## 2021-11-21 NOTE — Progress Notes (Signed)
CTA showing no significant change in aneurysm. No intervention necessary at this time. Will plan to repeat imaging in 1 year.

## 2021-11-29 ENCOUNTER — Other Ambulatory Visit (HOSPITAL_COMMUNITY): Payer: Self-pay

## 2021-11-29 MED FILL — Isosorbide Mononitrate Tab ER 24HR 60 MG: ORAL | 30 days supply | Qty: 30 | Fill #1 | Status: AC

## 2021-12-02 ENCOUNTER — Encounter: Payer: Self-pay | Admitting: Student

## 2021-12-11 ENCOUNTER — Other Ambulatory Visit (HOSPITAL_COMMUNITY): Payer: Self-pay

## 2021-12-17 ENCOUNTER — Other Ambulatory Visit (HOSPITAL_COMMUNITY): Payer: Self-pay

## 2021-12-30 ENCOUNTER — Other Ambulatory Visit: Payer: Self-pay

## 2021-12-30 MED FILL — Isosorbide Mononitrate Tab ER 24HR 60 MG: ORAL | 30 days supply | Qty: 30 | Fill #2 | Status: AC

## 2021-12-31 ENCOUNTER — Other Ambulatory Visit: Payer: Self-pay | Admitting: Urology

## 2022-01-09 ENCOUNTER — Other Ambulatory Visit: Payer: Self-pay

## 2022-01-14 ENCOUNTER — Other Ambulatory Visit: Payer: Self-pay

## 2022-01-14 ENCOUNTER — Other Ambulatory Visit: Payer: Self-pay | Admitting: Student

## 2022-01-15 ENCOUNTER — Other Ambulatory Visit (HOSPITAL_COMMUNITY): Payer: Self-pay

## 2022-01-15 MED ORDER — METOPROLOL TARTRATE 25 MG PO TABS
25.0000 mg | ORAL_TABLET | Freq: Two times a day (BID) | ORAL | 11 refills | Status: DC
  Filled 2022-01-15: qty 60, 30d supply, fill #0
  Filled 2022-02-14: qty 60, 30d supply, fill #1
  Filled 2022-03-10: qty 60, 30d supply, fill #2
  Filled 2022-04-08: qty 180, 90d supply, fill #3
  Filled 2022-07-06: qty 180, 90d supply, fill #4
  Filled 2022-10-09: qty 180, 90d supply, fill #5

## 2022-01-17 ENCOUNTER — Other Ambulatory Visit: Payer: Self-pay

## 2022-01-17 ENCOUNTER — Encounter (HOSPITAL_BASED_OUTPATIENT_CLINIC_OR_DEPARTMENT_OTHER): Payer: Self-pay | Admitting: Urology

## 2022-01-17 NOTE — Progress Notes (Addendum)
Spoke w/ via phone for pre-op interview---[facility] needs dos----  ISTAT, EKG             Lab results------11/13/21 Chest CT in Epic COVID test -----patient states asymptomatic no test needed Arrive at -------0630 on 01/20/2022 NPO after MN NO Solid Food.  Clear liquids from MN until---0530 Med rec completed Medications to take morning of surgery -----Imdur, Metoprolol, Chantix Diabetic medication -----n/a Patient instructed no nail polish to be worn day of surgery Patient instructed to bring photo id and insurance card day of surgery Patient aware to have Driver (ride ) / caregiver    for 24 hours after surgery - wife, [facility] Patient Special Instructions -----Do your best not to smoke for 24 hours before surgery. Pre-Op special Istructions -----no Patient verbalized understanding of instructions that were given at this phone interview. Patient denies shortness of breath, chest pain, fever, cough at this phone interview.  Anesthesia Review: HTN, CAD, hx of inferior STEMI 11/30/17 s/p PCI with DES to RCA, AAA 4 cm (stable) see CT chest from 11/13/21 in Epic  Patient denies cardiac symptoms. He does get sob w/ stairs but recovers quickly (which is normal for him.) Patient stated last taken nitro > 6 months ago.  Notes / results in Epic: PCP: Dr. Delene Ruffini lov 10/25/21 Cardiologist: Dr. Linard Millers lov 10/25/20 Chest xray: CTA 11/13/21 EKG: 10/25/20 (ordered for day of surgery) Echo: 05/03/20 Stress test: nuclear 08/01/20 Cardiac cath: 11/30/17 Sleep study/ CPAP: no  Patient states he takes ASA 81 mg daily. He states that he does this on his own, it was not prescribed by a doctor. States he stopped five days prior to surgery per instructions from Dr. Diona Fanti.  Reviewed the above information with Dr. Gifford Shave, MDA on 01/17/21. Ok to proceed with surgery w/o additional cardiac clearance per Dr. Gifford Shave.

## 2022-01-17 NOTE — Anesthesia Preprocedure Evaluation (Signed)
Anesthesia Evaluation  Patient identified by MRN, date of birth, ID band Patient awake    Reviewed: Allergy & Precautions, NPO status , Patient's Chart, lab work & pertinent test results  History of Anesthesia Complications Negative for: history of anesthetic complications  Airway Mallampati: III  TM Distance: >3 FB Neck ROM: Full    Dental  (+) Dental Advisory Given,    Pulmonary neg shortness of breath, neg sleep apnea, COPD, neg recent URI, Patient abstained from smoking., former smoker   Pulmonary exam normal breath sounds clear to auscultation       Cardiovascular hypertension (ISMN, losartan, metoprolol), Pt. on medications and Pt. on home beta blockers (-) angina + CAD, + Past MI (STEMI 11/30/2017), + Cardiac Stents (DES to proximal RCA 11/30/2017) and + DOE  + dysrhythmias (incomplete RBBB)  Rhythm:Regular Rate:Normal  HLD, Behcet's disease, 4 cm ascending aortic aneurysm (last CTA 11/13/2021)  Low-risk stress test 08/01/2020  TTE 05/03/2020: IMPRESSIONS     1. There is moderate dilatation of the aortic root, measuring 47 mm.  There is mild dilatation of the ascending aorta, measuring 44 mm.  Recommend CTA vs MRA for further evaluation. In 2019, the aortic root  measured 25m.   2. Left ventricular ejection fraction, by estimation, is 60 to 65%. Left  ventricular ejection fraction by 3D volume is 69 %. The left ventricle has  normal function. The left ventricle has no regional wall motion  abnormalities. There is moderate  concentric left ventricular hypertrophy. Left ventricular diastolic  parameters are consistent with Grade II diastolic dysfunction  (pseudonormalization).   3. Right ventricular systolic function is normal. The right ventricular  size is mildly enlarged. There is normal pulmonary artery systolic  pressure. The estimated right ventricular systolic pressure is 217.6mmHg.   4. Left atrial size was  moderately dilated.   5. Right atrial size was mildly dilated.   6. The mitral valve is normal in structure. Trivial mitral valve  regurgitation. No evidence of mitral stenosis.   7. The aortic valve is tricuspid. Aortic valve regurgitation is not  visualized. No aortic stenosis is present.   8. Aortic dilatation noted. There is moderate dilatation of the aortic  root, measuring 47 mm. There is mild dilatation of the ascending aorta,  measuring 44 mm.   9. The inferior vena cava is normal in size with greater than 50%  respiratory variability, suggesting right atrial pressure of 3 mmHg.     Neuro/Psych neg Seizures BPPV    GI/Hepatic negative GI ROS, Neg liver ROS,,,  Endo/Other  neg diabetes  Thyroid mass  Renal/GU negative Renal ROS   Bladder cancer    Musculoskeletal   Abdominal  (+) + obese  Peds  Hematology negative hematology ROS (+)   Anesthesia Other Findings   Reproductive/Obstetrics                             Anesthesia Physical Anesthesia Plan  ASA: 3  Anesthesia Plan: General   Post-op Pain Management: Tylenol PO (pre-op)*   Induction: Intravenous  PONV Risk Score and Plan: 2 and Ondansetron, Dexamethasone and Treatment may vary due to age or medical condition  Airway Management Planned: LMA  Additional Equipment:   Intra-op Plan:   Post-operative Plan: Extubation in OR  Informed Consent: I have reviewed the patients History and Physical, chart, labs and discussed the procedure including the risks, benefits and alternatives for the proposed anesthesia with the  patient or authorized representative who has indicated his/her understanding and acceptance.     Dental advisory given  Plan Discussed with: CRNA and Anesthesiologist  Anesthesia Plan Comments: (Risks of general anesthesia discussed including, but not limited to, sore throat, hoarse voice, chipped/damaged teeth, injury to vocal cords, nausea and vomiting,  allergic reactions, lung infection, heart attack, stroke, and death. All questions answered. )        Anesthesia Quick Evaluation

## 2022-01-20 ENCOUNTER — Encounter (HOSPITAL_BASED_OUTPATIENT_CLINIC_OR_DEPARTMENT_OTHER)
Admission: RE | Disposition: A | Discharge status: Home / Self Care | Payer: Self-pay | Source: Ambulatory Visit | Attending: Urology

## 2022-01-20 ENCOUNTER — Ambulatory Visit (HOSPITAL_BASED_OUTPATIENT_CLINIC_OR_DEPARTMENT_OTHER): Payer: Commercial Managed Care - HMO | Admitting: Anesthesiology

## 2022-01-20 ENCOUNTER — Other Ambulatory Visit: Payer: Self-pay

## 2022-01-20 ENCOUNTER — Encounter (HOSPITAL_BASED_OUTPATIENT_CLINIC_OR_DEPARTMENT_OTHER): Payer: Self-pay | Admitting: Urology

## 2022-01-20 ENCOUNTER — Ambulatory Visit (HOSPITAL_BASED_OUTPATIENT_CLINIC_OR_DEPARTMENT_OTHER)
Admission: RE | Admit: 2022-01-20 | Discharge: 2022-01-20 | Disposition: A | Discharge status: Home / Self Care | Payer: Commercial Managed Care - HMO | Source: Ambulatory Visit | Attending: Urology | Admitting: Urology

## 2022-01-20 DIAGNOSIS — C675 Malignant neoplasm of bladder neck: Secondary | ICD-10-CM | POA: Insufficient documentation

## 2022-01-20 DIAGNOSIS — I10 Essential (primary) hypertension: Secondary | ICD-10-CM | POA: Insufficient documentation

## 2022-01-20 DIAGNOSIS — I7121 Aneurysm of the ascending aorta, without rupture: Secondary | ICD-10-CM | POA: Diagnosis not present

## 2022-01-20 DIAGNOSIS — E785 Hyperlipidemia, unspecified: Secondary | ICD-10-CM | POA: Insufficient documentation

## 2022-01-20 DIAGNOSIS — C679 Malignant neoplasm of bladder, unspecified: Secondary | ICD-10-CM | POA: Diagnosis not present

## 2022-01-20 DIAGNOSIS — Z79899 Other long term (current) drug therapy: Secondary | ICD-10-CM | POA: Diagnosis not present

## 2022-01-20 DIAGNOSIS — Z01818 Encounter for other preprocedural examination: Secondary | ICD-10-CM

## 2022-01-20 DIAGNOSIS — Z87891 Personal history of nicotine dependence: Secondary | ICD-10-CM | POA: Diagnosis not present

## 2022-01-20 DIAGNOSIS — C672 Malignant neoplasm of lateral wall of bladder: Secondary | ICD-10-CM

## 2022-01-20 DIAGNOSIS — I2510 Atherosclerotic heart disease of native coronary artery without angina pectoris: Secondary | ICD-10-CM

## 2022-01-20 DIAGNOSIS — I252 Old myocardial infarction: Secondary | ICD-10-CM | POA: Diagnosis not present

## 2022-01-20 DIAGNOSIS — Z955 Presence of coronary angioplasty implant and graft: Secondary | ICD-10-CM | POA: Diagnosis not present

## 2022-01-20 HISTORY — PX: TRANSURETHRAL RESECTION OF BLADDER TUMOR WITH MITOMYCIN-C: SHX6459

## 2022-01-20 LAB — POCT I-STAT, CHEM 8
BUN: 9 mg/dL (ref 8–23)
Calcium, Ion: 1.09 mmol/L — ABNORMAL LOW (ref 1.15–1.40)
Chloride: 102 mmol/L (ref 98–111)
Creatinine, Ser: 0.6 mg/dL — ABNORMAL LOW (ref 0.61–1.24)
Glucose, Bld: 143 mg/dL — ABNORMAL HIGH (ref 70–99)
HCT: 44 % (ref 39.0–52.0)
Hemoglobin: 15 g/dL (ref 13.0–17.0)
Potassium: 4.3 mmol/L (ref 3.5–5.1)
Sodium: 140 mmol/L (ref 135–145)
TCO2: 26 mmol/L (ref 22–32)

## 2022-01-20 SURGERY — TRANSURETHRAL RESECTION OF BLADDER TUMOR WITH MITOMYCIN-C
Anesthesia: General

## 2022-01-20 MED ORDER — ONDANSETRON HCL 4 MG/2ML IJ SOLN
INTRAMUSCULAR | Status: DC | PRN
  Administered 2022-01-20: 4 mg via INTRAVENOUS

## 2022-01-20 MED ORDER — MIDAZOLAM HCL 5 MG/5ML IJ SOLN
INTRAMUSCULAR | Status: DC | PRN
  Administered 2022-01-20: 2 mg via INTRAVENOUS

## 2022-01-20 MED ORDER — ACETAMINOPHEN 500 MG PO TABS
ORAL_TABLET | ORAL | Status: AC
  Filled 2022-01-20: qty 2

## 2022-01-20 MED ORDER — ONDANSETRON HCL 4 MG/2ML IJ SOLN
INTRAMUSCULAR | Status: AC
  Filled 2022-01-20: qty 2

## 2022-01-20 MED ORDER — PROPOFOL 10 MG/ML IV BOLUS
INTRAVENOUS | Status: AC
  Filled 2022-01-20: qty 20

## 2022-01-20 MED ORDER — DEXAMETHASONE SODIUM PHOSPHATE 10 MG/ML IJ SOLN
INTRAMUSCULAR | Status: DC | PRN
  Administered 2022-01-20: 5 mg via INTRAVENOUS

## 2022-01-20 MED ORDER — FENTANYL CITRATE (PF) 100 MCG/2ML IJ SOLN: 25.0000 ug | INTRAMUSCULAR | Status: DC | PRN

## 2022-01-20 MED ORDER — DEXAMETHASONE SODIUM PHOSPHATE 10 MG/ML IJ SOLN
INTRAMUSCULAR | Status: AC
  Filled 2022-01-20: qty 1

## 2022-01-20 MED ORDER — FENTANYL CITRATE (PF) 100 MCG/2ML IJ SOLN
INTRAMUSCULAR | Status: AC
  Filled 2022-01-20: qty 2

## 2022-01-20 MED ORDER — CEFAZOLIN SODIUM-DEXTROSE 2-4 GM/100ML-% IV SOLN
2.0000 g | INTRAVENOUS | Status: AC
  Administered 2022-01-20: 2 g via INTRAVENOUS

## 2022-01-20 MED ORDER — OXYCODONE HCL 5 MG/5ML PO SOLN: 5.0000 mg | Freq: Once | ORAL | Status: DC | PRN

## 2022-01-20 MED ORDER — MIDAZOLAM HCL 2 MG/2ML IJ SOLN
INTRAMUSCULAR | Status: AC
  Filled 2022-01-20: qty 2

## 2022-01-20 MED ORDER — LIDOCAINE 2% (20 MG/ML) 5 ML SYRINGE
INTRAMUSCULAR | Status: DC | PRN
  Administered 2022-01-20: 100 mg via INTRAVENOUS

## 2022-01-20 MED ORDER — SODIUM CHLORIDE 0.9 % IR SOLN
Status: DC | PRN
  Administered 2022-01-20: 500 mL via INTRAVESICAL

## 2022-01-20 MED ORDER — PROMETHAZINE HCL 25 MG/ML IJ SOLN: 6.2500 mg | INTRAMUSCULAR | Status: DC | PRN

## 2022-01-20 MED ORDER — CEFAZOLIN SODIUM-DEXTROSE 2-4 GM/100ML-% IV SOLN
INTRAVENOUS | Status: AC
  Filled 2022-01-20: qty 100

## 2022-01-20 MED ORDER — PROPOFOL 10 MG/ML IV BOLUS
INTRAVENOUS | Status: DC | PRN
  Administered 2022-01-20: 200 mg via INTRAVENOUS

## 2022-01-20 MED ORDER — GEMCITABINE CHEMO FOR BLADDER INSTILLATION 2000 MG
2000.0000 mg | Freq: Once | INTRAVENOUS | Status: AC
  Administered 2022-01-20: 2000 mg via INTRAVESICAL
  Filled 2022-01-20: qty 2000

## 2022-01-20 MED ORDER — LIDOCAINE HCL (PF) 2 % IJ SOLN
INTRAMUSCULAR | Status: AC
  Filled 2022-01-20: qty 5

## 2022-01-20 MED ORDER — FENTANYL CITRATE (PF) 100 MCG/2ML IJ SOLN
INTRAMUSCULAR | Status: DC | PRN
  Administered 2022-01-20 (×2): 25 ug via INTRAVENOUS

## 2022-01-20 MED ORDER — OXYCODONE HCL 5 MG PO TABS: 5.0000 mg | ORAL_TABLET | Freq: Once | ORAL | Status: DC | PRN

## 2022-01-20 MED ORDER — ACETAMINOPHEN 500 MG PO TABS
1000.0000 mg | ORAL_TABLET | Freq: Once | ORAL | Status: AC
  Administered 2022-01-20: 1000 mg via ORAL

## 2022-01-20 MED ORDER — LACTATED RINGERS IV SOLN: INTRAVENOUS | Status: DC

## 2022-01-20 SURGICAL SUPPLY — 31 items
BAG DRAIN URO-CYSTO SKYTR STRL (DRAIN) ×1 IMPLANT
BAG DRN RND TRDRP ANRFLXCHMBR (UROLOGICAL SUPPLIES) ×1
BAG DRN UROCATH (DRAIN) ×1
BAG URINE DRAIN 2000ML AR STRL (UROLOGICAL SUPPLIES) IMPLANT
BAG URINE LEG 500ML (DRAIN) IMPLANT
CATH FOLEY 2WAY SLVR  5CC 18FR (CATHETERS) ×1
CATH FOLEY 2WAY SLVR  5CC 20FR (CATHETERS)
CATH FOLEY 2WAY SLVR  5CC 22FR (CATHETERS)
CATH FOLEY 2WAY SLVR 5CC 18FR (CATHETERS) IMPLANT
CATH FOLEY 2WAY SLVR 5CC 20FR (CATHETERS) IMPLANT
CATH FOLEY 2WAY SLVR 5CC 22FR (CATHETERS) IMPLANT
CATH FOLEY 3WAY 30CC 22FR (CATHETERS) IMPLANT
CLOTH BEACON ORANGE TIMEOUT ST (SAFETY) ×1 IMPLANT
ELECT REM PT RETURN 9FT ADLT (ELECTROSURGICAL) ×1
ELECTRODE REM PT RTRN 9FT ADLT (ELECTROSURGICAL) ×1 IMPLANT
EVACUATOR MICROVAS BLADDER (UROLOGICAL SUPPLIES) IMPLANT
GLOVE BIO SURGEON STRL SZ8 (GLOVE) ×1 IMPLANT
GOWN STRL REUS W/TWL XL LVL3 (GOWN DISPOSABLE) ×1 IMPLANT
HOLDER FOLEY CATH W/STRAP (MISCELLANEOUS) IMPLANT
IV NS IRRIG 3000ML ARTHROMATIC (IV SOLUTION) ×1 IMPLANT
KIT TURNOVER CYSTO (KITS) ×1 IMPLANT
LOOP CUT BIPOLAR 24F LRG (ELECTROSURGICAL) ×1 IMPLANT
MANIFOLD NEPTUNE II (INSTRUMENTS) ×1 IMPLANT
NS IRRIG 500ML POUR BTL (IV SOLUTION) ×1 IMPLANT
PACK CYSTO (CUSTOM PROCEDURE TRAY) ×1 IMPLANT
PLUG CATH AND CAP STER (CATHETERS) IMPLANT
SYR TOOMEY IRRIG 70ML (MISCELLANEOUS) ×1
SYRINGE TOOMEY IRRIG 70ML (MISCELLANEOUS) ×1 IMPLANT
TUBE CONNECTING 12X1/4 (SUCTIONS) ×1 IMPLANT
TUBING UROLOGY SET (TUBING) IMPLANT
WATER STERILE IRR 500ML POUR (IV SOLUTION) IMPLANT

## 2022-01-20 NOTE — H&P (Signed)
H&P  Chief Complaint: Recurrent bladder cancer  History of Present Illness: 67 year old male presents at this time for repeat resection of recurrent bladder cancer.  This is his fourth procedure.  Past Medical History:  Diagnosis Date   AAA (abdominal aortic aneurysm) ([place])    followed by cardiologist--- last CTA 11-02-2020,  7m   [place]    [place]ologist note   Bladder cancer ([facility]    urologist--- dr dDiona Fanti--- first dx 12/ 2019 s/p turbt   CAD (coronary artery disease) cardiologist--- dr h. sTamala Julian  hx inferior STEMI  11-30-2017  s/p  PCI with DES to proxRCA;  normal echo 11-30-2017 in epic   DOE (dyspnea on exertion)    per pt a little sob w/ stairs but recovers quickly, stated this is usual   Emphysema/COPD ([facility]    History of 2019 novel coronavirus disease (COVID-19) 09/2019   per pt had positive home test approx. 09/ 2021 with mild symptoms that resolved   History of ST elevation myocardial infarction (STEMI) 11/30/2017   s/p  pci w/ des   Hypertension    followed by pcp   Myocardial infarction ([facility] 2019   Nocturia    S/P drug eluting coronary stent placement 11/30/2017   DES x1 to proxRCA   Wears contact lenses     Past Surgical History:  Procedure Laterality Date   CORONARY/GRAFT ACUTE MI REVASCULARIZATION N/A 11/30/2017   Procedure: Coronary/Graft Acute MI Revascularization;  Surgeon: SBelva Crome MD;  [facility];  Service: Cardiovascular;  Laterality: N/A;   LEFT HEART CATH AND CORONARY ANGIOGRAPHY N/A 11/30/2017   Procedure: LEFT HEART CATH AND CORONARY ANGIOGRAPHY;  Surgeon: SBelva Crome MD;  [facility];  Service: Cardiovascular;  Laterality: N/A;   TRANSURETHRAL RESECTION OF BLADDER TUMOR N/A 12/16/2017   Procedure: TRANSURETHRAL RESECTION OF BLADDER TUMOR (TURBT);  Surgeon: DFranchot Gallo MD;  Location: [facility];  Service: Urology;  Laterality: N/A;  90 MINS   TRANSURETHRAL RESECTION OF BLADDER  TUMOR WITH MITOMYCIN-C N/A 03/29/2020   Procedure: TRANSURETHRAL RESECTION OF BLADDER TUMOR WITH POST OPERATIVE INSTILLATION OF GEMCITABINE;  Surgeon: DFranchot Gallo MD;  [facility]  Service: Urology;  Laterality: N/A;  [place] 12/17/2020   Procedure: TRANSURETHRAL RESECTION OF BLADDER TUMOR WITH GEMCITABINE IN PACU;  Surgeon: DFranchot Gallo MD;  [facility]  Service: Urology;  Laterality: N/A;    Home Medications:    Allergies: No Known Allergies  History reviewed. No pertinent family history.  Social History:  reports that he has quit smoking. His smoking use included cigarettes. He smoked an average of .5 packs per day. He has never used smokeless tobacco. He reports that he does not drink alcohol and does not use drugs.  ROS: A complete review of systems was performed.  All systems are negative except for pertinent findings as noted.  Physical Exam:  Vital signs in last 24 hours: BP (!) 160/78   Pulse (!) 56   Temp 97.9 F (36.6 C) (Oral)   Resp 17   Ht '5\' 9"'$  (1.753 m)   Wt 109.7 kg   SpO2 99%   BMI 35.71 kg/m  Constitutional:  Alert and oriented, No acute distress Cardiovascular: Regular rate  Respiratory: Normal respiratory effort GI: Abdomen is soft, nontender, nondistended, no abdominal masses. No CVAT.  Genitourinary: Normal male phallus, testes are descended bilaterally and non-tender  and without masses, scrotum is normal in appearance without lesions or masses, perineum is normal on inspection. Lymphatic: No lymphadenopathy Neurologic: Grossly intact, no focal deficits Psychiatric: Normal mood and affect   I have reviewed urinalysis results    Impression/Assessment:  Recurrent bladder cancer  Plan:  TURBT, placement of gemcitabine postoperatively.

## 2022-01-20 NOTE — Anesthesia Postprocedure Evaluation (Signed)
Anesthesia Post Note  Patient: Darius [name]  Procedure(s) Performed: TRANSURETHRAL RESECTION OF BLADDER TUMOR WITH GEMCITABINE     Patient location during evaluation: PACU Anesthesia Type: General Level of consciousness: awake Pain management: pain level controlled Vital Signs Assessment: post-procedure vital signs reviewed and stable Respiratory status: spontaneous breathing, nonlabored ventilation and respiratory function stable Cardiovascular status: blood pressure returned to baseline and stable Postop Assessment: no apparent nausea or vomiting Anesthetic complications: no   No notable events documented.  Last Vitals:  Vitals:   01/20/22 0945 01/20/22 1000  BP: 129/80 134/71  Pulse: (!) 53 (!) 55  Resp: 10 16  Temp: (!) 36.4 C   SpO2: 100% 93%    Last Pain:  Vitals:   01/20/22 1000  TempSrc:   PainSc: 0-No pain                 Nilda Simmer

## 2022-01-20 NOTE — Anesthesia Procedure Notes (Signed)
Procedure Name: LMA Insertion Date/Time: 01/20/2022 8:43 AM  Performed by: Rogers Blocker, CRNAPre-anesthesia Checklist: Patient identified, Emergency Drugs available, Suction available and Patient being monitored Patient Re-evaluated:Patient Re-evaluated prior to induction Oxygen Delivery Method: Circle System Utilized Preoxygenation: Pre-oxygenation with 100% oxygen Induction Type: IV induction Ventilation: Mask ventilation without difficulty LMA: LMA inserted LMA Size: 4.0 Number of attempts: 1 Airway Equipment and Method: Bite block Placement Confirmation: positive ETCO2 Tube secured with: Tape Dental Injury: Teeth and Oropharynx as per pre-operative assessment

## 2022-01-20 NOTE — Transfer of Care (Signed)
Immediate Anesthesia Transfer of Care Note  Patient: Darius [name]  Procedure(s) Performed: TRANSURETHRAL RESECTION OF BLADDER TUMOR WITH GEMCITABINE  Patient Location: PACU  Anesthesia Type:General  Level of Consciousness: awake, alert , oriented, and patient cooperative  Airway & Oxygen Therapy: Patient Spontanous Breathing and Patient connected to face mask oxygen  Post-op Assessment: Report given to RN and Post -op Vital signs reviewed and stable  Post vital signs: Reviewed and stable  Last Vitals:  Vitals Value Taken Time  BP 128/69 01/20/22 0919  Temp 36.8 C 01/20/22 0915  Pulse 51 01/20/22 0922  Resp 12 01/20/22 0922  SpO2 100 % 01/20/22 0922  Vitals shown include unvalidated device data.  Last Pain:  Vitals:   01/20/22 6484  TempSrc: Oral         Complications: No notable events documented.

## 2022-01-20 NOTE — Op Note (Signed)
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  He has had 3 prior resections.  He presents for repeat resection and placement of intravesical gemcitabine  Findings: Urethra normal, prostate nonobstructive.  Bladder displayed scars from prior resections.  12 mm recurrence at the anterior bladder neck, no other urothelial lesions noted.  Ureteral orifices normal.  Description of procedure: Patient properly identified in the holding area, taken to the operating room where general anesthetic was administered with the LMA.  He is placed in the dorsolithotomy position.  Genitalia and perineum were prepped, draped, proper timeout performed.  21 French panendoscope passed under direct vision using the 30 degree lens.  Circumferential inspection of the bladder then performed.  Small recurrent seen, 12 mm, at the anterior bladder neck.  79 French resectoscope placed using the visual obturator.  Cutting loop been placed.  The bladder was inspected, and the bladder tumor was resected in the muscular layer with the scope.  Fragment was removed.  No further lesions were seen.  Base of the resection was then cauterized with adequate hemostasis.  Fragment was sent for pathology.  Scope was removed, 18 French Foley placed.  Balloon filled with 10 cc of water.  At this point the patient was awakened, taken the PACU in stable condition having tolerated procedure well.  In the PACU, 2 g of gemcitabine and diluent were then placed, left indwelling for an hour.  It  was then drained and the catheter removed.

## 2022-01-20 NOTE — Discharge Instructions (Addendum)
You may see some blood in the urine and may have some burning with urination for 48-72 hours. You also may notice that you have to urinate more frequently or urgently after your procedure which is normal.  You should call should you develop an inability urinate, fever > 101, persistent nausea and vomiting that prevents you from eating or drinking to stay hydrated. If you have a catheter, you will be taught how to take care of the catheter by the nursing staff prior to discharge from the hospital.  You may periodically feel a strong urge to void with the catheter in place.  This is a bladder spasm and most often can occur when having a bowel movement or moving around. It is typically self-limited and usually will stop after a few minutes.  You may use some Vaseline or Neosporin around the tip of the catheter to reduce friction at the tip of the penis. You may also see some blood in the urine.  A very small amount of blood can make the urine look quite red.  As long as the catheter is draining well, there usually is not a problem.  However, if the catheter is not draining well and is bloody, you should call the office [phone]) to notify us.    No acetaminophen/Tylenol until after 1:00pm today if needed for pain.     Post Anesthesia Home Care Instructions  Activity: Get plenty of rest for the remainder of the day. A responsible individual must stay with you for 24 hours following the procedure.  For the next 24 hours, DO NOT: -Drive a car -Paediatric nurse -Drink alcoholic beverages -Take any medication unless instructed by your physician -Make any legal decisions or sign important papers.  Meals: Start with liquid foods such as gelatin or soup. Progress to regular foods as tolerated. Avoid greasy, spicy, heavy foods. If nausea and/or vomiting occur, drink only clear liquids until the nausea and/or vomiting subsides. Call your physician if vomiting continues.  Special  Instructions/Symptoms: Your throat may feel dry or sore from the anesthesia or the breathing tube placed in your throat during surgery. If this causes discomfort, gargle with warm salt water. The discomfort should disappear within 24 hours.

## 2022-01-21 ENCOUNTER — Encounter (HOSPITAL_BASED_OUTPATIENT_CLINIC_OR_DEPARTMENT_OTHER): Payer: Self-pay | Admitting: Urology

## 2022-01-21 LAB — SURGICAL PATHOLOGY

## 2022-01-27 ENCOUNTER — Other Ambulatory Visit: Payer: Self-pay

## 2022-01-27 MED FILL — Isosorbide Mononitrate Tab ER 24HR 60 MG: ORAL | 30 days supply | Qty: 30 | Fill #3 | Status: AC

## 2022-02-10 ENCOUNTER — Other Ambulatory Visit: Payer: Self-pay

## 2022-02-11 ENCOUNTER — Other Ambulatory Visit (HOSPITAL_COMMUNITY): Payer: Self-pay

## 2022-02-14 ENCOUNTER — Other Ambulatory Visit: Payer: Self-pay

## 2022-02-25 ENCOUNTER — Other Ambulatory Visit: Payer: Self-pay

## 2022-02-25 MED FILL — Isosorbide Mononitrate Tab ER 24HR 60 MG: ORAL | 30 days supply | Qty: 30 | Fill #4 | Status: AC

## 2022-03-16 NOTE — Progress Notes (Signed)
Office Visit    Patient Name: Darius [name] Date of Encounter: 03/16/2022  Primary Care Provider:  Iona Beard, MD Primary Cardiologist:  Sinclair Grooms, MD (Inactive) Primary Electrophysiologist: None  Chief Complaint    Darius [name] is a 67 y.o. male with PMH of CAD s/p inferior STEMI 2019 treated with DES x2 to RCA with residual stenosis of 70% in LAD, HLD,  ascending aortic aneurysm,( 42 mm),HTN, tobacco abuse, Behcet's disease, bladder cancer s/p resection 2019 recurrent 2024 who presents today for 1 year follow-up of coronary artery disease.  Past Medical History    Past Medical History:  Diagnosis Date   AAA (abdominal aortic aneurysm) ([place])    followed by cardiologist--- last CTA 11-02-2020,  24m   [place]   Bladder cancer ([facility]    urologist--- dr dDiona Fanti--- first dx 12/ 2019 s/p turbt   CAD (coronary artery disease) cardiologist--- dr h. sTamala Julian  hx inferior STEMI  11-30-2017  s/p  PCI with DES to proxRCA;  normal echo 11-30-2017 in epic   DOE (dyspnea on exertion)    per pt a little sob w/ stairs but recovers quickly, stated this is usual   Emphysema/COPD ([facility]    History of 2019 novel coronavirus disease (COVID-19) 09/2019   per pt had positive home test approx. 09/ 2021 with mild symptoms that resolved   History of ST elevation myocardial infarction (STEMI) 11/30/2017   s/p  pci w/ des   Hypertension    followed by pcp   Myocardial infarction ([facility] 2019   Nocturia    S/P drug eluting coronary stent placement 11/30/2017   DES x1 to proxRCA   Wears contact lenses    Past Surgical History:  Procedure Laterality Date   CORONARY/GRAFT ACUTE MI REVASCULARIZATION N/A 11/30/2017   Procedure: Coronary/Graft Acute MI Revascularization;  Surgeon: SBelva Crome MD;  [facility];  Service: Cardiovascular;  Laterality: N/A;   LEFT HEART CATH AND CORONARY ANGIOGRAPHY N/A 11/30/2017   Procedure: LEFT  HEART CATH AND CORONARY ANGIOGRAPHY;  Surgeon: SBelva Crome MD;  [facility];  Service: Cardiovascular;  Laterality: N/A;   TRANSURETHRAL RESECTION OF BLADDER TUMOR N/A 12/16/2017   Procedure: TRANSURETHRAL RESECTION OF BLADDER TUMOR (TURBT);  Surgeon: DFranchot Gallo MD;  Location: [facility];  Service: Urology;  Laterality: N/A;  90 MINS   TRANSURETHRAL RESECTION OF BLADDER TUMOR WITH MITOMYCIN-C N/A 03/29/2020   Procedure: TRANSURETHRAL RESECTION OF BLADDER TUMOR WITH POST OPERATIVE INSTILLATION OF GEMCITABINE;  Surgeon: DFranchot Gallo MD;  [facility]  Service: Urology;  Laterality: N/A;  3IngallsMITOMYCIN-C N/A 12/17/2020   Procedure: TRANSURETHRAL RESECTION OF BLADDER TUMOR WITH GEMCITABINE IN PACU;  Surgeon: DFranchot Gallo MD;  [facility]  Service: Urology;  Laterality: N/A;   TRANSURETHRAL RESECTION OF BLADDER TUMOR WITH MITOMYCIN-C N/A 01/20/2022   Procedure: TRANSURETHRAL RESECTION OF BLADDER TUMOR WITH GEMCITABINE;  Surgeon: DFranchot Gallo MD;  [facility]  Service: Urology;  Laterality: N/A;  30 MINS    Allergies  No Known Allergies  History of Present Illness    Ran MJarome [name] is a 67year old male with the above mention past medical history who presents today for 1 year follow-up of coronary artery disease. Mr. NRosezetta Schlatterpresented to the ED in 11/2017 with an inferior STEMI. He  underwent emergent LHC by Dr. Tamala Julian that was complicated by Shirl Harris reflex with hypotension and bradycardia requiring multiple doses of atropine and levophed. He had a successful PCI/DES's to the  proximal and distal RCA for 100% occulusion, normal left main, and 60-70% mid vessel narrowing. He was placed on DAPT for 1 year with ASA and Brilinta. He developed gross hematuria two weeks later and was found to have a 4 cm urinary mass and was switched to  Plavix. He was referred to Urology and had resection 12/2017. Echo was completed and showed a dilated aortic root and EF of 60-65%. He was seen in follow up and ASA was discontinued by Dr. Tamala Julian. He developed some new fatigue when seen in 07/2020 and underwent a Lexiscan stress test that was normal with no new ischemia. He was last seen by Dr. Tamala Julian on 10/25/20 and had no complaints of CP. He was started on BB and had losartan increased for management of ascending  dilated aorta. CT angio of the chest was ordered and showed 42 mm and was ordered to be repeated in one year that showed no significant change.He underwent a TURP on 01/20/2022  for recurrent bladder cancer.  [name] presents today for overdue 1 year follow-up of coronary artery disease.  Since last being seen in the office patient reports that he is experiencing increased anginal episodes with activity.  He denies any shortness of breath but does note increased shortness of breath with heavy exertion.  His blood pressure today was initially elevated at 150/60 and was 138/72 on recheck.  He is compliant with his current medications and denies any adverse reactions.  He is continuing to smoke and requested a refill of his Chantix.  He reports sedentary lifestyle with little to no exercise.  During our visit we reviewed primary and secondary prevention strategies and the need to increase physical activity to at least 150 minutes/week.  Patient denies palpitations, dyspnea, PND, orthopnea, nausea, vomiting, dizziness, syncope, edema, weight gain, or early satiety.  Home Medications    Current Outpatient Medications  Medication Sig Dispense Refill   aspirin 81 MG chewable tablet Chew by mouth daily. Stopped around 01/14/22 for surgery on 01/20/22.  Patient states that ASA was not prescribed by a doctor.     atorvastatin (LIPITOR) 80 MG tablet Take 1 tablet (80 mg total) by mouth daily at 6 PM. 90 tablet 3   isosorbide mononitrate (IMDUR) 60 MG 24 hr  tablet Take 1 tablet (60 mg total) by mouth daily. 90 tablet 2   losartan (COZAAR) 25 MG tablet TAKE 1/2 TABLET BY MOUTH EVERY DAY (Patient not taking: Reported on 01/17/2022) 45 tablet 2   losartan (COZAAR) 25 MG tablet Take 1 tablet (25 mg total) by mouth daily. 90 tablet 3   meclizine (ANTIVERT) 25 MG tablet Take 1 tablet (25 mg total) by mouth 3 (three) times daily as needed for dizziness. (Patient not taking: Reported on 01/17/2022) 30 tablet 0   metoprolol tartrate (LOPRESSOR) 25 MG tablet Take 1 tablet (25 mg total) by mouth 2 (two) times daily. 60 tablet 11   Multiple Vitamin (MULTIVITAMIN WITH MINERALS) TABS tablet Take 1 tablet by mouth daily. Gummy vitamins     nitroGLYCERIN (NITROSTAT) 0.4 MG SL tablet Place 1 tablet (0.4 mg total) under the tongue every 5 (five) minutes as needed for chest pain. (Patient taking differently: Place 0.4 mg under the tongue every 5 (five) minutes as needed for chest pain. Hasn't used in greater  than 6 months.) 25 tablet 2   varenicline (CHANTIX) 1 MG tablet TAKE 1 TABLET BY MOUTH TWICE A DAY 168 tablet 1   No current facility-administered medications for this visit.   Facility-Administered Medications Ordered in Other Visits  Medication Dose Route Frequency Provider Last Rate Last Admin   gemcitabine (GEMZAR) chemo syringe for bladder instillation 2,000 mg  2,000 mg Bladder Instillation Once Franchot Gallo, MD         Review of Systems  Please see the history of present illness.    (+) Chest pain (+) Shortness of breath  All other systems reviewed and are otherwise negative except as noted above.  Physical Exam    Wt Readings from Last 3 Encounters:  01/20/22 241 lb 12.8 oz (109.7 kg)  10/25/21 239 lb 4.8 oz (108.5 kg)  01/22/21 241 lb (109.3 kg)   [id number] were no vitals filed for this visit.,There is no height or weight on file to calculate BMI.  Constitutional:      Appearance: Healthy appearance. Not in distress.  Neck:     Vascular:  JVD normal.  Pulmonary:     Effort: Pulmonary effort is normal.     Breath sounds: No wheezing. No rales. Diminished in the bases Cardiovascular:     Normal rate. Regular rhythm. Normal S1. Normal S2.      Murmurs: There is no murmur.  Edema:    Peripheral edema absent.  Abdominal:     Palpations: Abdomen is soft non tender. There is no hepatomegaly.  Skin:    General: Skin is warm and dry.  Neurological:     General: No focal deficit present.     Mental Status: Alert and oriented to person, place and time.     Cranial Nerves: Cranial nerves are intact.  EKG/LABS/ Recent Cardiac Studies    ECG personally reviewed by me today -none completed today   Lab Results  Component Value Date   WBC 8.8 07/18/2020   HGB 15.0 01/20/2022   HCT 44.0 01/20/2022   MCV 92 07/18/2020   PLT 446 07/18/2020   Lab Results  Component Value Date   CREATININE 0.60 (L) 01/20/2022   BUN 9 01/20/2022   NA 140 01/20/2022   K 4.3 01/20/2022   CL 102 01/20/2022   CO2 22 10/31/2020   Lab Results  Component Value Date   ALT 17 10/31/2020   AST 15 10/31/2020   ALKPHOS 87 10/31/2020   BILITOT 0.8 10/31/2020   Lab Results  Component Value Date   CHOL 103 10/31/2020   HDL 33 (L) 10/31/2020   LDLCALC 53 10/31/2020   TRIG 87 10/31/2020   CHOLHDL 3.1 10/31/2020    Lab Results  Component Value Date   HGBA1C 6.5 (H) 11/30/2017    Cardiac Studies & Procedures   CARDIAC CATHETERIZATION  CARDIAC CATHETERIZATION 11/30/2017  Narrative  A stent was successfully placed.  A stent was successfully placed.   Inferior ST elevation myocardial infarction due to 100% occlusion of the proximal RCA.  Successful therapy with proximal and distal vessel stenting reducing 100% and 80% stenoses to 10% and 0% respectively with improvement in TIMI flow from 0 to III.  22 x 4.0 mm Onyx stents were used with the proximal stent being postdilated to 4.5 mm in diameter.  Normal left main.  LAD contains segmental  60 to 70% mid vessel narrowing.  Large normal ramus intermedius.  Circumflex gives origin to 2 small obtuse marginal branches with a second marginal  containing 60 to 70% diffuse disease in the mid vessel.  Inferobasal akinesis, overall LVEF greater than 60%.  LVEDP 20.  Delayed reperfusion due to tortuous, angulated innominate/aortic arch anatomy.  Procedure complicated by Bezold-Jarisch response with profound hypotension and bradycardia requiring intravenous atropine and IV Levophed for heart rate and blood pressure support.  RECOMMENDATIONS:   IV fluid at a rate of 100 cc/h.  Wean Levophed once blood pressure is consistently above 100 mmHg.  Continue IV ReoPro for 8 hours.  Aspirin and Plavix dual antiplatelet therapy for 12 months.  High intensity statin therapy.  Institute beta-blocker therapy as tolerated by heart rate and blood pressure.  Consider angiotensin converting enzyme inhibitor or angiotensin receptor blocker for blood pressure control if required.  Patient has been on diuretic therapy as monotherapy for hypertension.  Smoking cessation.    Recommend uninterrupted dual antiplatelet therapy with Aspirin '81mg'$  daily and Ticagrelor '90mg'$  twice daily for a minimum of 12 months (ACS - Class I recommendation).  Findings Coronary Findings Diagnostic  Dominance: Right  Left Anterior Descending Prox LAD lesion is 65% stenosed.  Left Circumflex  Second Obtuse Marginal Branch 2nd Mrg lesion is 70% stenosed.  Right Coronary Artery Prox RCA lesion is 100% stenosed. Mid RCA lesion is 80% stenosed.  Right Posterior Atrioventricular Artery Post Atrio lesion is 100% stenosed.  Intervention  Prox RCA lesion Stent A stent was successfully placed. Post-Intervention Lesion Assessment The intervention was successful. There is a 10% residual stenosis post intervention.  Mid RCA lesion Stent A stent was successfully placed. Post-stent angioplasty was  performed. Post-Intervention Lesion Assessment The intervention was successful. There is a 0% residual stenosis post intervention.   STRESS TESTS  MYOCARDIAL PERFUSION IMAGING 08/01/2020  Narrative  The left ventricular ejection fraction is mildly decreased (45-54%).  Nuclear stress EF: 50%.  There was no ST segment deviation noted during stress.  The study is normal.  There are no perfusion defects suggestive of ischemia or scar.  This is a low risk study.   ECHOCARDIOGRAM  ECHOCARDIOGRAM COMPLETE 05/03/2020  Narrative ECHOCARDIOGRAM REPORT    Patient Name:   DAREL VANVALKENBURG [facility] Date of Exam: 05/03/2020 Medical Rec #:  [id number]       Height:       68.0 in Accession #:    [id number]      Weight:       230.4 lb Date of Birth:  03-11-1955       BSA:          2.170 m Patient Age:    47 years        BP:           122/86 mmHg Patient Gender: M               HR:           49 bpm. Exam Location:  [place]  Procedure: 2D Echo, 3D Echo, Cardiac Doppler and Color Doppler  Indications:     R06.00 Dyspnea  History:         Patient has prior history of Echocardiogram examinations, most recent 11/30/2017. CAD and Previous Myocardial Infarction, Signs/Symptoms:Dyspnea and Chest Pain; Risk Factors:Hypertension and Current Smoker. COVID 19 Virus (september, 2021).  Sonographer:     Deliah Boston RDCS Referring Phys:  [place] Diagnosing Phys: Gwyndolyn Kaufman MD  IMPRESSIONS   1. There is moderate dilatation of the aortic root, measuring 47 mm. There is mild dilatation of the ascending aorta, measuring 44  mm. Recommend CTA vs MRA for further evaluation. In 2019, the aortic root measured 8m. 2. Left ventricular ejection fraction, by estimation, is 60 to 65%. Left ventricular ejection fraction by 3D volume is 69 %. The left ventricle has normal function. The left ventricle has no regional wall motion abnormalities. There is moderate concentric left  ventricular hypertrophy. Left ventricular diastolic parameters are consistent with Grade II diastolic dysfunction (pseudonormalization). 3. Right ventricular systolic function is normal. The right ventricular size is mildly enlarged. There is normal pulmonary artery systolic pressure. The estimated right ventricular systolic pressure is 2123456mmHg. 4. Left atrial size was moderately dilated. 5. Right atrial size was mildly dilated. 6. The mitral valve is normal in structure. Trivial mitral valve regurgitation. No evidence of mitral stenosis. 7. The aortic valve is tricuspid. Aortic valve regurgitation is not visualized. No aortic stenosis is present. 8. Aortic dilatation noted. There is moderate dilatation of the aortic root, measuring 47 mm. There is mild dilatation of the ascending aorta, measuring 44 mm. 9. The inferior vena cava is normal in size with greater than 50% respiratory variability, suggesting right atrial pressure of 3 mmHg.  Comparison(s): Compared to prior echo in 2019, the aortic root has dilated further and measures 429m(was previously 4397m  FINDINGS Left Ventricle: Left ventricular ejection fraction, by estimation, is 60 to 65%. Left ventricular ejection fraction by 3D volume is 69 %. The left ventricle has normal function. The left ventricle has no regional wall motion abnormalities. The left ventricular internal cavity size was normal in size. There is moderate concentric left ventricular hypertrophy. Left ventricular diastolic parameters are consistent with Grade II diastolic dysfunction (pseudonormalization).  Right Ventricle: The right ventricular size is mildly enlarged. No increase in right ventricular wall thickness. Right ventricular systolic function is normal. There is normal pulmonary artery systolic pressure. The tricuspid regurgitant velocity is 2.32 m/s, and with an assumed right atrial pressure of 3 mmHg, the estimated right ventricular systolic pressure is 24.123456mHg.  Left Atrium: Left atrial size was moderately dilated.  Right Atrium: Right atrial size was mildly dilated.  Pericardium: There is no evidence of pericardial effusion.  Mitral Valve: The mitral valve is normal in structure. There is mild thickening of the mitral valve leaflet(s). Mild mitral annular calcification. Trivial mitral valve regurgitation. No evidence of mitral valve stenosis.  Tricuspid Valve: The tricuspid valve is normal in structure. Tricuspid valve regurgitation is trivial.  Aortic Valve: The aortic valve is tricuspid. Aortic valve regurgitation is not visualized. No aortic stenosis is present.  Pulmonic Valve: The pulmonic valve was normal in structure. Pulmonic valve regurgitation is not visualized.  Aorta: Aortic dilatation noted. There is moderate dilatation of the aortic root, measuring 47 mm. There is mild dilatation of the ascending aorta, measuring 44 mm.  Venous: The inferior vena cava is normal in size with greater than 50% respiratory variability, suggesting right atrial pressure of 3 mmHg.  IAS/Shunts: No atrial level shunt detected by color flow Doppler.   LEFT VENTRICLE PLAX 2D LVIDd:         5.30 cm         Diastology LVIDs:         3.00 cm         LV e' medial:    5.98 cm/s LV PW:         1.10 cm         LV E/e' medial:  10.7 LV IVS:  1.00 cm         LV e' lateral:   11.20 cm/s LVOT diam:     3.40 cm         LV E/e' lateral: 5.7 LV SV:         179 LV SV Index:   82 LVOT Area:     9.08 cm        3D Volume EF LV 3D EF:    Left ventricular ejection fraction by 3D volume is 69 %.  3D Volume EF: 3D EF:        69 % LV EDV:       210 ml LV ESV:       65 ml LV SV:        144 ml  RIGHT VENTRICLE RV S prime:     13.80 cm/s TAPSE (M-mode): 2.7 cm  LEFT ATRIUM              Index       RIGHT ATRIUM           Index LA diam:        5.20 cm  2.40 cm/m  RA Area:     29.60 cm LA Vol (A2C):   104.0 ml 47.92 ml/m RA Volume:   109.00 ml  50.22 ml/m LA Vol (A4C):   102.0 ml 47.00 ml/m LA Biplane Vol: 102.0 ml 47.00 ml/m AORTIC VALVE LVOT Vmax:   78.30 cm/s LVOT Vmean:  48.100 cm/s LVOT VTI:    0.197 m  AORTA Ao Root diam: 4.80 cm Ao Asc diam:  4.40 cm  MITRAL VALVE               TRICUSPID VALVE MV Area (PHT): cm         TR Peak grad:   21.5 mmHg MV Decel Time: 370 msec    TR Vmax:        232.00 cm/s MV E velocity: 63.85 cm/s MV A velocity: 54.60 cm/s  SHUNTS MV E/A ratio:  1.17        Systemic VTI:  0.20 m Systemic Diam: 3.40 cm  Gwyndolyn Kaufman MD Electronically signed by Gwyndolyn Kaufman MD Signature Date/Time: 05/03/2020/5:41:11 PM    Final (Updated)             Assessment & Plan    1.CAD: -s/p Inferior STEMI in 2019 treated with PCI/DES's to the  proximal and distal RCA -Today patient reports occasional episodes of shortness of breath and regular chest discomfort with heavy exertion. -Continue GDMT with continue GDMT with Lipitor 80 mg, Imdur 60 mg, Lopressor 25 mg twice daily -He was advised to take extra half tablet 30 mg of Imdur as needed for increased chest discomfort. -Patient's last ischemic evaluation was in 2022 by Myoview. -We will send patient for Lexiscan Myoview to rule out possible ischemia related to stable angina and shortness of breath -We will also repeat 2D echo to evaluate for any structural heart changes. -ED precautions were encouraged if chest pain returns.  2.Essential HTN: -Patients BP today was initially 150/60 and was 138/72 on recheck -Continue losartan 25 mg, metoprolol 25 mg twice daily -Patient was encouraged to follow a low-sodium heart healthy diet such as Dash  3.Ascending Thoracic Aortic Aneurysm: - Most recent chest CTA completed on 11/13/21 with no changes and recommendation of repeat CT next year -Continue blood pressure control with medications as noted above.  4.Hx of Tobacco Abuse: -Patient reports continue cigarette smoking and had  success with  Chantix -We will refill Chantix today and patient encouraged to stop and offered assistance  Disposition: Follow-up with Sinclair Grooms, MD (Inactive) or APP in 6 months Shared Decision Making/Informed Consent The risks [chest pain, shortness of breath, cardiac arrhythmias, dizziness, blood pressure fluctuations, myocardial infarction, stroke/transient ischemic attack, nausea, vomiting, allergic reaction, radiation exposure, metallic taste sensation and life-threatening complications (estimated to be 1 in 10,000)], benefits (risk stratification, diagnosing coronary artery disease, treatment guidance) and alternatives of a cardiac PET stress test were discussed in detail with [name] and he agrees to proceed.   Medication Adjustments/Labs and Tests Ordered: Current medicines are reviewed at length with the patient today.  Concerns regarding medicines are outlined above.   Signed, Mable Fill, Marissa Nestle, NP 03/16/2022, 2:47 PM  [facility]  Note:  This document was prepared using Dragon voice recognition software and may include unintentional dictation errors.

## 2022-03-17 ENCOUNTER — Encounter: Payer: Self-pay | Admitting: Nurse Practitioner

## 2022-03-17 ENCOUNTER — Other Ambulatory Visit (HOSPITAL_COMMUNITY): Payer: Self-pay

## 2022-03-17 ENCOUNTER — Ambulatory Visit: Payer: Commercial Managed Care - HMO | Attending: Nurse Practitioner | Admitting: Nurse Practitioner

## 2022-03-17 VITALS — BP 138/72 | HR 66 | Ht 69.0 in | Wt 252.4 lb

## 2022-03-17 DIAGNOSIS — I10 Essential (primary) hypertension: Secondary | ICD-10-CM

## 2022-03-17 DIAGNOSIS — Z87891 Personal history of nicotine dependence: Secondary | ICD-10-CM

## 2022-03-17 DIAGNOSIS — I7121 Aneurysm of the ascending aorta, without rupture: Secondary | ICD-10-CM | POA: Diagnosis not present

## 2022-03-17 DIAGNOSIS — I25118 Atherosclerotic heart disease of native coronary artery with other forms of angina pectoris: Secondary | ICD-10-CM | POA: Diagnosis not present

## 2022-03-17 DIAGNOSIS — I2089 Other forms of angina pectoris: Secondary | ICD-10-CM

## 2022-03-17 MED ORDER — VARENICLINE TARTRATE 1 MG PO TABS
1.0000 mg | ORAL_TABLET | Freq: Two times a day (BID) | ORAL | 1 refills | Status: DC
  Filled 2022-03-17: qty 180, 90d supply, fill #0

## 2022-03-17 NOTE — Patient Instructions (Addendum)
Medication Instructions:  Your physician recommends that you continue on your current medications as directed. Please refer to the Current Medication list given to you today. *If you need a refill on your cardiac medications before your next appointment, please call your pharmacy*   Lab Work: None ordered If you have labs (blood work) drawn today and your tests are completely normal, you will receive your results only by: [place] (if you have MyChart) OR A paper copy in the mail If you have any lab test that is abnormal or we need to change your treatment, we will call you to review the results.   Testing/Procedures: Your physician has requested that you have a lexiscan myoview. For further information please visit [email or web address]. Please follow instruction sheet, as given.  Your physician has requested that you have an echocardiogram. Echocardiography is a painless test that uses sound waves to create images of your heart. It provides your doctor with information about the size and shape of your heart and how well your heart's chambers and valves are working. This procedure takes approximately one hour. There are no restrictions for this procedure. Please do NOT wear cologne, perfume, aftershave, or lotions (deodorant is allowed). Please arrive 15 minutes prior to your appointment time.   Follow-Up: At [facility], you and your health needs are our priority.  As part of our continuing mission to provide you with exceptional heart care, we have created designated Provider Care Teams.  These Care Teams include your primary Cardiologist (physician) and Advanced Practice Providers (APPs -  Physician Assistants and Nurse Practitioners) who all work together to provide you with the care you need, when you need it.  We recommend signing up for the patient portal called "MyChart".  Sign up information is provided on this After Visit Summary.  MyChart is used to connect with  patients for Virtual Visits (Telemedicine).  Patients are able to view lab/test results, encounter notes, upcoming appointments, etc.  Non-urgent messages can be sent to your provider as well.   To learn more about what you can do with MyChart, go to [email or web address].    Your next appointment:   6 month(s)  Provider:   Early Osmond, MD Rudean Haskell, MD Gwyndolyn Kaufman, MD Other Instructions:  EXERCISE FOR 150 MINUTES A WEEK

## 2022-03-18 ENCOUNTER — Other Ambulatory Visit (HOSPITAL_COMMUNITY): Payer: Self-pay

## 2022-03-19 ENCOUNTER — Other Ambulatory Visit (HOSPITAL_COMMUNITY): Payer: Self-pay

## 2022-03-27 ENCOUNTER — Other Ambulatory Visit: Payer: Self-pay

## 2022-03-27 MED FILL — Isosorbide Mononitrate Tab ER 24HR 60 MG: ORAL | 30 days supply | Qty: 30 | Fill #5 | Status: AC

## 2022-04-08 ENCOUNTER — Other Ambulatory Visit (HOSPITAL_COMMUNITY): Payer: Self-pay

## 2022-04-28 ENCOUNTER — Other Ambulatory Visit (HOSPITAL_COMMUNITY): Payer: Self-pay

## 2022-04-28 MED FILL — Isosorbide Mononitrate Tab ER 24HR 60 MG: ORAL | 90 days supply | Qty: 90 | Fill #6 | Status: AC

## 2022-05-01 ENCOUNTER — Telehealth: Payer: Self-pay | Admitting: *Deleted

## 2022-05-01 NOTE — Telephone Encounter (Signed)
Spoke with patient and he was given detailed instructions about his STRESS Test on 05/02/22.

## 2022-05-02 ENCOUNTER — Ambulatory Visit (HOSPITAL_BASED_OUTPATIENT_CLINIC_OR_DEPARTMENT_OTHER): Payer: Commercial Managed Care - HMO

## 2022-05-02 ENCOUNTER — Ambulatory Visit (HOSPITAL_COMMUNITY): Payer: Commercial Managed Care - HMO | Attending: Nurse Practitioner

## 2022-05-02 DIAGNOSIS — I10 Essential (primary) hypertension: Secondary | ICD-10-CM | POA: Insufficient documentation

## 2022-05-02 DIAGNOSIS — Z87891 Personal history of nicotine dependence: Secondary | ICD-10-CM

## 2022-05-02 DIAGNOSIS — I77810 Thoracic aortic ectasia: Secondary | ICD-10-CM

## 2022-05-02 DIAGNOSIS — I5030 Unspecified diastolic (congestive) heart failure: Secondary | ICD-10-CM

## 2022-05-02 DIAGNOSIS — I7121 Aneurysm of the ascending aorta, without rupture: Secondary | ICD-10-CM | POA: Diagnosis not present

## 2022-05-02 DIAGNOSIS — I25118 Atherosclerotic heart disease of native coronary artery with other forms of angina pectoris: Secondary | ICD-10-CM | POA: Insufficient documentation

## 2022-05-02 DIAGNOSIS — I517 Cardiomegaly: Secondary | ICD-10-CM | POA: Diagnosis not present

## 2022-05-02 LAB — ECHOCARDIOGRAM COMPLETE
Area-P 1/2: 3.72 cm2
S' Lateral: 3.45 cm

## 2022-05-02 MED ORDER — TECHNETIUM TC 99M TETROFOSMIN IV KIT
31.2000 | PACK | Freq: Once | INTRAVENOUS | Status: AC | PRN
  Administered 2022-05-02: 31.2 via INTRAVENOUS

## 2022-05-06 ENCOUNTER — Ambulatory Visit (HOSPITAL_COMMUNITY): Payer: Commercial Managed Care - HMO | Attending: Cardiology

## 2022-05-06 DIAGNOSIS — I25118 Atherosclerotic heart disease of native coronary artery with other forms of angina pectoris: Secondary | ICD-10-CM | POA: Insufficient documentation

## 2022-05-06 DIAGNOSIS — Z87891 Personal history of nicotine dependence: Secondary | ICD-10-CM | POA: Insufficient documentation

## 2022-05-06 DIAGNOSIS — I10 Essential (primary) hypertension: Secondary | ICD-10-CM | POA: Insufficient documentation

## 2022-05-06 DIAGNOSIS — I7121 Aneurysm of the ascending aorta, without rupture: Secondary | ICD-10-CM | POA: Insufficient documentation

## 2022-05-06 LAB — MYOCARDIAL PERFUSION IMAGING
Base ST Depression (mm): 0 mm
LV dias vol: 127 mL (ref 62–150)
LV sys vol: 58 mL
Nuc Stress EF: 55 %
Peak HR: 72 {beats}/min
Rest HR: 58 {beats}/min
Rest Nuclear Isotope Dose: 31.2 mCi
SDS: 2
SRS: 2
SSS: 4
ST Depression (mm): 0 mm
Stress Nuclear Isotope Dose: 32.9 mCi
TID: 0.96

## 2022-05-06 MED ORDER — REGADENOSON 0.4 MG/5ML IV SOLN
0.4000 mg | Freq: Once | INTRAVENOUS | Status: AC
  Administered 2022-05-06: 0.4 mg via INTRAVENOUS

## 2022-05-06 MED ORDER — TECHNETIUM TC 99M TETROFOSMIN IV KIT
32.9000 | PACK | Freq: Once | INTRAVENOUS | Status: AC | PRN
  Administered 2022-05-06: 32.9 via INTRAVENOUS

## 2022-06-12 ENCOUNTER — Encounter: Payer: Self-pay | Admitting: *Deleted

## 2022-06-23 ENCOUNTER — Ambulatory Visit (INDEPENDENT_AMBULATORY_CARE_PROVIDER_SITE_OTHER): Payer: Commercial Managed Care - HMO | Admitting: Student

## 2022-06-23 ENCOUNTER — Encounter: Payer: Self-pay | Admitting: Student

## 2022-06-23 VITALS — BP 133/63 | HR 68 | Temp 97.6°F | Ht 69.0 in | Wt 248.8 lb

## 2022-06-23 DIAGNOSIS — R1031 Right lower quadrant pain: Secondary | ICD-10-CM | POA: Diagnosis not present

## 2022-06-23 DIAGNOSIS — R109 Unspecified abdominal pain: Secondary | ICD-10-CM | POA: Insufficient documentation

## 2022-06-23 LAB — POCT URINALYSIS DIPSTICK
Bilirubin, UA: NEGATIVE
Blood, UA: NEGATIVE
Glucose, UA: POSITIVE — AB
Leukocytes, UA: NEGATIVE
Nitrite, UA: NEGATIVE
Protein, UA: POSITIVE — AB
Spec Grav, UA: >=1.03 — AB (ref 1.010–1.025)
Urobilinogen, UA: 1 E.U./dL
pH, UA: 6 (ref 5.0–8.0)

## 2022-06-23 MED ORDER — SULFAMETHOXAZOLE-TRIMETHOPRIM 800-160 MG PO TABS: 1.0000 | ORAL_TABLET | Freq: Two times a day (BID) | ORAL | 0 refills | Status: DC

## 2022-06-23 NOTE — Addendum Note (Signed)
Addended by: Morene Crocker on: 06/23/2022 06:19 PM   Modules accepted: Orders

## 2022-06-23 NOTE — Progress Notes (Signed)
Subjective:  CC: R flank pain   HPI:  [name] is a 67 y.o. male with a past medical history stated below and presents today for R flank pain. Please see problem based assessment and plan for additional details.  Past Medical History:  Diagnosis Date   AAA (abdominal aortic aneurysm) (HCC)    followed by cardiologist--- last CTA 11-02-2020,  42mm   Behcet's disease (HCC)    per cardiologist note   Bladder cancer [facility]    urologist--- dr Retta Diones---- first dx 12/ 2019 s/p turbt   CAD (coronary artery disease) cardiologist--- dr h. Katrinka Blazing   hx inferior STEMI  11-30-2017  s/p  PCI with DES to proxRCA;  normal echo 11-30-2017 in epic   DOE (dyspnea on exertion)    per pt a little sob w/ stairs but recovers quickly, stated this is usual   Emphysema/COPD [facility]    History of 2019 novel coronavirus disease (COVID-19) 09/2019   per pt had positive home test approx. 09/ 2021 with mild symptoms that resolved   History of ST elevation myocardial infarction (STEMI) 11/30/2017   s/p  pci w/ des   Hypertension    followed by pcp   Myocardial infarction [facility]) 2019   Nocturia    S/P drug eluting coronary stent placement 11/30/2017   DES x1 to proxRCA   Wears contact lenses     Current Outpatient Medications on File Prior to Visit  Medication Sig Dispense Refill   atorvastatin (LIPITOR) 80 MG tablet Take 1 tablet (80 mg total) by mouth daily at 6 PM. 90 tablet 3   isosorbide mononitrate (IMDUR) 60 MG 24 hr tablet Take 1 tablet (60 mg total) by mouth daily. 90 tablet 2   losartan (COZAAR) 25 MG tablet Take 1 tablet (25 mg total) by mouth daily. 90 tablet 3   meclizine (ANTIVERT) 25 MG tablet Take 1 tablet (25 mg total) by mouth 3 (three) times daily as needed for dizziness. 30 tablet 0   metoprolol tartrate (LOPRESSOR) 25 MG tablet Take 1 tablet (25 mg total) by mouth 2 (two) times daily. 60 tablet 11   Multiple Vitamin (MULTIVITAMIN WITH MINERALS) TABS tablet Take 1 tablet by  mouth daily. Gummy vitamins     nitroGLYCERIN (NITROSTAT) 0.4 MG SL tablet Place 1 tablet (0.4 mg total) under the tongue every 5 (five) minutes as needed for chest pain. 25 tablet 2   varenicline (CHANTIX) 1 MG tablet Take 1 tablet (1 mg total) by mouth 2 (two) times daily. 180 tablet 1   Current Facility-Administered Medications on File Prior to Visit  Medication Dose Route Frequency Provider Last Rate Last Admin   gemcitabine (GEMZAR) chemo syringe for bladder instillation 2,000 mg  2,000 mg Bladder Instillation Once Marcine Matar, MD        No family history on file.  Social History   Socioeconomic History   Marital status: Married    Spouse name: Not on file   Number of children: Not on file   Years of education: Not on file   Highest education level: Not on file  Occupational History   Not on file  Tobacco Use   Smoking status: Former    Packs/day: .5    Types: Cigarettes   Smokeless tobacco: Never  Vaping Use   Vaping Use: Never used  Substance and Sexual Activity   Alcohol use: No   Drug use: Never   Sexual activity: Not on file  Other Topics Concern  Not on file  Social History Narrative   Not on file   Social Determinants of Health   Financial Resource Strain: Not on file  Food Insecurity: Not on file  Transportation Needs: Not on file  Physical Activity: Not on file  Stress: Not on file  Social Connections: Not on file  Intimate Partner Violence: Not on file    Review of Systems: ROS negative except for what is noted on the assessment and plan.  Objective:   Vitals:   06/23/22 1413  BP: 133/63  Pulse: 68  Temp: 97.6 F (36.4 C)  TempSrc: Oral  SpO2: 95%  Weight: 248 lb 12.8 oz (112.9 kg)  Height: 5\' 9"  (1.753 m)    Physical Exam: Constitutional: well-appearing man laying in exam bed, in no acute distress HENT: normocephalic atraumatic, mucous membranes moist Eyes: conjunctiva non-erythematous Neck: supple Cardiovascular: regular  rate and rhythm, no m/r/g Pulmonary/Chest: normal work of breathing on room air, lungs clear to auscultation bilaterally Abdominal: soft, non-tender, non-distended MSK: normal bulk and tone Neurological: alert & oriented x 3, 5/5 strength in bilateral upper and lower extremities, normal gait Skin: warm and dry Psych: Pleasant mood and affect       10/25/2021    9:31 AM  Depression screen PHQ 2/9  Decreased Interest 0  Down, Depressed, Hopeless 0  PHQ - 2 Score 0        No data to display           Assessment & Plan:   Right flank pain Paitent with a history of recurrent bladder cancer (first tumor resection in 12/2020) now s/p repeat transurethral resection of bladder tummor 12 mm  by Dr. Retta Diones on 01/2022 with placement gemcitabine postoperatively presenting to clinic with R flank pain 3/10  in intensity that intermittently radiates to the R inguinal area.   Of note, this pain started 3 weeks ago on the R flank and has progressively gotten worse. At baselike it is a 3/10 and intermittently gets worse in short bursts. He last saw urology ~month ago for cystoscopy.   Patient denies dysuria, only suprapubic tenderness on the R side. No polyuria, hematuria. Patient endorses some chills/rigors, but no fevers, n/v, changes in bowel habits. No trauma, weakness or changes in sensation.  There is not recent CT of abdomen or pelvis to review. No history of renal stones per patient.  Physical exam significant for CVA tenderness and R suprapubic tenderness. No paraspinal, buttocks, or leg tenderness. Normal, full active ROM at the hip and with resistance. - straight leg raise. Normal BS, without tenderness to deep palpation.  Overall differential diagnosis includes complicated UTI, especially with CVA tenderness and after instrumentation, nephrolithiasis, hydronephrosis, and muscle strain. However, given his history of recurrent bladder tumors, bone metastasis is also in the differential  though he has been diagnosed with low grade urothelial carcinoma with low likelihood of spreading.  Urinary dipstick in clinic positive for proteinuria without evidence of leukocytes or nitrites or hematuria. Thus, will hold on starting empiric antibiotics at this time.  Plan: - U/A with microscopy - CT renal with and without contrast to evaluate for pyelonephritis and nephrolithiasis  - Follow up with urology as this could potentially be complications from recent procedures    Return if symptoms worsen or fail to improve.  Patient discussed with Dr. Gardiner Ramus, MD [facility] Internal Medicine Program - PGY-1 06/23/2022, 6:14 PM

## 2022-06-23 NOTE — Patient Instructions (Addendum)
Thank you, [name] for allowing [place] to provide your care today. Today we discussed your left flank pain that we are concerned is from urinary infection.    We are ordering a CT scan of your kidneys to make sure that there is no stone or swelling of the ureters (connect your kidneys to your bladder). Expect a call so you can schedule it  Please contact your urologist and let them know about this symptoms as well. They would want to know. You can inform them about the treatment with [place].  I have ordered the following labs for you: Lab Orders         Urinalysis, Complete (81001)         POCT Urinalysis Dipstick (16109)      I will call if any are abnormal. All of your labs can be accessed through "My Chart".   My Chart Access: [email or web address]  Please follow-up in: if the symptoms do not improve in the next 2 weeks.    We look forward to seeing you next time. Please call our clinic at [phone] if you have any questions or concerns. The best time to call is Monday-Friday from 9am-4pm, but there is someone available 24/7. If after hours or the weekend, call the main hospital number and ask for the Internal Medicine Resident On-Call. If you need medication refills, please notify your pharmacy one week in advance and they will send [place] a request.   Thank you for letting us take part in your care. Wishing you the best!  Morene Crocker, MD 06/23/2022, 3:05 PM [facility] Internal Medicine Resident, PGY-1

## 2022-06-23 NOTE — Assessment & Plan Note (Signed)
Paitent with a history of recurrent bladder cancer (first tumor resection in 12/2020) now s/p repeat transurethral resection of bladder tummor 12 mm  by Dr. Retta Diones on 01/2022 with placement gemcitabine postoperatively presenting to clinic with R flank pain 3/10  in intensity that intermittently radiates to the R inguinal area.   Of note, this pain started 3 weeks ago on the R flank and has progressively gotten worse. At baselike it is a 3/10 and intermittently gets worse in short bursts. He last saw urology ~month ago for cystoscopy.   Patient denies dysuria, only suprapubic tenderness on the R side. No polyuria, hematuria. Patient endorses some chills/rigors, but no fevers, n/v, changes in bowel habits. No trauma, weakness or changes in sensation.  There is not recent CT of abdomen or pelvis to review. No history of renal stones per patient.  Physical exam significant for CVA tenderness and R suprapubic tenderness. No paraspinal, buttocks, or leg tenderness. Normal, full active ROM at the hip and with resistance. - straight leg raise. Normal BS, without tenderness to deep palpation.  Overall differential diagnosis includes complicated UTI, especially with CVA tenderness and after instrumentation, nephrolithiasis, hydronephrosis, and muscle strain. However, given his history of recurrent bladder tumors, bone metastasis is also in the differential though he has been diagnosed with low grade urothelial carcinoma with low likelihood of spreading.  Urinary dipstick in clinic positive for proteinuria without evidence of leukocytes or nitrites or hematuria. Thus, will hold on starting empiric antibiotics at this time.  Plan: - U/A with microscopy - CT renal with and without contrast to evaluate for pyelonephritis and nephrolithiasis  - Follow up with urology as this could potentially be complications from recent procedures

## 2022-06-24 LAB — URINALYSIS, COMPLETE
Bilirubin, UA: NEGATIVE
Leukocytes,UA: NEGATIVE
Nitrite, UA: NEGATIVE
RBC, UA: NEGATIVE
Specific Gravity, UA: 1.026 (ref 1.005–1.030)
Urobilinogen, Ur: 1 mg/dL (ref 0.2–1.0)
pH, UA: 5.5 (ref 5.0–7.5)

## 2022-06-24 LAB — MICROSCOPIC EXAMINATION
Bacteria, UA: NONE SEEN
Casts: NONE SEEN /lpf
WBC, UA: NONE SEEN /hpf (ref 0–5)

## 2022-06-25 NOTE — Addendum Note (Signed)
Addended by: Morene Crocker on: 06/25/2022 04:47 PM   Modules accepted: Orders

## 2022-06-26 NOTE — Addendum Note (Signed)
Addended by: Morene Crocker on: 06/26/2022 05:03 PM   Modules accepted: Orders

## 2022-06-26 NOTE — Addendum Note (Signed)
Addended by: Burnell Blanks on: 06/26/2022 05:01 PM   Modules accepted: Orders

## 2022-06-26 NOTE — Addendum Note (Signed)
Addended by: Morene Crocker on: 06/26/2022 04:21 PM   Modules accepted: Orders

## 2022-06-26 NOTE — Progress Notes (Signed)
Discussed with patient. Pain is currently improving. Still working on scheduling CT abdomen and pelvis. However, no signs of infection on U/A, thus no indication for antibiotics at this time.  U/A with proteinuria without hematuria. No casts seen. At follow up please repeat u/a and test for ACR to monitor this.

## 2022-06-27 NOTE — Progress Notes (Signed)
Internal Medicine Clinic Attending  Case discussed with Dr. Daiva Eves  At the time of the visit.  We reviewed the resident's history and exam and pertinent patient test results.  I agree with the assessment, diagnosis, and plan of care documented in the resident's note. Systemically well, concern for upper tract infection lower at this time. Question complication from recent instrumentation vs nephrolithiasis. We will evaluate further with CT abdomen/pelvis and we have suggested f/u with urology.

## 2022-07-06 ENCOUNTER — Other Ambulatory Visit: Payer: Self-pay | Admitting: Student

## 2022-07-06 DIAGNOSIS — I25118 Atherosclerotic heart disease of native coronary artery with other forms of angina pectoris: Secondary | ICD-10-CM

## 2022-07-08 ENCOUNTER — Other Ambulatory Visit (HOSPITAL_COMMUNITY): Payer: Self-pay

## 2022-07-08 MED ORDER — ISOSORBIDE MONONITRATE ER 60 MG PO TB24
60.0000 mg | ORAL_TABLET | Freq: Every day | ORAL | 3 refills | Status: DC
  Filled 2022-07-08 – 2022-07-23 (×3): qty 90, 90d supply, fill #0

## 2022-07-09 ENCOUNTER — Other Ambulatory Visit (HOSPITAL_COMMUNITY): Payer: Self-pay

## 2022-07-10 ENCOUNTER — Ambulatory Visit
Admission: RE | Admit: 2022-07-10 | Discharge: 2022-07-10 | Disposition: A | Discharge status: Home / Self Care | Payer: Commercial Managed Care - HMO | Source: Ambulatory Visit | Attending: Internal Medicine | Admitting: Internal Medicine

## 2022-07-10 ENCOUNTER — Other Ambulatory Visit (HOSPITAL_COMMUNITY): Payer: Self-pay

## 2022-07-10 DIAGNOSIS — R109 Unspecified abdominal pain: Secondary | ICD-10-CM

## 2022-07-10 MED ORDER — IOPAMIDOL (ISOVUE-300) INJECTION 61%
100.0000 mL | Freq: Once | INTRAVENOUS | Status: AC | PRN
  Administered 2022-07-10: 100 mL via INTRAVENOUS

## 2022-07-14 HISTORY — PX: CATARACT EXTRACTION W/ INTRAOCULAR LENS IMPLANT: SHX1309

## 2022-07-23 ENCOUNTER — Other Ambulatory Visit (HOSPITAL_COMMUNITY): Payer: Self-pay

## 2022-08-08 ENCOUNTER — Telehealth: Payer: Self-pay

## 2022-08-08 NOTE — Telephone Encounter (Signed)
Requesting CT scan results, please call pt back. 

## 2022-08-13 NOTE — Progress Notes (Signed)
Patient called and aware of CT a/p without evidence of  obstruction, hydronephrosis or pyelonephritis. Patient's symptoms have since resolved

## 2022-09-26 ENCOUNTER — Telehealth: Payer: Self-pay | Admitting: Internal Medicine

## 2022-09-26 ENCOUNTER — Other Ambulatory Visit: Payer: Self-pay | Admitting: Urology

## 2022-09-26 NOTE — Telephone Encounter (Signed)
   [facility] Pre-operative Risk Assessment    Request for surgical clearance:  What type of surgery is being performed?  Cysto with TURBT Gemcitabine  When is this surgery scheduled?  10/15/22   What type of clearance is required (medical clearance vs. Pharmacy clearance to hold med vs. Both)?  Medical   Are there any medications that need to be held prior to surgery and how long? No    Practice name and name of physician performing surgery?  [facility]  Dr. Liliane Shi   What is your office phone number? [phone] (ext#: [phone])   7.   What is your office fax number? [phone]  8.   Anesthesia type (None, local, MAC, general) ?  General   Rolly Pancake 09/26/2022, 2:33 PM  _________________________________________________________________   (provider comments below)

## 2022-09-26 NOTE — Telephone Encounter (Signed)
Name: Darius [name]  DOB: Oct 17, 1955  MRN: [id number]  Primary Cardiologist: None  Chart reviewed as part of pre-operative protocol coverage. The patient has an upcoming visit scheduled with Dr. Izora Ribas on 10/03/2022 at which time clearance can be addressed in case there are any issues that would impact surgical recommendations.  Cysto with TURBT Gemcitabine is not scheduled until 10/15/2022 as below. I added preop FYI to appointment note so that provider is aware to address at time of outpatient visit.  Per office protocol the cardiology provider should forward their finalized clearance decision and recommendations regarding antiplatelet therapy to the requesting party below.    I will route this message as FYI to requesting party and remove this message from the preop box as separate preop APP input not needed at this time.   Please call with any questions.  Joylene Grapes, NP  09/26/2022, 2:42 PM

## 2022-10-03 ENCOUNTER — Other Ambulatory Visit (HOSPITAL_COMMUNITY): Payer: Self-pay

## 2022-10-03 ENCOUNTER — Encounter: Payer: Self-pay | Admitting: Internal Medicine

## 2022-10-03 ENCOUNTER — Ambulatory Visit: Payer: Commercial Managed Care - HMO | Attending: Internal Medicine | Admitting: Internal Medicine

## 2022-10-03 VITALS — BP 122/60 | HR 52 | Ht 69.0 in | Wt 242.8 lb

## 2022-10-03 DIAGNOSIS — I77819 Aortic ectasia, unspecified site: Secondary | ICD-10-CM

## 2022-10-03 DIAGNOSIS — M352 Behcet's disease: Secondary | ICD-10-CM

## 2022-10-03 DIAGNOSIS — I25118 Atherosclerotic heart disease of native coronary artery with other forms of angina pectoris: Secondary | ICD-10-CM | POA: Diagnosis not present

## 2022-10-03 DIAGNOSIS — I10 Essential (primary) hypertension: Secondary | ICD-10-CM

## 2022-10-03 DIAGNOSIS — I7121 Aneurysm of the ascending aorta, without rupture: Secondary | ICD-10-CM

## 2022-10-03 MED ORDER — ISOSORBIDE MONONITRATE ER 60 MG PO TB24
60.0000 mg | ORAL_TABLET | Freq: Every day | ORAL | 3 refills | Status: DC
Start: 2022-10-03 — End: 2023-10-08
  Filled 2022-10-03 – 2022-10-09 (×2): qty 90, 90d supply, fill #0
  Filled 2023-01-04: qty 90, 90d supply, fill #1
  Filled 2023-04-08: qty 90, 90d supply, fill #2
  Filled 2023-06-30: qty 30, 30d supply, fill #3
  Filled 2023-08-08: qty 30, 30d supply, fill #4
  Filled 2023-09-08: qty 30, 30d supply, fill #5

## 2022-10-03 MED ORDER — LOSARTAN POTASSIUM 25 MG PO TABS
25.0000 mg | ORAL_TABLET | Freq: Every day | ORAL | 3 refills | Status: DC
  Filled 2022-10-03: qty 90, 90d supply, fill #0
  Filled 2023-01-04: qty 90, 90d supply, fill #1
  Filled 2023-04-08: qty 90, 90d supply, fill #2
  Filled 2023-06-30: qty 30, 30d supply, fill #3
  Filled 2023-08-08: qty 30, 30d supply, fill #4
  Filled 2023-09-08: qty 30, 30d supply, fill #5

## 2022-10-03 MED ORDER — ATORVASTATIN CALCIUM 80 MG PO TABS
80.0000 mg | ORAL_TABLET | Freq: Every day | ORAL | 3 refills | Status: DC
Start: 2022-10-03 — End: 2023-10-08
  Filled 2022-10-03: qty 90, 90d supply, fill #0
  Filled 2023-01-04: qty 90, 90d supply, fill #1
  Filled 2023-04-08: qty 90, 90d supply, fill #2
  Filled 2023-06-30: qty 30, 30d supply, fill #3
  Filled 2023-08-08: qty 30, 30d supply, fill #4
  Filled 2023-09-08: qty 30, 30d supply, fill #5

## 2022-10-03 NOTE — Patient Instructions (Signed)
Medication Instructions:   Your physician recommends that you continue on your current medications as directed. Please refer to the Current Medication list given to you today.  *If you need a refill on your cardiac medications before your next appointment, please call your pharmacy*    Testing/Procedures:  CT ANGIO CHEST AORTA WITH/WITHOUT CONTRAST--SCHEDULE THIS TO BE DONE IN APRIL 2025 PER DR. CHANDRASEKHAR    Follow-Up: At [facility], you and your health needs are our priority.  As part of our continuing mission to provide you with exceptional heart care, we have created designated Provider Care Teams.  These Care Teams include your primary Cardiologist (physician) and Advanced Practice Providers (APPs -  Physician Assistants and Nurse Practitioners) who all work together to provide you with the care you need, when you need it.  We recommend signing up for the patient portal called "MyChart".  Sign up information is provided on this After Visit Summary.  MyChart is used to connect with patients for Virtual Visits (Telemedicine).  Patients are able to view lab/test results, encounter notes, upcoming appointments, etc.  Non-urgent messages can be sent to your provider as well.   To learn more about what you can do with MyChart, go to [email or web address].    Your next appointment:   1 year(s)  Provider:   DR. CHANDRASEKHAR OR ERNEST DICK NP

## 2022-10-03 NOTE — Progress Notes (Signed)
Cardiology Office Note:  .    Date:  10/03/2022  ID:  Darius [name], DOB 1955-11-03, MRN [id number] PCP: Jeral Pinch, DO  [facility] Providers Cardiologist:  None     CC: Pre-operative risk stratification  History of Present Illness: .    Darius [name] is Darius 67 y.o. [name] with Darius history of inferior STEMI with residual LAD stenosis treated medically (2019) 2022: With Dr. Katrinka Blazing- mild aortic dilation ~ 40 mm.  Darius [name], Darius 67 year old [name] with Darius history of Behcet's disease, active smoking, hyperlipidemia, and hypertension, presents for cardiac risk stratification following Darius prior STEMI. He had Darius RCA drug-eluting stent placed in 2019 for an inferior STEMI and residual LAD disease that was not intervened upon as he was asymptomatic. He also has Darius mildly dilated aortic root and known aortic atherosclerosis. His last evaluation in 2022 revealed he was still smoking and had hematuria on DAPT as far back as 2020. His last evaluation for his aorta was an ascending aortic aneurysm. He has since been evaluated for this incidentally with Darius CT pulmonary artery protocol from 2023 that showed Darius mild aortic dilation of 40 mm. In addition, he had Darius CT of his abdomen in 2024, which did not show evidence of an abdominal aortic aneurysm. He is presently on atorvastatin 80 mg for cholesterol, which was well managed under 55 in 2022. He also takes mononitrate 60 mg, losartan 25 mg, metoprolol 25 mg, and has PRN nitroglycerin. He has Chantix as Darius prescription for potential smoking cessation. He is being evaluated today for Darius TURP.  Relevant histories: .  Social- just stopped smoking (2024).  Former Programmer, multimedia patient (2022). ROS: As per HPI.   Studies Reviewed: .   Cardiac Studies & Procedures   CARDIAC CATHETERIZATION  CARDIAC CATHETERIZATION 11/30/2017  Narrative  Darius stent was successfully placed.  Darius stent was successfully placed.   Inferior ST elevation myocardial infarction due to 100%  occlusion of the proximal RCA.  Successful therapy with proximal and distal vessel stenting reducing 100% and 80% stenoses to 10% and 0% respectively with improvement in TIMI flow from 0 to III.  22 x 4.0 mm Onyx stents were used with the proximal stent being postdilated to 4.5 mm in diameter.  Normal left main.  LAD contains segmental 60 to 70% mid vessel narrowing.  Large normal ramus intermedius.  Circumflex gives origin to 2 small obtuse marginal branches with Darius second marginal containing 60 to 70% diffuse disease in the mid vessel.  Inferobasal akinesis, overall LVEF greater than 60%.  LVEDP 20.  Delayed reperfusion due to tortuous, angulated innominate/aortic arch anatomy.  Procedure complicated by Bezold-Jarisch response with profound hypotension and bradycardia requiring intravenous atropine and IV Levophed for heart rate and blood pressure support.  RECOMMENDATIONS:   IV fluid at Darius rate of 100 cc/h.  Wean Levophed once blood pressure is consistently above 100 mmHg.  Continue IV ReoPro for 8 hours.  Aspirin and Plavix dual antiplatelet therapy for 12 months.  High intensity statin therapy.  Institute beta-blocker therapy as tolerated by heart rate and blood pressure.  Consider angiotensin converting enzyme inhibitor or angiotensin receptor blocker for blood pressure control if required.  Patient has been on diuretic therapy as monotherapy for hypertension.  Smoking cessation.    Recommend uninterrupted dual antiplatelet therapy with Aspirin 81mg  daily and Ticagrelor 90mg  twice daily for Darius minimum of 12 months (ACS - Class I recommendation).  Findings Coronary Findings Diagnostic  Dominance: Right  Left Anterior Descending Prox LAD lesion is 65% stenosed.  Left Circumflex  Second Obtuse Marginal Branch 2nd Mrg lesion is 70% stenosed.  Right Coronary Artery Prox RCA lesion is 100% stenosed. Mid RCA lesion is 80% stenosed.  Right Posterior Atrioventricular  Artery Post Atrio lesion is 100% stenosed.  Intervention  Prox RCA lesion Stent Darius stent was successfully placed. Post-Intervention Lesion Assessment The intervention was successful. There is Darius 10% residual stenosis post intervention.  Mid RCA lesion Stent Darius stent was successfully placed. Post-stent angioplasty was performed. Post-Intervention Lesion Assessment The intervention was successful. There is Darius 0% residual stenosis post intervention.   STRESS TESTS  MYOCARDIAL PERFUSION IMAGING 05/06/2022  Narrative   Findings are consistent with ischemia. The study is intermediate risk.   No ST deviation was noted.   LV perfusion is abnormal. There is evidence of ischemia. There is no evidence of infarction. Defect 1: There is Darius medium defect with mild reduction in uptake present in the mid to basal inferolateral location(s) that is reversible. There is normal wall motion in the defect area. Consistent with ischemia.   Left ventricular function is normal. Nuclear stress EF: 55 %. The left ventricular ejection fraction is normal (55-65%). End diastolic cavity size is normal. End systolic cavity size is normal.   ECHOCARDIOGRAM  ECHOCARDIOGRAM COMPLETE 05/02/2022  Narrative ECHOCARDIOGRAM REPORT    Patient Name:   Darius [name] [facility] Date of Exam: 05/02/2022 Medical Rec #:  [id number]      Height:       69.0 in Accession #:    [id number]     Weight:       252.4 lb Date of Birth:  [birth year]      BSA:          2.281 m Patient Age:    66 years       BP:           138/72 mmHg Patient Gender: M              HR:           58 bpm. Exam Location:  [address]  Procedure: 2D Echo, 3D Echo, Cardiac Doppler, Color Doppler and Strain Analysis  Indications:    R06.00 Dyspnea  History:        Patient has prior history of Echocardiogram examinations, most recent 05/03/2020. Previous Myocardial Infarction and CAD, STENT; COPD. AAA.  Sonographer:    Daphine Deutscher RDCS Referring Phys:  [id number] Devoria Albe DICK  IMPRESSIONS   1. Left ventricular ejection fraction, by estimation, is 50 to 55%. Left ventricular ejection fraction by 3D volume is 54 %. The left ventricle has low normal function. The left ventricle has no regional wall motion abnormalities. There is moderate left ventricular hypertrophy. Left ventricular diastolic parameters are indeterminate. The average left ventricular global longitudinal strain is -15.7 %. The global longitudinal strain is normal. 2. Right ventricular systolic function is normal. The right ventricular size is moderately enlarged. 3. The mitral valve is normal in structure. No evidence of mitral valve regurgitation. No evidence of mitral stenosis. 4. The aortic valve is normal in structure. Aortic valve regurgitation is not visualized. No aortic stenosis is present. 5. Aortic dilatation noted. There is mild dilatation of the ascending aorta, measuring 42 mm. 6. The inferior vena cava is normal in size with greater than 50% respiratory variability, suggesting right atrial pressure of 3 mmHg.  Comparison(s): Prior images reviewed side by side.  FINDINGS Left Ventricle: Left  ventricular ejection fraction, by estimation, is 50 to 55%. Left ventricular ejection fraction by 3D volume is 54 %. The left ventricle has low normal function. The left ventricle has no regional wall motion abnormalities. The average left ventricular global longitudinal strain is -15.7 %. The global longitudinal strain is normal. The left ventricular internal cavity size was normal in size. There is moderate left ventricular hypertrophy. Left ventricular diastolic parameters are indeterminate.  Right Ventricle: The right ventricular size is moderately enlarged. No increase in right ventricular wall thickness. Right ventricular systolic function is normal.  Left Atrium: Left atrial size was normal in size.  Right Atrium: Right atrial size was normal in size.  Pericardium:  There is no evidence of pericardial effusion.  Mitral Valve: The mitral valve is normal in structure. No evidence of mitral valve regurgitation. No evidence of mitral valve stenosis.  Tricuspid Valve: The tricuspid valve is normal in structure. Tricuspid valve regurgitation is not demonstrated. No evidence of tricuspid stenosis.  Aortic Valve: The aortic valve is normal in structure. Aortic valve regurgitation is not visualized. No aortic stenosis is present.  Pulmonic Valve: The pulmonic valve was normal in structure. Pulmonic valve regurgitation is trivial. No evidence of pulmonic stenosis.  Aorta: Aortic dilatation noted. There is mild dilatation of the ascending aorta, measuring 42 mm.  Venous: The inferior vena cava is normal in size with greater than 50% respiratory variability, suggesting right atrial pressure of 3 mmHg.  IAS/Shunts: No atrial level shunt detected by color flow Doppler.   LEFT VENTRICLE PLAX 2D LVIDd:         4.60 cm         Diastology LVIDs:         3.45 cm         LV e' medial:    5.98 cm/s LV PW:         1.40 cm         LV E/e' medial:  13.1 LV IVS:        1.40 cm         LV e' lateral:   7.83 cm/s LVOT diam:     2.40 cm         LV E/e' lateral: 10.0 LV SV:         97 LV SV Index:   42              2D LVOT Area:     4.52 cm        Longitudinal Strain 2D Strain GLS  -15.0 % (A2C): 2D Strain GLS  -15.9 % (A3C): 2D Strain GLS  -16.2 % (A4C): 2D Strain GLS  -15.7 % Avg:  3D Volume EF LV 3D EF:    Left ventricul ar ejection fraction by 3D volume is 54 %.  3D Volume EF: 3D EF:        54 % LV EDV:       176 ml LV ESV:       80 ml LV SV:        96 ml  RIGHT VENTRICLE             IVC RV Basal diam:  4.50 cm     IVC diam: 1.50 cm RV S prime:     12.60 cm/s TAPSE (M-mode): 2.9 cm  LEFT ATRIUM             Index        RIGHT ATRIUM  Index LA diam:        4.90 cm 2.15 cm/m   RA Area:     20.00 cm LA Vol (A2C):   61.9 ml 27.14 ml/m   RA Volume:   61.40 ml  26.92 ml/m LA Vol (A4C):   63.4 ml 27.80 ml/m LA Biplane Vol: 63.3 ml 27.75 ml/m AORTIC VALVE LVOT Vmax:   82.30 cm/s LVOT Vmean:  54.500 cm/s LVOT VTI:    0.214 m  AORTA Ao Root diam: 4.20 cm Ao Asc diam:  4.20 cm  MITRAL VALVE               TRICUSPID VALVE MV Area (PHT): 3.72 cm    TR Peak grad:   18.1 mmHg MV Decel Time: 204 msec    TR Vmax:        213.00 cm/s MV E velocity: 78.35 cm/s MV Darius velocity: 64.95 cm/s  SHUNTS MV E/Darius ratio:  1.21        Systemic VTI:  0.21 m Systemic Diam: 2.40 cm  Donato Schultz MD Electronically signed by Donato Schultz MD Signature Date/Time: 05/02/2022/12:30:49 PM    Final             Physical Exam:    VS:  BP 122/60   Pulse (!) 52   Ht 5\' 9"  (1.753 m)   Wt 242 lb 12.8 oz (110.1 kg)   SpO2 93%   BMI 35.86 kg/m    Wt Readings from Last 3 Encounters:  10/03/22 242 lb 12.8 oz (110.1 kg)  06/23/22 248 lb 12.8 oz (112.9 kg)  05/02/22 252 lb (114.3 kg)    Gen: no distress, morbid obesity  Neck: No JVD Ears: no Homero Fellers Sign Cardiac: No Rubs or Gallops, no murmur, regular bradycardia radial pulses Respiratory: Clear to auscultation bilaterally, normal effort, normal  respiratory rate GI: Soft, nontender, non-distended  MS: No edema;  moves all extremities Integument: Skin feels warm Neuro:  At time of evaluation, alert and oriented to person/place/time/situation  Psych: Normal affect, patient feels well   ASSESSMENT AND PLAN: .    Coronary Artery Disease - Prior inferior STEMI with RCA stent in 2019 and residual LAD disease. Asymptomatic with good functional capacity. -Continue current medications:Atorvastatin 80mg  daily, Isosorbide Mononitrate 60mg  daily, Losartan 25mg  daily, Metoprolol 25mg  BID, and PRN Nitroglycerin.  Urologic Tumor - Recurrent urologic tumor with planned cystoscopy and local medication administration. Low cardiac risk for the procedure. - functional METS 5.62 - will not stress; reasonable  to proceed, he can hold his ASA for procedure - the gemcitabine is local and should not increase his risk of cardiac dysfunction  Aortic Dilation - Mildly dilated aortic root with aortic atherosclerosis. Stable size on recent echocardiogram. -Plan for repeat CT in 2025 for surveillance.  Smoking Cessation - Recent cessation of smoking. - Continue Chantix as prescribed for smoking cessation.  Hyperlipidemia Morbid Obesity - Well controlled on Atorvastatin 80mg  daily with LDL under 55 in 2022. -Continue Atorvastatin 80mg  daily. - discussed lifestyle intervention  Hypertension Managed on Losartan 25mg  daily and Metoprolol 25mg  BID. -Continue current antihypertensive regimen.   Riley Lam, MD FASE [facility] Cardiologist [facility]  [address] [place], [address] [place], [place] [address] [phone]  9:57 AM

## 2022-10-07 ENCOUNTER — Encounter (HOSPITAL_BASED_OUTPATIENT_CLINIC_OR_DEPARTMENT_OTHER): Payer: Self-pay | Admitting: Urology

## 2022-10-08 ENCOUNTER — Encounter (HOSPITAL_BASED_OUTPATIENT_CLINIC_OR_DEPARTMENT_OTHER): Payer: Self-pay | Admitting: Urology

## 2022-10-08 NOTE — Progress Notes (Signed)
Spoke w/ via phone for pre-op interview--- pt Lab needs dos----   [employer] results------ current EKG in epic/ chart COVID test -----patient states asymptomatic no test needed Arrive at ------- 0730 on 10-15-2022 NPO after MN NO Solid Food.  Clear liquids from MN until--- 0630 Med rec completed Medications to take morning of surgery ----- imdur, lopressor Diabetic medication ----- n/a Patient instructed no nail polish to be worn day of surgery Patient instructed to bring photo id and insurance card day of surgery Patient aware to have Driver (ride ) / caregiver    for 24 hours after surgery - wife, Darius [name] Patient Special Instructions ----- n/a Pre-Op special Instructions ----- pt has cardiologist office visit clearance by Dr Izora Ribas on 10-03-2022 in epic/ chart Patient verbalized understanding of instructions that were given at this phone interview. Patient denies chest pain, sob, fever, cough at the interview.    Anesthesia Review: HTN;  CAD hx inferior STEMI 11/ 2019 s/p PCI w/ DES to RCA; chronic stable angina;   AAA 42 mm;  Emphysema/ copd Pt denies cardiac s&s, no peripheral swelling, but does get sob w/ stairs recovers quickly (whish is usual for him). Pt stated last taken nitroglycerine > 6 months ago.   PCP:  Dr Elvera Lennox. [name] Darius Arista 06-23-2022) Cardiologist : Dr Izora Ribas [facility] 10-03-2022) Chest x-ray : CT 01-18-2018/ CTA 11-13-2021 EKG : 01-20-2022 Echo : 04-29-2022 Stress test: nuclear 05-06-2022 Cardiac Cath :  11-30-2017 Activity level: see above Sleep Study/ CPAP : no Blood Thinner/ Instructions /Last Dose: no ASA / Instructions/ Last Dose : per pt is taking any ASA

## 2022-10-09 ENCOUNTER — Other Ambulatory Visit (HOSPITAL_COMMUNITY): Payer: Self-pay

## 2022-10-15 ENCOUNTER — Ambulatory Visit (HOSPITAL_BASED_OUTPATIENT_CLINIC_OR_DEPARTMENT_OTHER)
Admission: RE | Admit: 2022-10-15 | Discharge: 2022-10-15 | Disposition: A | Discharge status: Home / Self Care | Payer: Commercial Managed Care - HMO | Attending: Urology | Admitting: Urology

## 2022-10-15 ENCOUNTER — Other Ambulatory Visit (HOSPITAL_COMMUNITY): Payer: Self-pay

## 2022-10-15 ENCOUNTER — Ambulatory Visit (HOSPITAL_BASED_OUTPATIENT_CLINIC_OR_DEPARTMENT_OTHER): Payer: Commercial Managed Care - HMO | Admitting: Anesthesiology

## 2022-10-15 ENCOUNTER — Encounter (HOSPITAL_BASED_OUTPATIENT_CLINIC_OR_DEPARTMENT_OTHER)
Admission: RE | Disposition: A | Discharge status: Home / Self Care | Payer: Self-pay | Source: Home / Self Care | Attending: Urology

## 2022-10-15 ENCOUNTER — Other Ambulatory Visit: Payer: Self-pay

## 2022-10-15 ENCOUNTER — Encounter (HOSPITAL_BASED_OUTPATIENT_CLINIC_OR_DEPARTMENT_OTHER): Payer: Self-pay | Admitting: Urology

## 2022-10-15 DIAGNOSIS — I10 Essential (primary) hypertension: Secondary | ICD-10-CM | POA: Diagnosis not present

## 2022-10-15 DIAGNOSIS — C679 Malignant neoplasm of bladder, unspecified: Secondary | ICD-10-CM | POA: Diagnosis present

## 2022-10-15 DIAGNOSIS — Z01818 Encounter for other preprocedural examination: Secondary | ICD-10-CM

## 2022-10-15 DIAGNOSIS — Z951 Presence of aortocoronary bypass graft: Secondary | ICD-10-CM | POA: Insufficient documentation

## 2022-10-15 DIAGNOSIS — F17210 Nicotine dependence, cigarettes, uncomplicated: Secondary | ICD-10-CM | POA: Diagnosis not present

## 2022-10-15 DIAGNOSIS — I252 Old myocardial infarction: Secondary | ICD-10-CM | POA: Insufficient documentation

## 2022-10-15 DIAGNOSIS — C674 Malignant neoplasm of posterior wall of bladder: Secondary | ICD-10-CM

## 2022-10-15 DIAGNOSIS — Z955 Presence of coronary angioplasty implant and graft: Secondary | ICD-10-CM | POA: Insufficient documentation

## 2022-10-15 HISTORY — PX: CYSTOSCOPY: SHX5120

## 2022-10-15 LAB — POCT I-STAT, CHEM 8
BUN: 10 mg/dL (ref 8–23)
Calcium, Ion: 1.21 mmol/L (ref 1.15–1.40)
Chloride: 106 mmol/L (ref 98–111)
Creatinine, Ser: 0.7 mg/dL (ref 0.61–1.24)
Glucose, Bld: 148 mg/dL — ABNORMAL HIGH (ref 70–99)
HCT: 45 % (ref 39.0–52.0)
Hemoglobin: 15.3 g/dL (ref 13.0–17.0)
Potassium: 4.3 mmol/L (ref 3.5–5.1)
Sodium: 140 mmol/L (ref 135–145)
TCO2: 22 mmol/L (ref 22–32)

## 2022-10-15 SURGERY — TURBT, WITH CHEMOTHERAPEUTIC AGENT INSTILLATION INTO BLADDER
Anesthesia: General | Site: Bladder

## 2022-10-15 MED ORDER — DEXAMETHASONE SODIUM PHOSPHATE 10 MG/ML IJ SOLN
INTRAMUSCULAR | Status: DC | PRN
  Administered 2022-10-15: 4 mg via INTRAVENOUS

## 2022-10-15 MED ORDER — ACETAMINOPHEN 500 MG PO TABS
ORAL_TABLET | ORAL | Status: AC
  Filled 2022-10-15: qty 2

## 2022-10-15 MED ORDER — CEFAZOLIN SODIUM-DEXTROSE 2-4 GM/100ML-% IV SOLN
INTRAVENOUS | Status: AC
  Filled 2022-10-15: qty 100

## 2022-10-15 MED ORDER — LACTATED RINGERS IV SOLN
INTRAVENOUS | Status: DC
  Administered 2022-10-15: 1000 mL via INTRAVENOUS

## 2022-10-15 MED ORDER — MIDAZOLAM HCL 5 MG/5ML IJ SOLN
INTRAMUSCULAR | Status: DC | PRN
  Administered 2022-10-15: 2 mg via INTRAVENOUS

## 2022-10-15 MED ORDER — GLYCOPYRROLATE 0.2 MG/ML IJ SOLN
INTRAMUSCULAR | Status: DC | PRN
Start: 2022-10-15 — End: 2022-10-15
  Administered 2022-10-15: .2 mg via INTRAVENOUS

## 2022-10-15 MED ORDER — OXYBUTYNIN CHLORIDE 5 MG PO TABS
5.0000 mg | ORAL_TABLET | Freq: Three times a day (TID) | ORAL | 1 refills | Status: DC | PRN
  Filled 2022-10-15: qty 30, 10d supply, fill #0

## 2022-10-15 MED ORDER — SODIUM CHLORIDE 0.9 % IR SOLN
Status: DC | PRN
Start: 2022-10-15 — End: 2022-10-15
  Administered 2022-10-15: 3000 mL via INTRAVESICAL

## 2022-10-15 MED ORDER — MIDAZOLAM HCL 2 MG/2ML IJ SOLN
INTRAMUSCULAR | Status: AC
  Filled 2022-10-15: qty 2

## 2022-10-15 MED ORDER — PHENAZOPYRIDINE HCL 200 MG PO TABS
200.0000 mg | ORAL_TABLET | Freq: Three times a day (TID) | ORAL | 0 refills | Status: AC | PRN
  Filled 2022-10-15: qty 30, 10d supply, fill #0

## 2022-10-15 MED ORDER — GLYCOPYRROLATE PF 0.2 MG/ML IJ SOSY
PREFILLED_SYRINGE | INTRAMUSCULAR | Status: AC
  Filled 2022-10-15: qty 1

## 2022-10-15 MED ORDER — OXYCODONE HCL 5 MG PO TABS: 5.0000 mg | ORAL_TABLET | Freq: Once | ORAL | Status: DC | PRN

## 2022-10-15 MED ORDER — OXYCODONE HCL 5 MG/5ML PO SOLN: 5.0000 mg | Freq: Once | ORAL | Status: DC | PRN

## 2022-10-15 MED ORDER — DEXAMETHASONE SODIUM PHOSPHATE 10 MG/ML IJ SOLN
INTRAMUSCULAR | Status: AC
  Filled 2022-10-15: qty 1

## 2022-10-15 MED ORDER — CEFAZOLIN SODIUM-DEXTROSE 2-4 GM/100ML-% IV SOLN
2.0000 g | INTRAVENOUS | Status: AC
  Administered 2022-10-15: 2 g via INTRAVENOUS

## 2022-10-15 MED ORDER — FENTANYL CITRATE (PF) 100 MCG/2ML IJ SOLN
INTRAMUSCULAR | Status: AC
  Filled 2022-10-15: qty 2

## 2022-10-15 MED ORDER — ACETAMINOPHEN 500 MG PO TABS
1000.0000 mg | ORAL_TABLET | Freq: Once | ORAL | Status: AC
  Administered 2022-10-15: 1000 mg via ORAL

## 2022-10-15 MED ORDER — GEMCITABINE CHEMO FOR BLADDER INSTILLATION 2000 MG
2000.0000 mg | Freq: Once | INTRAVENOUS | Status: AC
  Administered 2022-10-15: 2000 mg via INTRAVESICAL
  Filled 2022-10-15: qty 2000

## 2022-10-15 MED ORDER — PROPOFOL 10 MG/ML IV BOLUS
INTRAVENOUS | Status: DC | PRN
  Administered 2022-10-15: 200 mg via INTRAVENOUS

## 2022-10-15 MED ORDER — PROPOFOL 10 MG/ML IV BOLUS
INTRAVENOUS | Status: AC
  Filled 2022-10-15: qty 20

## 2022-10-15 MED ORDER — STERILE WATER FOR IRRIGATION IR SOLN
Status: DC | PRN
  Administered 2022-10-15: 500 mL

## 2022-10-15 MED ORDER — ONDANSETRON HCL 4 MG/2ML IJ SOLN
INTRAMUSCULAR | Status: DC | PRN
  Administered 2022-10-15: 4 mg via INTRAVENOUS

## 2022-10-15 MED ORDER — FENTANYL CITRATE (PF) 100 MCG/2ML IJ SOLN: 25.0000 ug | INTRAMUSCULAR | Status: DC | PRN

## 2022-10-15 MED ORDER — ONDANSETRON HCL 4 MG/2ML IJ SOLN
INTRAMUSCULAR | Status: AC
  Filled 2022-10-15: qty 2

## 2022-10-15 MED ORDER — LIDOCAINE 2% (20 MG/ML) 5 ML SYRINGE
INTRAMUSCULAR | Status: DC | PRN
  Administered 2022-10-15: 100 mg via INTRAVENOUS

## 2022-10-15 MED ORDER — LIDOCAINE HCL (PF) 2 % IJ SOLN
INTRAMUSCULAR | Status: AC
  Filled 2022-10-15: qty 5

## 2022-10-15 MED ORDER — FENTANYL CITRATE (PF) 100 MCG/2ML IJ SOLN
INTRAMUSCULAR | Status: DC | PRN
  Administered 2022-10-15: 25 ug via INTRAVENOUS
  Administered 2022-10-15: 50 ug via INTRAVENOUS

## 2022-10-15 SURGICAL SUPPLY — 21 items
BAG DRAIN URO-CYSTO SKYTR STRL (DRAIN) ×1 IMPLANT
BAG DRN RND TRDRP ANRFLXCHMBR (UROLOGICAL SUPPLIES) ×1
BAG DRN UROCATH (DRAIN) ×1
BAG URINE DRAIN 2000ML AR STRL (UROLOGICAL SUPPLIES) IMPLANT
CATH FOLEY 2WAY SLVR 5CC 18FR (CATHETERS) IMPLANT
CATH ROBINSON RED A/P 14FR (CATHETERS) IMPLANT
CLOTH BEACON ORANGE TIMEOUT ST (SAFETY) ×1 IMPLANT
ELECT REM PT RETURN 9FT ADLT (ELECTROSURGICAL) ×1
ELECTRODE REM PT RTRN 9FT ADLT (ELECTROSURGICAL) ×1 IMPLANT
GLOVE BIO SURGEON STRL SZ7.5 (GLOVE) ×1 IMPLANT
GOWN STRL REUS W/TWL LRG LVL3 (GOWN DISPOSABLE) ×3 IMPLANT
KIT TURNOVER CYSTO (KITS) ×1 IMPLANT
LOOP CUT BIPOLAR 24F LRG (ELECTROSURGICAL) IMPLANT
MANIFOLD NEPTUNE II (INSTRUMENTS) ×1 IMPLANT
NDL SAFETY ECLIP 18X1.5 (MISCELLANEOUS) IMPLANT
PACK CYSTO (CUSTOM PROCEDURE TRAY) ×1 IMPLANT
SLEEVE SCD COMPRESS KNEE MED (STOCKING) ×1 IMPLANT
SYR 20ML LL LF (SYRINGE) IMPLANT
TUBE CONNECTING 12X1/4 (SUCTIONS) ×1 IMPLANT
WATER STERILE IRR 3000ML UROMA (IV SOLUTION) ×1 IMPLANT
WATER STERILE IRR 500ML POUR (IV SOLUTION) IMPLANT

## 2022-10-15 NOTE — Anesthesia Preprocedure Evaluation (Addendum)
Anesthesia Evaluation  Patient identified by MRN, date of birth, ID band Patient awake    Reviewed: Allergy & Precautions, NPO status , Patient's Chart, lab work & pertinent test results  History of Anesthesia Complications Negative for: history of anesthetic complications  Airway Mallampati: III  TM Distance: >3 FB Neck ROM: Full    Dental  (+) Dental Advisory Given,    Pulmonary neg shortness of breath, neg sleep apnea, COPD (no recent flares), neg recent URI, Patient abstained from smoking., former smoker   Pulmonary exam normal breath sounds clear to auscultation       Cardiovascular hypertension (ISMN, losartan, metoprolol), Pt. on medications and Pt. on home beta blockers (-) angina + CAD, + Past MI (STEMI 11/2017) and + Cardiac Stents (DES to proximal RCA 11/2017)  + dysrhythmias (incomplete RBBB)  Rhythm:Regular Rate:Bradycardia  HLD, aortic dilation, AAA  Stress Test 05/02/2022: - Imdur increased afterwards   Findings are consistent with ischemia. The study is intermediate risk.   No ST deviation was noted.   LV perfusion is abnormal. There is evidence of ischemia. There is no evidence of infarction. Defect 1: There is a medium defect with mild reduction in uptake present in the mid to basal inferolateral location(s) that is reversible. There is normal wall motion in the defect area. Consistent with ischemia.   Left ventricular function is normal. Nuclear stress EF: 55 %. The left ventricular ejection fraction is normal (55-65%). End diastolic cavity size is normal. End systolic cavity size is normal.  TTE 05/02/2022: IMPRESSIONS     1. Left ventricular ejection fraction, by estimation, is 50 to 55%. Left  ventricular ejection fraction by 3D volume is 54 %. The left ventricle has  low normal function. The left ventricle has no regional wall motion  abnormalities. There is moderate left   ventricular hypertrophy. Left  ventricular diastolic parameters are  indeterminate. The average left ventricular global longitudinal strain is  -15.7 %. The global longitudinal strain is normal.   2. Right ventricular systolic function is normal. The right ventricular  size is moderately enlarged.   3. The mitral valve is normal in structure. No evidence of mitral valve  regurgitation. No evidence of mitral stenosis.   4. The aortic valve is normal in structure. Aortic valve regurgitation is  not visualized. No aortic stenosis is present.   5. Aortic dilatation noted. There is mild dilatation of the ascending  aorta, measuring 42 mm.   6. The inferior vena cava is normal in size with greater than 50%  respiratory variability, suggesting right atrial pressure of 3 mmHg.      Neuro/Psych neg Seizures BPPV    GI/Hepatic negative GI ROS, Neg liver ROS,,,  Endo/Other  neg diabetes  Thyroid mass  Renal/GU negative Renal ROS   Bladder cancer    Musculoskeletal   Abdominal  (+) + obese  Peds  Hematology negative hematology ROS (+)   Anesthesia Other Findings Behcet's disease  Reproductive/Obstetrics                              Anesthesia Physical Anesthesia Plan  ASA: 3  Anesthesia Plan: General   Post-op Pain Management: Tylenol PO (pre-op)*   Induction: Intravenous  PONV Risk Score and Plan: 2 and Ondansetron, Dexamethasone and Treatment may vary due to age or medical condition  Airway Management Planned: LMA  Additional Equipment:   Intra-op Plan:   Post-operative Plan: Extubation in OR  Informed Consent: I have reviewed the patients History and Physical, chart, labs and discussed the procedure including the risks, benefits and alternatives for the proposed anesthesia with the patient or authorized representative who has indicated his/her understanding and acceptance.     Dental advisory given  Plan Discussed with: CRNA and Anesthesiologist  Anesthesia Plan  Comments: (Risks of general anesthesia discussed including, but not limited to, sore throat, hoarse voice, chipped/damaged teeth, injury to vocal cords, nausea and vomiting, allergic reactions, lung infection, heart attack, stroke, and death. All questions answered. )         Anesthesia Quick Evaluation

## 2022-10-15 NOTE — H&P (Signed)
Office Visit Report     09/25/2022   --------------------------------------------------------------------------------   Darius [name]  MRN: [id number]  DOB: 11-15-1955, 67 year old Male  PRIMARY CARE:  Erlinda Hong, MD  PRIMARY CARE FAX:  [phone]  REFERRING:  Phillips Odor  PROVIDER:  Marcine Matar, M.D.  TREATING:  Rhoderick Moody, M.D.  LOCATION:  [facility] [phone]     --------------------------------------------------------------------------------   CC/HPI: Bladder cancer   Darius [name] is a 67 year old male, previously followed by Dr. Retta Diones with a history of low-grade TA urothelial carcinoma the bladder that was initially diagnosed in 2019 following resection of a greater than 2 cm mass. He had a subsequent UCC recurrence in 2022 and 01/2022 with the same pathology. No prior BCG treatments.   09/25/2022: The patient is here today for routine follow-up and surveillance cystoscopy. He has done well over the past several months and denies interval UTIs, dysuria or hematuria.     ALLERGIES: No Allergies    MEDICATIONS: Viagra 100 mg tablet 1/2 tablet PO prn  Atorvastatin Calcium 80 mg tablet  Clopidogrel 75 mg tablet  Diclofenac Sodium 1 % gel  Isosorbide Mononitrate Er 60 mg tablet, extended release 24 hr  Losartan Potassium 25 mg tablet  Nitroglycerin 0.4 mg tablet, sublingual     GU PSH: Bladder Instill AntiCA Agent - 01/20/2022, 12/17/2020, 2022 Cystoscopy - 05/23/2022, 12/25/2021, 06/26/2021, 11/30/2020, 2022, 2022, 2021, 2020, 2019 Cystoscopy Irrigate Clot - 2019 Cystoscopy TURBT <2 cm - 01/20/2022, 12/17/2020 Cystoscopy TURBT >5 cm - 2019 Cystoscopy TURBT 2-5 cm - 2022 Insert Bladder Cath; Complex - 2019       PSH Notes: Hernia Repair   NON-GU PSH: CABG (coronary artery bypass grafting) Cardiac Stent Placement Hernia Repair - 2014     GU PMH: Bladder Cancer overlapping sites, History of recurrent low-grade nonmuscle  invasive bladder cancer. Currently cystoscopy fine. - 05/23/2022, Recently underwent his fourth TURBT for recurrent low-grade nonmuscle invasive bladder cancer. He is a long-term smoker but is trying to quit (his wife still smokes in the house), - 02/14/2022 Bladder Cancer Anterior - 12/25/2021 Bladder Cancer Lateral, No evidence of recurrence today. He is still smoking. - 06/26/2021, Several recurrences, status post recent repeat TURBT, similar low-grade nonmuscle invasive bladder cancer, - 2023, He has recurrences, - 11/30/2020, No evidence of recurrence , - 2022, He was noted to have 2 recurrences on cystoscopy today., - 2022, No evidence of recurrence based on cystoscopy today., - 2021 (Stable), Cystoscopy today is clear, - 2020 (Worsening), 2 small recurrences, both treated with electro cautery today., - 2020 (Worsening), There was a small recurrence at the bladder neck today that was treated with cautery, - 2020, Large low-grade papillary noninvasive bladder cancer, resected on 12.4.2019. He is doing well., - 2019 ED due to arterial insufficiency, He would like to try PDE 5 inhibitors. - 2020 Bladder tumor/neoplasm - 2019 Gross hematuria - 2019 BPH w/o LUTS, Benign prostate hyperplasia - 2014 Encounter for Prostate Cancer screening, Prostate cancer screening - 2014 Nocturia, Nocturia - 2014 Spermatocele of epididymis, Unspec, Spermatocele - 2014    NON-GU PMH: Encounter for general adult medical examination without abnormal findings, Encounter for preventive health examination - 2014 Depression Hypertension Myocardial Infarction    FAMILY HISTORY: 1 Daughter - Daughter 2 sons - Son Blood In Urine - Runs in Family Death In The Family Father - Father Death In The Family Mother - Mother Death of family member - [facility]   SOCIAL  HISTORY: Marital Status: Married Ethnicity: Not Hispanic Or Latino Current Smoking Status: Patient does not smoke anymore. Has not smoked since 11/14/2007.  Smoked for 20 years. Smoked 1 pack per day.   Tobacco Use Assessment Completed: Used Tobacco in last 30 days? Has never drank.  Does not drink caffeine. 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     Notes: Number of children, Occupation, Caffeine use, Alcohol use, Married, Current every day smoker   REVIEW OF SYSTEMS:    GU Review Male:   Patient denies frequent urination, hard to postpone urination, burning/ pain with urination, get up at night to urinate, leakage of urine, stream starts and stops, trouble starting your stream, have to strain to urinate , erection problems, and penile pain.  Gastrointestinal (Upper):   Patient denies nausea, vomiting, and indigestion/ heartburn.  Gastrointestinal (Lower):   Patient denies diarrhea and constipation.  Constitutional:   Patient denies fever, night sweats, weight loss, and fatigue.  Skin:   Patient denies skin rash/ lesion and itching.  Eyes:   Patient denies double vision and blurred vision.  Ears/ Nose/ Throat:   Patient denies sore throat and sinus problems.  Hematologic/Lymphatic:   Patient denies swollen glands and easy bruising.  Cardiovascular:   Patient denies leg swelling and chest pains.  Respiratory:   Patient denies cough and shortness of breath.  Endocrine:   Patient denies excessive thirst.  Musculoskeletal:   Patient denies back pain and joint pain.  Neurological:   Patient denies headaches and dizziness.  Psychologic:   Patient denies depression and anxiety.   VITAL SIGNS: None   Complexity of Data:  Records Review:   Pathology Reports, Previous Hospital Records, Previous Patient Records   11/20/12  PSA  Total PSA 0.58     PROCEDURES:         Flexible Cystoscopy - 52000  Risks, benefits, and some of the potential complications of the procedure were discussed at length with the patient including infection, bleeding, voiding discomfort, urinary retention, fever, chills, sepsis, and others. All questions were answered.  Informed consent was obtained. Antibiotic prophylaxis was given. Sterile technique and intraurethral analgesia were used.  Meatus:  Normal size. Normal location. Normal condition.  Urethra:  No strictures.  External Sphincter:  Normal.  Verumontanum:  Normal.  Prostate:  Borderline obstructing. No hyperplasia.  Bladder Neck:  Non-obstructing.  Ureteral Orifices:  Normal location. Normal size. Normal shape. Effluxed clear urine.  Bladder:  A right lateral wall tumor. < 1/2 cm tumor. Solitary tumor. No trabeculation. Normal mucosa. No stones.      The lower urinary tract was carefully examined. The procedure was well-tolerated and without complications. Antibiotic instructions were given. Instructions were given to call the office immediately for bloody urine, difficulty urinating, urinary retention, painful or frequent urination, fever, chills, nausea, vomiting or other illness. The patient stated that he understood these instructions and would comply with them.         Urinalysis w/Scope Dipstick Dipstick Cont'd Micro  Color: Yellow Bilirubin: Neg mg/dL WBC/hpf: NS (Not Seen)  Appearance: Clear Ketones: Neg mg/dL RBC/hpf: 0 - 2/hpf  Specific Gravity: 1.025 Blood: Neg ery/uL Bacteria: NS (Not Seen)  pH: 6.5 Protein: 1+ mg/dL Cystals: NS (Not Seen)  Glucose: Neg mg/dL Urobilinogen: 1.0 mg/dL Casts: NS (Not Seen)    Nitrites: Neg Trichomonas: Not Present    Leukocyte Esterase: Neg leu/uL Mucous: Present      Epithelial Cells: 0 - 5/hpf      Yeast: NS (Not Seen)  Sperm: Not Present    ASSESSMENT:      ICD-10 Details  1 GU:   History of bladder cancer - Z85.51      PLAN:           Schedule Return Visit/Planned Activity: Next Available Appointment - Schedule Surgery          Document Letter(s):  Created for Patient: Clinical Summary   Created for Erlinda Hong, MD         Notes:   -Cystoscopy revealed a 5 mm papillary bladder tumor recurrence adjacent to his previous  resection site along the right lateral sidewall.   -The risks, benefits and alternatives of cystoscopy with TURBT with gemcitabine instillation was discussed with the patient. The risks include, but are not limited to, bleeding, urinary tract infection, bladder perforation requiring prolonged catheterization and/or open bladder repair, ureteral obstruction, voiding dysfunction and the inherent risks of general anesthesia. The patient voices understanding and wishes to proceed.

## 2022-10-15 NOTE — Anesthesia Postprocedure Evaluation (Signed)
Anesthesia Post Note  Patient: Darius [name]  Procedure(s) Performed: TRANSURETHRAL RESECTION OF BLADDER TUMOR (TURBT) with GEMCITABINE (Bladder) CYSTOSCOPY (Bladder)     Patient location during evaluation: PACU Anesthesia Type: General Level of consciousness: awake Pain management: pain level controlled Vital Signs Assessment: post-procedure vital signs reviewed and stable Respiratory status: spontaneous breathing, nonlabored ventilation and respiratory function stable Cardiovascular status: blood pressure returned to baseline and stable Postop Assessment: no apparent nausea or vomiting Anesthetic complications: no   No notable events documented.  Last Vitals:  Vitals:   10/15/22 1030 10/15/22 1045  BP: 112/69 121/64  Pulse: (!) 56 (!) 46  Resp: 10 14  Temp:    SpO2: 98% 93%    Last Pain:  Vitals:   10/15/22 1045  TempSrc:   PainSc: 0-No pain                 Linton Rump

## 2022-10-15 NOTE — Op Note (Signed)
Operative Note  Preoperative diagnosis:  1.  Recurrent urothelial carcinoma the bladder  Postoperative diagnosis: 1.  Recurrent urothelial carcinoma the bladder  Procedure(s): 1.  Cystoscopy with TURBT (small) 2.  Intravesical instillation of gemcitabine  Surgeon: Rhoderick Moody, MD  Assistants:  None  Anesthesia:  General  Complications:  None  EBL: Less than 5 mL  Specimens: 1.  Posterior bladder wall tumor  Drains/Catheters: 1.  18 French Foley catheter  Intraoperative findings:   5 mm papillary bladder tumor recurrence involving the posterior bladder wall previous resection site.  No other intravesical or urethral abnormalities were seen  Indication:  Darius [name] is a 67 y.o. male with a history of recurrent low-grade TA urothelial carcinoma of the bladder.  He was found to have a papillary bladder tumor recurrence on recent surveillance cystoscopy.  He has been consented for the above procedures, voices understanding and wishes to proceed.  Description of procedure:  After informed consent was obtained, the patient was brought to the operating room and general LMA anesthesia was administered. The patient was then placed in the dorsolithotomy position and prepped and draped in the usual sterile fashion. A timeout was performed. A 23 French rigid cystoscope was then inserted into the urethral meatus and advanced into the bladder under direct vision. A complete bladder survey revealed the findings listed above.  The rigid cystoscope was then exchanged for a 26 French resectoscope with a bipolar loop working element.  The 5 mm papillary bladder tumor involving the posterior bladder wall was then excised down to the detrusor musculature.  There is no evidence of bladder perforation.  The specimen was then evacuated from the lumen of the bladder through the sheath of the resectoscope and sent for permanent section.  The area of resection was then extensively fulgurated  until hemostasis was achieved.  An 76 French Foley catheter was then inserted and placed to gravity drainage.  He tolerated the procedure well and was transferred to the recovery room in stable condition.  While in the recovery room 2000 mg of gemcitabine in 50 mL of water was instilled in the bladder through the catheter and the catheter was plugged. This will remain indwelling for approximately one hour. It will then be drained from the bladder and the catheter will be removed and the patient discharged home.  Plan: Keep gemcitabine intravesically for 1 hour and then drain bladder and remove Foley catheter.  Discharge home.

## 2022-10-15 NOTE — Transfer of Care (Signed)
Immediate Anesthesia Transfer of Care Note  Patient: Darius [name]  Procedure(s) Performed: TRANSURETHRAL RESECTION OF BLADDER TUMOR (TURBT) with GEMCITABINE (Bladder) CYSTOSCOPY (Bladder)  Patient Location: PACU  Anesthesia Type:General  Level of Consciousness: drowsy  Airway & Oxygen Therapy: Patient Spontanous Breathing and Patient connected to nasal cannula oxygen  Post-op Assessment: Report given to RN and Post -op Vital signs reviewed and stable  Post vital signs: Reviewed and stable  Last Vitals:  Vitals Value Taken Time  BP 109/70 10/15/22 1013  Temp 36.6 C 10/15/22 1013  Pulse 52 10/15/22 1013  Resp 18 10/15/22 1013  SpO2 91 % 10/15/22 1013  Vitals shown include unfiled device data.  Last Pain:  Vitals:   10/15/22 0755  TempSrc: Oral  PainSc: 0-No pain      Patients Stated Pain Goal: 5 (10/15/22 0755)  Complications: No notable events documented.

## 2022-10-15 NOTE — Discharge Instructions (Signed)
  Post Anesthesia Home Care Instructions  Activity: Get plenty of rest for the remainder of the day. A responsible individual must stay with you for 24 hours following the procedure.  For the next 24 hours, DO NOT: -Drive a car -Advertising copywriter -Drink alcoholic beverages -Take any medication unless instructed by your physician -Make any legal decisions or sign important papers.  Meals: Start with liquid foods such as gelatin or soup. Progress to regular foods as tolerated. Avoid greasy, spicy, heavy foods. If nausea and/or vomiting occur, drink only clear liquids until the nausea and/or vomiting subsides. Call your physician if vomiting continues.  Special Instructions/Symptoms: Your throat may feel dry or sore from the anesthesia or the breathing tube placed in your throat during surgery. If this causes discomfort, gargle with warm salt water. The discomfort should disappear within 24 hours.  May take Tylenol beginning at 2 PM as needed for soreness/discomfort.

## 2022-10-15 NOTE — Anesthesia Procedure Notes (Signed)
Procedure Name: LMA Insertion Date/Time: 10/15/2022 9:36 AM  Performed by: Briant Sites, CRNAPre-anesthesia Checklist: Patient identified, Emergency Drugs available, Suction available and Patient being monitored Patient Re-evaluated:Patient Re-evaluated prior to induction Oxygen Delivery Method: Circle system utilized Preoxygenation: Pre-oxygenation with 100% oxygen Induction Type: IV induction Ventilation: Mask ventilation without difficulty LMA: LMA inserted LMA Size: 4.0 Number of attempts: 1 Airway Equipment and Method: Bite block Placement Confirmation: positive ETCO2 Tube secured with: Tape Dental Injury: Teeth and Oropharynx as per pre-operative assessment

## 2022-10-16 ENCOUNTER — Encounter (HOSPITAL_BASED_OUTPATIENT_CLINIC_OR_DEPARTMENT_OTHER): Payer: Self-pay | Admitting: Urology

## 2022-10-16 LAB — SURGICAL PATHOLOGY

## 2022-10-27 ENCOUNTER — Other Ambulatory Visit (HOSPITAL_COMMUNITY): Payer: Self-pay

## 2023-01-04 ENCOUNTER — Other Ambulatory Visit: Payer: Self-pay

## 2023-01-05 ENCOUNTER — Other Ambulatory Visit (HOSPITAL_COMMUNITY): Payer: Self-pay

## 2023-01-09 ENCOUNTER — Other Ambulatory Visit (HOSPITAL_COMMUNITY): Payer: Self-pay

## 2023-01-09 ENCOUNTER — Other Ambulatory Visit: Payer: Self-pay | Admitting: Student

## 2023-01-12 ENCOUNTER — Other Ambulatory Visit (HOSPITAL_COMMUNITY): Payer: Self-pay

## 2023-01-12 MED ORDER — METOPROLOL TARTRATE 25 MG PO TABS
25.0000 mg | ORAL_TABLET | Freq: Two times a day (BID) | ORAL | 11 refills | Status: DC
Start: 2023-01-12 — End: 2023-01-16
  Filled 2023-01-12: qty 180, 90d supply, fill #0

## 2023-01-16 ENCOUNTER — Ambulatory Visit (INDEPENDENT_AMBULATORY_CARE_PROVIDER_SITE_OTHER): Payer: Commercial Managed Care - HMO | Admitting: Student

## 2023-01-16 ENCOUNTER — Other Ambulatory Visit (HOSPITAL_COMMUNITY): Payer: Self-pay

## 2023-01-16 VITALS — BP 136/65 | HR 50 | Temp 97.5°F | Ht 69.0 in | Wt 248.3 lb

## 2023-01-16 DIAGNOSIS — I25118 Atherosclerotic heart disease of native coronary artery with other forms of angina pectoris: Secondary | ICD-10-CM | POA: Diagnosis not present

## 2023-01-16 DIAGNOSIS — C672 Malignant neoplasm of lateral wall of bladder: Secondary | ICD-10-CM | POA: Diagnosis not present

## 2023-01-16 DIAGNOSIS — F17211 Nicotine dependence, cigarettes, in remission: Secondary | ICD-10-CM

## 2023-01-16 DIAGNOSIS — I10 Essential (primary) hypertension: Secondary | ICD-10-CM

## 2023-01-16 DIAGNOSIS — I7121 Aneurysm of the ascending aorta, without rupture: Secondary | ICD-10-CM | POA: Diagnosis not present

## 2023-01-16 MED ORDER — METOPROLOL TARTRATE 25 MG PO TABS
25.0000 mg | ORAL_TABLET | Freq: Two times a day (BID) | ORAL | 11 refills | Status: DC
  Filled 2023-01-16: qty 60, 30d supply, fill #0
  Filled 2023-04-08: qty 180, 90d supply, fill #0
  Filled 2023-06-30: qty 60, 30d supply, fill #1
  Filled 2023-08-08: qty 60, 30d supply, fill #2
  Filled 2023-09-08: qty 60, 30d supply, fill #3
  Filled 2023-10-08: qty 60, 30d supply, fill #4
  Filled 2023-11-10: qty 60, 30d supply, fill #5
  Filled 2023-12-04: qty 60, 30d supply, fill #6
  Filled 2024-01-10: qty 60, 30d supply, fill #7

## 2023-01-16 NOTE — Assessment & Plan Note (Signed)
 Patient with a history of low-grade TA urothelial carcinoma the bladder that was initially diagnosed in 2019 following resection of a greater than 2 cm mass. He had a subsequent UCC recurrence in 2022 and 01/2022 with the same pathology.  Patient had a surveillance cystoscopy on 09/25/2022 that showed 5 mm papillary bladder tumor recurrence adjacent to his previous resection site along the right lateral sidewall.  He is status post cystoscopy with TURBT with gemcitabine  instillation 10/15/2022.  Plan: -Patient is advised to continue following up with his urologist

## 2023-01-16 NOTE — Progress Notes (Signed)
 Established Patient Office Visit  Subjective   Patient ID: Darius [name], male    DOB: Jul 23, 1955  Age: 68 y.o. MRN: [id number]  Chief Complaint  Patient presents with   Follow-up   Medication Refill    HPI This is 68 year old male living with a history stated below and presents today for med refill. Please see problem based assessment and plan for additional details.    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) 12/09/2017   CAD (coronary artery disease), native coronary artery 11/30/2017   Behcet's disease (HCC) 08/21/2015   HTN (hypertension) 08/06/2015   Past Medical History:  Diagnosis Date   AAA (abdominal aortic aneurysm) (HCC)    followed by cardiologist--- last CTA 11-14-2021,  40mm;  echo 04-29-2022   42 mm   Behcet's disease (HCC)    per cardiologist note   Bladder cancer [facility])    urologist--- dr winter---- first dx 12/ 2019 s/p turbt   CAD (coronary artery disease) 2019   cardiologist--- dr santo---- hx inferior STEMI  11-30-2017  s/p  PCI with DES to proxRCA;  normal echo 11-30-2017 in epic;   last NUC 05-06-2022  IR due ischemia but , no ST deviation and  no evidence infarct w/ medium defect w/ normal wall motion, nuclear ef 55%   DOE (dyspnea on exertion)    per pt a little sob w/ stairs but recovers quickly, stated this is usual   Emphysema/COPD [facility])    History of ST elevation myocardial infarction (STEMI) 11/30/2017   s/p  pci w/ des   Hypertension    followed by pcp   Nocturia    S/P drug eluting coronary stent placement 11/30/2017   DES x1 to proxRCA   Past Surgical History:  Procedure Laterality Date    CATARACT EXTRACTION W/ INTRAOCULAR LENS IMPLANT Right 07/2022   CORONARY/GRAFT ACUTE MI REVASCULARIZATION N/A 11/30/2017   Procedure: Coronary/Graft Acute MI Revascularization;  Surgeon: Claudene Victory ORN, MD;  Location: [facility] INVASIVE CV LAB;  Service: Cardiovascular;  Laterality: N/A;   CYSTOSCOPY N/A 10/15/2022   Procedure: CYSTOSCOPY;  Surgeon: Devere Lonni Righter, MD;  Location: [facility];  Service: Urology;  Laterality: N/A;   LEFT HEART CATH AND CORONARY ANGIOGRAPHY N/A 11/30/2017   Procedure: LEFT HEART CATH AND CORONARY ANGIOGRAPHY;  Surgeon: Claudene Victory ORN, MD;  Location: [facility] INVASIVE CV LAB;  Service: Cardiovascular;  Laterality: N/A;   TRANSURETHRAL RESECTION OF BLADDER TUMOR N/A 12/16/2017   Procedure: TRANSURETHRAL RESECTION OF BLADDER TUMOR (TURBT);  Surgeon: Matilda Senior, MD;  Location: [facility] OR[facility];  Service: Urology;  Laterality: N/A;  90 MINS   TRANSURETHRAL RESECTION OF BLADDER TUMOR WITH MITOMYCIN -C N/A 03/29/2020   Procedure: TRANSURETHRAL RESECTION OF BLADDER TUMOR WITH POST OPERATIVE INSTILLATION OF GEMCITABINE ;  Surgeon: Matilda Senior, MD;  Location: [facility];  Service: Urology;  Laterality: N/A;  30 MINS   TRANSURETHRAL RESECTION OF BLADDER TUMOR WITH MITOMYCIN -C N/A 12/17/2020   Procedure: TRANSURETHRAL RESECTION OF BLADDER TUMOR WITH GEMCITABINE  IN PACU;  Surgeon: Matilda Senior, MD;  Location: [facility];  Service: Urology;  Laterality: N/A;   TRANSURETHRAL RESECTION OF BLADDER TUMOR WITH  MITOMYCIN -C N/A 01/20/2022   Procedure: TRANSURETHRAL RESECTION OF BLADDER TUMOR WITH GEMCITABINE ;  Surgeon: Matilda Senior, MD;  Location: [facility];  Service: Urology;  Laterality: N/A;  30 MINS   Social History   Tobacco Use   Smoking status: Former    Current packs/day: 1.25    Average packs/day: 0.5 packs/day for 55.0 years (27.7 ttl pk-yrs)    Types: Cigarettes    Start date: 1970   Smokeless  tobacco: Never   Tobacco comments:    10-08-2022  per pt quit smoking approx 09-24-2022,  started smoking age 45, smoked 1.25 ppd  Vaping Use   Vaping status: Never Used  Substance Use Topics   Alcohol use: No   Drug use: Never   Social History   Socioeconomic History   Marital status: Married    Spouse name: Not on file   Number of children: Not on file   Years of education: Not on file   Highest education level: Not on file  Occupational History   Not on file  Tobacco Use   Smoking status: Former    Current packs/day: 1.25    Average packs/day: 0.5 packs/day for 55.0 years (27.7 ttl pk-yrs)    Types: Cigarettes    Start date: 1970   Smokeless tobacco: Never   Tobacco comments:    10-08-2022  per pt quit smoking approx 09-24-2022,  started smoking age 67, smoked 1.25 ppd  Vaping Use   Vaping status: Never Used  Substance and Sexual Activity   Alcohol use: No   Drug use: Never   Sexual activity: Not on file  Other Topics Concern   Not on file  Social History Narrative   Not on file   Social Drivers of Health   Financial Resource Strain: Not on file  Food Insecurity: Not on file  Transportation Needs: Not on file  Physical Activity: Not on file  Stress: Not on file  Social Connections: Not on file  Intimate Partner Violence: Not on file   Family Status  Relation Name Status   Mother  Alive   Father  Deceased   MGM  Deceased   MGF  Deceased   PGM  Deceased   PGF  Deceased  No partnership data on file   No family history on file. No Known Allergies  ROS   All negative except for assessment and plan Objective:     BP 136/65 (BP Location: Right Arm, Patient Position: Sitting, Cuff Size: Small)   Pulse (!) 50   Temp (!) 97.5 F (36.4 C) (Oral)   Ht 5' 9 (1.753 m)   Wt 248 lb 4.8 oz (112.6 kg)   BMI 36.67 kg/m  BP Readings from Last 3 Encounters:  01/16/23 136/65  10/15/22 126/84  10/03/22 122/60   Wt Readings from Last 3 Encounters:   01/16/23 248 lb 4.8 oz (112.6 kg)  10/15/22 243 lb 3.2 oz (110.3 kg)  10/03/22 242 lb 12.8 oz (110.1 kg)   SpO2 Readings from Last 3 Encounters:  10/15/22 92%  10/03/22 93%  06/23/22 95%      Physical Exam  General: Sitting in chair, no acute distress Cardiovascular: Regular rate, no lower extremity edema bilaterally Pulmonary: CTA bilaterally, no wheezing or crackles Abdomen: Soft, nontender, nondistended, bowel sounds present MSK: Range of motion intact  No results found for any visits on 01/16/23.  Last CBC Lab Results  Component Value Date   WBC 8.8 07/18/2020   HGB 15.3 10/15/2022  HCT 45.0 10/15/2022   MCV 92 07/18/2020   MCH 30.5 07/18/2020   RDW 13.1 07/18/2020   PLT 446 07/18/2020   Last metabolic panel Lab Results  Component Value Date   GLUCOSE 148 (H) 10/15/2022   NA 140 10/15/2022   K 4.3 10/15/2022   CL 106 10/15/2022   CO2 22 10/31/2020   BUN 10 10/15/2022   CREATININE 0.70 10/15/2022   EGFR 102 10/31/2020   CALCIUM  9.4 10/31/2020   PROT 7.2 10/31/2020   ALBUMIN 4.3 10/31/2020   LABGLOB 2.9 02/12/2018   AGRATIO 1.5 02/12/2018   BILITOT 0.8 10/31/2020   ALKPHOS 87 10/31/2020   AST 15 10/31/2020   ALT 17 10/31/2020   ANIONGAP 11 09/29/2018   Last lipids Lab Results  Component Value Date   CHOL 103 10/31/2020   HDL 33 (L) 10/31/2020   LDLCALC 53 10/31/2020   TRIG 87 10/31/2020   CHOLHDL 3.1 10/31/2020   Last hemoglobin A1c Lab Results  Component Value Date   HGBA1C 6.5 (H) 11/30/2017   Last thyroid functions Lab Results  Component Value Date   TSH 1.044 11/30/2017    The ASCVD Risk score (Arnett DK, et al., 2019) failed to calculate for the following reasons:   Risk score cannot be calculated because patient has a medical history suggesting prior/existing ASCVD    Assessment & Plan:  Patient is discussed with Dr. Forest  Problem List Items Addressed This Visit       Cardiovascular and Mediastinum   CAD (coronary  artery disease), native coronary artery - Primary (Chronic)   Patient with previous history of inferior STEMI with RCA stent in 2019 and residual LAD disease.  His current medications include atorvastatin  80 mg daily, Imdur  60 mg daily, metoprolol  25 mg twice daily, as needed nitroglycerin .  Patient denies any acute chest pains or shortness of breath at this time.  Patient follows with his cardiology, no changes to medications noted at this time.   Plan: -Continue Atorvastatin  80mg  daily, Isosorbide  Mononitrate 60mg  daily, Losartan  25mg  daily, Metoprolol  25mg  BID, and PRN Nitroglycerin . -Continue following with cardiology      Relevant Medications   metoprolol  tartrate (LOPRESSOR ) 25 MG tablet   HTN (hypertension)   BP Readings from Last 3 Encounters:  01/16/23 136/65  10/15/22 126/84  10/03/22 122/60   Patient's blood pressure today is 136/65, stable. Patient denies any dizziness or lightheadedness with medications.  Refills are sent to his pharmacy. patient is a medications as prescribed.  Plan: -Continue taking losartan  25 mg daily, metoprolol  25 mg twice daily.      Relevant Medications   metoprolol  tartrate (LOPRESSOR ) 25 MG tablet   Thoracic aortic aneurysm (HCC)   History of asymptomatic thoracic aortic aneurysm. Found to have increase in size from 43mm to 47 mm on echo on 05/03/2020. CTA on 11/02/2020 measuring 42mm. ECHO on 04/19/ 2024 showed ascending aorta (42 mm) which has not increased and size.  Patient has a repeat imaging on 04/24/2023.  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 He had a subsequent UCC  recurrence in 2022 and 01/2022 with the  same pathology.  Patient had a surveillance cystoscopy on 09/25/2022 that showed 5 mm papillary bladder tumor recurrence adjacent to his previous resection site along the right lateral sidewall.  He is status post cystoscopy with TURBT with gemcitabine  instillation 10/15/2022.  Plan: -Patient is advised to continue following up with his urologist       Return in about 6 months (around 07/16/2023) for 4-6 months routine .    Toma Edwards, DO

## 2023-01-16 NOTE — Assessment & Plan Note (Addendum)
 Patient with previous history of inferior STEMI with RCA stent in 2019 and residual LAD disease.  His current medications include atorvastatin  80 mg daily, Imdur  60 mg daily, metoprolol  25 mg twice daily, as needed nitroglycerin .  Patient denies any acute chest pains or shortness of breath at this time.  Patient follows with his cardiology, no changes to medications noted at this time.   Plan: -Continue Atorvastatin  80mg  daily, Isosorbide  Mononitrate 60mg  daily, Losartan  25mg  daily, Metoprolol  25mg  BID, and PRN Nitroglycerin . -Continue following with cardiology

## 2023-01-16 NOTE — Patient Instructions (Addendum)
 Thank you, Mr.Binnie M Zemanek for allowing us  to provide your care today. Today we discussed:   For your blood pressure/CAD -Continue taking atorvastatin  80 mg daily, Isosorbide  Mononitrate 60mg  daily, Losartan  25mg  daily, Metoprolol  25mg  BID, and PRN Nitroglycerin .   For your bladder -Continue following up with your urologist  For your heart -Continue following up with your heart doctor  You do have a repeat imaging on/11/2023 to check the aorta.  I have ordered the following labs for you:  Lab Orders  No laboratory test(s) ordered today     Tests ordered today:  none  Referrals ordered today:   Referral Orders  No referral(s) requested today     I have ordered the following medication/changed the following medications:   Stop the following medications: Medications Discontinued During This Encounter  Medication Reason   metoprolol  tartrate (LOPRESSOR ) 25 MG tablet Reorder     Start the following medications: Meds ordered this encounter  Medications   metoprolol  tartrate (LOPRESSOR ) 25 MG tablet    Sig: Take 1 tablet (25 mg total) by mouth 2 (two) times daily.    Dispense:  60 tablet    Refill:  11    IM program     Follow up: 4-6 months routine visit    Remember:   Should you have any questions or concerns please call the internal medicine clinic at [phone].     Toma Edwards, DO [facility]

## 2023-01-16 NOTE — Assessment & Plan Note (Signed)
 BP Readings from Last 3 Encounters:  01/16/23 136/65  10/15/22 126/84  10/03/22 122/60   Patient's blood pressure today is 136/65, stable. Patient denies any dizziness or lightheadedness with medications.  Refills are sent to his pharmacy. patient is a medications as prescribed.  Plan: -Continue taking losartan  25 mg daily, metoprolol  25 mg twice daily.

## 2023-01-16 NOTE — Assessment & Plan Note (Signed)
 History of asymptomatic thoracic aortic aneurysm. Found to have increase in size from 43mm to 47 mm on echo on 05/03/2020. CTA on 11/02/2020 measuring 42mm. ECHO on 04/19/ 2024 showed ascending aorta (42 mm) which has not increased and size.  Patient has a repeat imaging on 04/24/2023.  Plan: -CT imaging on 04/24/2023 -Otherwise continue taking the medications as prescribed per CAD

## 2023-01-19 NOTE — Progress Notes (Signed)
 Internal Medicine Clinic Attending  Case discussed with the resident at the time of the visit.  We reviewed the resident's history and exam and pertinent patient test results.  I agree with the assessment, diagnosis, and plan of care documented in the resident's note.

## 2023-01-23 ENCOUNTER — Encounter: Payer: Commercial Managed Care - HMO | Admitting: Student

## 2023-03-03 ENCOUNTER — Telehealth: Payer: Self-pay | Admitting: Internal Medicine

## 2023-03-03 ENCOUNTER — Telehealth: Payer: Self-pay | Admitting: *Deleted

## 2023-03-03 ENCOUNTER — Other Ambulatory Visit: Payer: Self-pay | Admitting: Urology

## 2023-03-03 NOTE — Telephone Encounter (Signed)
 Per Preop APP add pt on tomorrow for tele preop appt due to med hold and procedure date. Pt agreeable to tele appt 03/04/23. Med rec and consent are done.

## 2023-03-03 NOTE — Telephone Encounter (Signed)
   Name: Darius [name]  DOB: 13-Sep-1955  MRN: [id number]  Primary Cardiologist: None   Preoperative team, please contact this patient and set up a phone call appointment for further preoperative risk assessment. Please obtain consent and complete medication review. Thank you for your help.  Okay to use provider slot on Wednesday.  I confirm that guidance regarding antiplatelet and oral anticoagulation therapy has been completed and, if necessary, noted below.  Per office protocol, he may hold Plavix for 5 days prior to procedure and should resume as soon as hemodynamically stable postoperatively.   I also confirmed the patient resides in the state of [place]. As per [facility] Medical Board telemedicine laws, the patient must reside in the state in which the provider is licensed.   Carlos Levering, NP 03/03/2023, 9:57 AM [facility]

## 2023-03-03 NOTE — Telephone Encounter (Signed)
 Per Preop APP add pt on tomorrow for tele preop appt due to med hold and procedure date. Pt agreeable to tele appt 03/04/23. Med rec and consent are done.      Patient Consent for Virtual Visit        Darius [name] has provided verbal consent on 03/03/2023 for a virtual visit (video or telephone).   CONSENT FOR VIRTUAL VISIT FOR:  Darius [name]  By participating in this virtual visit I agree to the following:  I hereby voluntarily request, consent and authorize [facility] and its employed or contracted physicians, physician assistants, nurse practitioners or other licensed health care professionals (the Practitioner), to provide me with telemedicine health care services (the "Services") as deemed necessary by the treating Practitioner. I acknowledge and consent to receive the Services by the Practitioner via telemedicine. I understand that the telemedicine visit will involve communicating with the Practitioner through live audiovisual communication technology and the disclosure of certain medical information by electronic transmission. I acknowledge that I have been given the opportunity to request an in-person assessment or other available alternative prior to the telemedicine visit and am voluntarily participating in the telemedicine visit.  I understand that I have the right to withhold or withdraw my consent to the use of telemedicine in the course of my care at any time, without affecting my right to future care or treatment, and that the Practitioner or I may terminate the telemedicine visit at any time. I understand that I have the right to inspect all information obtained and/or recorded in the course of the telemedicine visit and may receive copies of available information for a reasonable fee.  I understand that some of the potential risks of receiving the Services via telemedicine include:  Delay or interruption in medical evaluation due to technological equipment failure or  disruption; Information transmitted may not be sufficient (e.g. poor resolution of images) to allow for appropriate medical decision making by the Practitioner; and/or  In rare instances, security protocols could fail, causing a breach of personal health information.  Furthermore, I acknowledge that it is my responsibility to provide information about my medical history, conditions and care that is complete and accurate to the best of my ability. I acknowledge that Practitioner's advice, recommendations, and/or decision may be based on factors not within their control, such as incomplete or inaccurate data provided by me or distortions of diagnostic images or specimens that may result from electronic transmissions. I understand that the practice of medicine is not an exact science and that Practitioner makes no warranties or guarantees regarding treatment outcomes. I acknowledge that a copy of this consent can be made available to me via my patient portal [facility] MyChart), or I can request a printed copy by calling the office of [facility].    I understand that my insurance will be billed for this visit.   I have read or had this consent read to me. I understand the contents of this consent, which adequately explains the benefits and risks of the Services being provided via telemedicine.  I have been provided ample opportunity to ask questions regarding this consent and the Services and have had my questions answered to my satisfaction. I give my informed consent for the services to be provided through the use of telemedicine in my medical care

## 2023-03-03 NOTE — Progress Notes (Unsigned)
 Virtual Visit via Telephone Note   Because of Darius [name]'s co-morbid illnesses, he is at least at moderate risk for complications without adequate follow up.  This format is felt to be most appropriate for this patient at this time.  The patient did not have access to video technology/had technical difficulties with video requiring transitioning to audio format only (telephone).  All issues noted in this document were discussed and addressed.  No physical exam could be performed with this format.  Please refer to the patient's chart for his consent to telehealth for [facility].  Evaluation Performed:  Preoperative cardiovascular risk assessment _____________   Date:  03/03/2023   Patient ID:  Darius [name], DOB [birth year], MRN [id number] Patient Location:  Home Provider location:   Office  Primary Care Provider:  Jeral Pinch, DO Primary Cardiologist:  None  Chief Complaint / Patient Profile   68 y.o. y/o male with a h/o CAD s/p PCI with DES x 2 to proximal and distal RCA November 2019, thoracic aortic aneurysm, hypertension, hyperlipidemia, urinary bladder cancer, former tobacco abuse who is pending cystoscopy with transurethral resection of bladder tumor by Dr. Liliane Shi and presents today for telephonic preoperative cardiovascular risk assessment.  History of Present Illness    Deylan Tressia [name] is a 68 y.o. male who presents via audio/video conferencing for a telehealth visit today.  Pt was last seen in cardiology clinic on 10/03/2022 by Dr. Izora Ribas.  At that time Darius [name] was stable from a cardiac standpoint.  The patient is now pending procedure as outlined above. Since his last visit, he is doing well. Patient denies shortness of breath, dyspnea on exertion, lower extremity edema, orthopnea or PND. No chest pain, pressure, or tightness. No palpitations.  He does not perform regular exercise. He is independent with ALDs and able to perform light to moderate  household activities as well as yard work.   Past Medical History    Past Medical History:  Diagnosis Date   AAA (abdominal aortic aneurysm) (HCC)    followed by cardiologist--- last CTA 11-14-2021,  40mm;  echo 04-29-2022   42 mm   Behcet's disease (HCC)    per cardiologist note   Bladder cancer [facility])    urologist--- dr winter---- first dx 12/ 2019 s/p turbt   CAD (coronary artery disease) 2019   cardiologist--- dr Izora Ribas---- hx inferior STEMI  11-30-2017  s/p  PCI with DES to proxRCA;  normal echo 11-30-2017 in epic;   last NUC 05-06-2022  IR due ischemia but , no ST deviation and  no evidence infarct w/ medium defect w/ normal wall motion, nuclear ef 55%   DOE (dyspnea on exertion)    per pt a little sob w/ stairs but recovers quickly, stated this is usual   Emphysema/COPD [facility])    History of ST elevation myocardial infarction (STEMI) 11/30/2017   s/p  pci w/ des   Hypertension    followed by pcp   Nocturia    S/P drug eluting coronary stent placement 11/30/2017   DES x1 to proxRCA   Past Surgical History:  Procedure Laterality Date   CATARACT EXTRACTION W/ INTRAOCULAR LENS IMPLANT Right 07/2022   CORONARY/GRAFT ACUTE MI REVASCULARIZATION N/A 11/30/2017   Procedure: Coronary/Graft Acute MI Revascularization;  Surgeon: Lyn Records, MD;  Location: [facility] INVASIVE CV LAB;  Service: Cardiovascular;  Laterality: N/A;   CYSTOSCOPY N/A 10/15/2022   Procedure: CYSTOSCOPY;  Surgeon: Rene Paci, MD;  Location: [place]  [facility];  Service: Urology;  Laterality: N/A;   LEFT HEART CATH AND CORONARY ANGIOGRAPHY N/A 11/30/2017   Procedure: LEFT HEART CATH AND CORONARY ANGIOGRAPHY;  Surgeon: Lyn Records, MD;  [facility]: [facility] INV[facility] CV LAB;  Service: Cardiovascular;  Laterality: N/A;   TRANSURETHRAL RESECTION OF BLADDER TUMOR N/A 12/16/2017   Procedure: TRANSURETHRAL RESECTION OF BLADDER TUMOR (TURBT);  Surgeon: Marcine Matar, MD;  Location[facility];  Service:  Urology;  Laterality: N/A;  90 MINS   TRANSURETHRAL RESECTION OF BLADDER TUMOR WITH MITOMYCIN-C N/A 03/29/2020   Procedure: TRANSURETHRAL RESECTION OF BLADDER TUMOR WITH POST OPERATIVE INSTILLATION OF GEMCITABINE;  Surgeon: Marcine Matar, MD;  Location: [facility];  Service: Urology;  Laterality: N/A;  30 MINS   TRANSURETHRAL RESECTION OF BLADDER TUMOR WITH MITOMYCIN-C N/A 12/17/2020   Procedure: TRANSURETHRAL RESECTION OF BLADDER TUMOR WITH GEMCITABINE IN PACU;  Surgeon: Marcine Matar, MD;  Location: [facility];  Service: Urology;  Laterality: N/A;   TRANSURETHRAL RESECTION OF BLADDER TUMOR WITH MITOMYCIN-C N/A 01/20/2022   Procedure: TRANSURETHRAL RESECTION OF BLADDER TUMOR WITH GEMCITABINE;  Surgeon: Marcine Matar, MD;  Location: [facility];  Service: Urology;  Laterality: N/A;  30 MINS    Allergies  No Known Allergies  Home Medications    Prior to Admission medications   Medication Sig Start Date End Date Taking? Authorizing Provider  atorvastatin (LIPITOR) 80 MG tablet Take 1 tablet (80 mg total) by mouth daily at 6 PM. Patient taking differently: Take 80 mg by mouth every evening. 10/03/22   Chandrasekhar, Mahesh A, MD  isosorbide mononitrate (IMDUR) 60 MG 24 hr tablet Take 1 tablet (60 mg total) by mouth daily. Patient taking differently: Take 60 mg by mouth daily. 10/03/22   Christell Constant, MD  losartan (COZAAR) 25 MG tablet Take 1 tablet (25 mg total) by mouth daily. 10/03/22   Christell Constant, MD  meclizine (ANTIVERT) 25 MG tablet Take 1 tablet (25 mg total) by mouth 3 (three) times daily as needed for dizziness. 09/24/20   Rehman, Areeg N, DO  metoprolol tartrate (LOPRESSOR) 25 MG tablet Take 1 tablet (25 mg total) by mouth 2 (two) times daily. 01/16/23 01/16/24  Jeral Pinch, DO  Multiple Vitamin (MULTIVITAMIN WITH MINERALS) TABS tablet Take 1 tablet by mouth daily. Gummy vitamins    [provider]  nitroGLYCERIN (NITROSTAT) 0.4 MG SL tablet Place 1 tablet (0.4 mg total) under the tongue every 5 (five) minutes as needed for chest pain. 10/25/21   Adron Bene, MD  oxybutynin (DITROPAN) 5 MG tablet Take 1 tablet (5 mg total) by mouth every 8 (eight) hours as needed for bladder spasms. 10/15/22   Rene Paci, MD  phenazopyridine (PYRIDIUM) 200 MG tablet Take 1 tablet (200 mg total) by mouth 3 (three) times daily as needed (for pain with urination). 10/15/22 10/15/23  Rene Paci, MD  varenicline (CHANTIX) 1 MG tablet Take 1 tablet (1 mg total) by mouth 2 (two) times daily. Patient taking differently: Take 1 mg by mouth daily. 03/17/22   Gaston Islam., NP    Physical Exam    Vital Signs:  Ashford Tressia [name] does not have vital signs available for review today.  Given telephonic nature of communication, physical exam is limited. AAOx3. NAD. Normal affect.  Speech and respirations are unlabored.   Assessment & Plan    Primary Cardiologist: None  Preoperative cardiovascular risk assessment.  Cystoscopy with transurethral resection of bladder tumor by  Dr. Liliane Shi on 03/11/2023.  Chart reviewed as part of pre-operative protocol coverage. According to the RCRI, patient has a 0.9% risk of MACE. Patient reports activity equivalent to 5.62 METS (per DASI).   Given past medical history and time since last visit, based on ACC/AHA guidelines, Duilio M Scibilia would be at acceptable risk for the planned procedure without further cardiovascular testing.   Patient was advised that if he develops new symptoms prior to surgery to contact our office to arrange a follow-up appointment.  he verbalized understanding.  Patient is not on Plavix and he stopped aspirin prior to his last surgical procedure.  Discussed resuming aspirin as soon as hemodynamically stable postoperatively.    I will route this recommendation to the requesting party via Epic fax function.  Please call  with questions.  Time:   Today, I have spent 6 minutes with the patient with telehealth technology discussing medical history, symptoms, and management plan.     Carlos Levering, NP  03/03/2023, 3:30 PM

## 2023-03-03 NOTE — Telephone Encounter (Signed)
   Pre-operative Risk Assessment    Patient Name: Darius [name]  DOB: [birth year] MRN: [id number]   Date of last office visit:  Date of next office visit:    Request for Surgical Clearance    Procedure:  Cystoscopy with Transurethral Resection of bladder tumor  Date of Surgery:  2-26--25                             Surgeon:  Dr Thayer Ohm Liliane Shi Surgeon's Group or Practice Name:   Phone number:  [phone] Fax number:  [phone]   Type of Clearance Requested:   - Medical  - Pharmacy:  Hold Clopidogrel (Plavix) can patient hold for 5 days prior   Type of Anesthesia:  General    Additional requests/questions:    Rivka Safer   03/03/2023, 9:23 AM

## 2023-03-04 ENCOUNTER — Ambulatory Visit: Payer: Commercial Managed Care - HMO | Attending: Internal Medicine | Admitting: Student

## 2023-03-04 DIAGNOSIS — Z01810 Encounter for preprocedural cardiovascular examination: Secondary | ICD-10-CM | POA: Diagnosis not present

## 2023-03-09 NOTE — Progress Notes (Addendum)
 PCP - Jeral Pinch, DO LOV 01-16-23 epic Cardiologist - Tele clearance 03-04-23 epic Carlos Levering, NP  epic  PPM/ICD -  Device Orders -  Rep Notified -   Chest x-ray - CT chest 11-02-20 EKG -  Stress Test - 05-06-22 epic ECHO - 05-02-22 epic Cardiac Cath -   Sleep Study -  CPAP -   Fasting Blood Sugar -  Checks Blood Sugar _____ times a day  Blood Thinner Instructions: Aspirin Instructions:  ERAS Protcol - PRE-SURGERY    COVID vaccine -yes  Activity--Able to climb a flight of stairs without CP or SOB Anesthesia review: 42mm ascending thoracic aortic aneurysm, CAD, STENT, STEMI 2019, Behcet's disease, HTN, pt. Reported at preop he had an inflamed ulcer on his testicle. Pt. Advised  to call surgeons office. Pt. VU. I also sent a secure chat regarding this information. Spoke with Christianne Dolin about pt. Ulcer . She said she would let Dr. Liliane Shi know about the situation.  Patient denies shortness of breath, fever, cough and chest pain at PAT appointment   All instructions explained to the patient, with a verbal understanding of the material. Patient agrees to go over the instructions while at home for a better understanding. Patient also instructed to self quarantine after being tested for COVID-19. The opportunity to ask questions was provided.

## 2023-03-09 NOTE — Patient Instructions (Signed)
 SURGICAL WAITING ROOM VISITATION  Patients having surgery or a procedure may have no more than 2 support people in the waiting area - these visitors may rotate.    Children under the age of 71 must have an adult with them who is not the patient.  Due to an increase in RSV and influenza rates and associated hospitalizations, children ages 27 and under may not visit patients in [facility] hospitals.  Visitors with respiratory illnesses are discouraged from visiting and should remain at home.  If the patient needs to stay at the hospital during part of their recovery, the visitor guidelines for inpatient rooms apply. Pre-op nurse will coordinate an appropriate time for 1 support person to accompany patient in pre-op.  This support person may not rotate.    Please refer to the [facility] website for the visitor guidelines for Inpatients (after your surgery is over and you are in a regular room).       Your procedure is scheduled on: 03-11-23   Report to [facility] Main Entrance    Report to admitting at     0745 b AM   Call this number if you have problems the morning of surgery [phone]   Do not eat food or drink liquids :After Midnight. Except sips of water with meds              If you have questions, please contact your surgeon's office.   FOLLOW AND ANY ADDITIONAL PRE OP INSTRUCTIONS YOU RECEIVED FROM YOUR SURGEON'S OFFICE!!!     Oral Hygiene is also important to reduce your risk of infection.                                    Remember - BRUSH YOUR TEETH THE MORNING OF SURGERY WITH YOUR REGULAR TOOTHPASTE  DENTURES WILL BE REMOVED PRIOR TO SURGERY PLEASE DO NOT APPLY "Poly grip" OR ADHESIVES!!!   Do NOT smoke after Midnight   Stop all vitamins and herbal supplements 7 days before surgery.   Take these medicines the morning of surgery with A SIP OF WATER: metoprolol, Imdur    Bring CPAP mask and tubing day of surgery.                              You  may not have any metal on your body including hair pins, jewelry, and body piercing             Do not wear  lotions, powders, perfumes/cologne, or deodorant               Men may shave face and neck.   Do not bring valuables to the hospital. [facility] IS NOT             RESPONSIBLE   FOR VALUABLES.   Contacts, glasses, dentures or bridgework may not be worn into surgery.   Bring small overnight bag day of surgery.   DO NOT BRING YOUR HOME MEDICATIONS TO THE HOSPITAL. PHARMACY WILL DISPENSE MEDICATIONS LISTED ON YOUR MEDICATION LIST TO YOU DURING YOUR ADMISSION IN THE HOSPITAL!    Patients discharged on the day of surgery will not be allowed to drive home.  Someone NEEDS to stay with you for the first 24 hours after anesthesia.   Special Instructions: Bring a copy of your healthcare power of attorney and  living will documents the day of surgery if you haven't scanned them before.              Please read over the following fact sheets you were given: IF YOU HAVE QUESTIONS ABOUT YOUR PRE-OP INSTRUCTIONS PLEASE CALL [phone]    If you test positive for Covid or have been in contact with anyone that has tested positive in the last 10 days please notify you surgeon.    [facility] - Preparing for Surgery Before surgery, you can play an important role.  Because skin is not sterile, your skin needs to be as free of germs as possible.  You can reduce the number of germs on your skin by washing with CHG (chlorahexidine gluconate) soap before surgery.  CHG is an antiseptic cleaner which kills germs and bonds with the skin to continue killing germs even after washing. Please DO NOT use if you have an allergy to CHG or antibacterial soaps.  If your skin becomes reddened/irritated stop using the CHG and inform your nurse when you arrive at Short Stay. Do not shave (including legs and underarms) for at least 48 hours prior to the first CHG shower.  You may shave your face/neck. Please follow  these instructions carefully:  1.  Shower with CHG Soap the night before surgery and the  morning of Surgery.  2.  If you choose to wash your hair, wash your hair first as usual with your  normal  shampoo.  3.  After you shampoo, rinse your hair and body thoroughly to remove the  shampoo.                           4.  Use CHG as you would any other liquid soap.  You can apply chg directly  to the skin and wash                       Gently with a scrungie or clean washcloth.  5.  Apply the CHG Soap to your body ONLY FROM THE NECK DOWN.   Do not use on face/ open                           Wound or open sores. Avoid contact with eyes, ears mouth and genitals (private parts).                       Wash face,  Genitals (private parts) with your normal soap.             6.  Wash thoroughly, paying special attention to the area where your surgery  will be performed.  7.  Thoroughly rinse your body with warm water from the neck down.  8.  DO NOT shower/wash with your normal soap after using and rinsing off  the CHG Soap.                9.  Pat yourself dry with a clean towel.            10.  Wear clean pajamas.            11.  Place clean sheets on your bed the night of your first shower and do not  sleep with pets. Day of Surgery : Do not apply any lotions/deodorants the morning of surgery.  Please wear clean clothes to the hospital/surgery center.  FAILURE TO FOLLOW THESE INSTRUCTIONS MAY RESULT IN THE CANCELLATION OF YOUR SURGERY PATIENT SIGNATURE_________________________________  NURSE SIGNATURE__________________________________  ________________________________________________________________________

## 2023-03-10 ENCOUNTER — Other Ambulatory Visit: Payer: Self-pay

## 2023-03-10 ENCOUNTER — Encounter (HOSPITAL_COMMUNITY)
Admission: RE | Admit: 2023-03-10 | Discharge: 2023-03-10 | Disposition: A | Discharge status: Home / Self Care | Payer: Commercial Managed Care - HMO | Source: Ambulatory Visit | Attending: Urology | Admitting: Urology

## 2023-03-10 ENCOUNTER — Encounter (HOSPITAL_COMMUNITY): Payer: Self-pay

## 2023-03-10 ENCOUNTER — Other Ambulatory Visit (HOSPITAL_COMMUNITY): Payer: Self-pay

## 2023-03-10 VITALS — BP 141/75 | HR 58 | Temp 98.7°F | Resp 16 | Ht 69.0 in | Wt 239.0 lb

## 2023-03-10 DIAGNOSIS — I2510 Atherosclerotic heart disease of native coronary artery without angina pectoris: Secondary | ICD-10-CM | POA: Insufficient documentation

## 2023-03-10 DIAGNOSIS — I252 Old myocardial infarction: Secondary | ICD-10-CM | POA: Insufficient documentation

## 2023-03-10 DIAGNOSIS — I444 Left anterior fascicular block: Secondary | ICD-10-CM | POA: Diagnosis not present

## 2023-03-10 DIAGNOSIS — J439 Emphysema, unspecified: Secondary | ICD-10-CM | POA: Diagnosis not present

## 2023-03-10 DIAGNOSIS — Z01818 Encounter for other preprocedural examination: Secondary | ICD-10-CM | POA: Diagnosis present

## 2023-03-10 DIAGNOSIS — C679 Malignant neoplasm of bladder, unspecified: Secondary | ICD-10-CM | POA: Diagnosis not present

## 2023-03-10 DIAGNOSIS — Z955 Presence of coronary angioplasty implant and graft: Secondary | ICD-10-CM | POA: Diagnosis not present

## 2023-03-10 DIAGNOSIS — I10 Essential (primary) hypertension: Secondary | ICD-10-CM

## 2023-03-10 DIAGNOSIS — Z87891 Personal history of nicotine dependence: Secondary | ICD-10-CM | POA: Diagnosis not present

## 2023-03-10 LAB — BASIC METABOLIC PANEL
Anion gap: 9 (ref 5–15)
BUN: 12 mg/dL (ref 8–23)
CO2: 25 mmol/L (ref 22–32)
Calcium: 9.3 mg/dL (ref 8.9–10.3)
Chloride: 101 mmol/L (ref 98–111)
Creatinine, Ser: 0.75 mg/dL (ref 0.61–1.24)
GFR, Estimated: >60 mL/min (ref >60)
Glucose, Bld: 216 mg/dL — ABNORMAL HIGH (ref 70–99)
Potassium: 5.1 mmol/L (ref 3.5–5.1)
Sodium: 135 mmol/L (ref 135–145)

## 2023-03-10 LAB — CBC
HCT: 44.5 % (ref 39.0–52.0)
Hemoglobin: 14.2 g/dL (ref 13.0–17.0)
MCH: 30.5 pg (ref 26.0–34.0)
MCHC: 31.9 g/dL (ref 30.0–36.0)
MCV: 95.5 fL (ref 80.0–100.0)
Platelets: 261 10*3/uL (ref 150–400)
RBC: 4.66 MIL/uL (ref 4.22–5.81)
RDW: 13.3 % (ref 11.5–15.5)
WBC: 10.4 10*3/uL (ref 4.0–10.5)
nRBC: 0 % (ref 0.0–0.2)

## 2023-03-10 MED ORDER — CEPHALEXIN 500 MG PO CAPS
500.0000 mg | ORAL_CAPSULE | Freq: Two times a day (BID) | ORAL | 0 refills | Status: DC
Start: 2023-03-10 — End: 2023-11-06
  Filled 2023-03-10 – 2023-03-11 (×2): qty 14, 7d supply, fill #0

## 2023-03-10 NOTE — Progress Notes (Signed)
 Case: [id number] Date/Time: 03/11/23 0945   Procedures:      CYSTOSCOPY - 30 MINUTES NEEDED     TRANSURETHRAL RESECTION OF BLADDER TUMOR (TURBT)   Anesthesia type: General   Pre-op diagnosis: BLADDER CANCER   [facility]   Surgeons: Rene Paci, MD       DISCUSSION: Darius [name] is a 68 yo male who presents to PAT prior to surgery above. PMH of former smoking (quit 2024), HTN, hx of STEMI and CAD s/p PCI (2019), TAA (4.0cm by CT in 2023), COPD, chronic DOE, Behcet's disease, bladder cancer s/p multiple TURBTs (last 10/15/2022).  Patient follows with Cardiology for above hx. Patient had a stress test in 04/2022 which showed ischemia and was intermediate risk. Imdur was added. Echo was also obtained and showed normal EF with no wall motion abnormality. He was last seen in clinic on 10/03/22 for routine f/u and pre op eval for TURBT in 10/2022. He was cleared for surgery at that time and there were no complications.  He has a f/u CTA scheduled to evaluate his TAA on 04/24/23. Last CTA was in 2023 and was stable. Evaluated again for pre op clearance on 03/04/23 and cleared:   "Preoperative cardiovascular risk assessment.  Cystoscopy with transurethral resection of bladder tumor by Dr. Liliane Shi on 03/11/2023. Chart reviewed as part of pre-operative protocol coverage. According to the RCRI, patient has a 0.9% risk of MACE. Patient reports activity equivalent to 5.62 METS (per DASI).  Given past medical history and time since last visit, based on ACC/AHA guidelines, Darius [name] would be at acceptable risk for the planned procedure without further cardiovascular testing.   Patient was advised that if he develops new symptoms prior to surgery to contact our office to arrange a follow-up appointment.  he verbalized understanding. Patient is not on Plavix and he stopped aspirin prior to his last surgical procedure.  Discussed resuming aspirin as soon as hemodynamically stable  postoperatively."   VS: BP (!) 141/75   Pulse (!) 58   Temp 37.1 C (Oral)   Resp 16   Ht 5\' 9"  (1.753 m)   Wt 108.4 kg   SpO2 98%   BMI 35.29 kg/m   PROVIDERS: Jeral Pinch, DO Cardiology: Riley Lam, MD  LABS: Labs reviewed: Acceptable for surgery. (all labs ordered are listed, but only abnormal results are displayed)  Labs Reviewed  BASIC METABOLIC PANEL - Abnormal; Notable for the following components:      Result Value   Glucose, Bld 216 (*)    All other components within normal limits  CBC     IMAGES:  CTA Chest 11/13/2021:  IMPRESSION: 1. No pulmonary embolus. 2. Aneurysmal ascending thoracic aorta (4 cm). Recommend annual imaging followup by CTA or MRA. This recommendation follows 2010 ACCF/AHA/AATS/ACR/ASA/SCA/SCAI/SIR/STS/SVM Guidelines for the Diagnosis and Management of Patients with Thoracic Aortic Disease. Circulation. 2010; 121: A540-J811. Aortic aneurysm NOS (ICD10-I71.9). 3. Aortic Atherosclerosis (ICD10-I70.0 - including four-vessel coronary calcification. 4. Emphysema (ICD10-J43.9).   EKG:   CV:  Stress test 05/06/22:    Findings are consistent with ischemia. The study is intermediate risk.   No ST deviation was noted.   LV perfusion is abnormal. There is evidence of ischemia. There is no evidence of infarction. Defect 1: There is a medium defect with mild reduction in uptake present in the mid to basal inferolateral location(s) that is reversible. There is normal wall motion in the defect area. Consistent with ischemia.  Left ventricular function is normal. Nuclear stress EF: 55 %. The left ventricular ejection fraction is normal (55-65%). End diastolic cavity size is normal. End systolic cavity size is normal.  Echo 05/02/22:  IMPRESSIONS    1. Left ventricular ejection fraction, by estimation, is 50 to 55%. Left ventricular ejection fraction by 3D volume is 54 %. The left ventricle has low normal function. The left  ventricle has no regional wall motion abnormalities. There is moderate left  ventricular hypertrophy. Left ventricular diastolic parameters are indeterminate. The average left ventricular global longitudinal strain is -15.7 %. The global longitudinal strain is normal.  2. Right ventricular systolic function is normal. The right ventricular size is moderately enlarged.  3. The mitral valve is normal in structure. No evidence of mitral valve regurgitation. No evidence of mitral stenosis.  4. The aortic valve is normal in structure. Aortic valve regurgitation is not visualized. No aortic stenosis is present.  5. Aortic dilatation noted. There is mild dilatation of the ascending aorta, measuring 42 mm.  6. The inferior vena cava is normal in size with greater than 50% respiratory variability, suggesting right atrial pressure of 3 mmHg.  Past Medical History:  Diagnosis Date   AAA (abdominal aortic aneurysm) (HCC)    followed by cardiologist--- last CTA 11-14-2021,  40mm;  echo 04-29-2022   42 mm   Behcet's disease (HCC)    per cardiologist note   Bladder cancer [facility])    urologist--- dr winter---- first dx 12/ 2019 s/p turbt   CAD (coronary artery disease) 2019   cardiologist--- dr Izora Ribas---- hx inferior STEMI  11-30-2017  s/p  PCI with DES to proxRCA;  normal echo 11-30-2017 in epic;   last NUC 05-06-2022  IR due ischemia but , no ST deviation and  no evidence infarct w/ medium defect w/ normal wall motion, nuclear ef 55%   DOE (dyspnea on exertion)    per pt a little sob w/ stairs but recovers quickly, stated this is usual   Emphysema/COPD [facility])    pt. denies   History of ST elevation myocardial infarction (STEMI) 11/30/2017   s/p  pci w/ des   Hypertension    followed by pcp   Myocardial infarction (HCC)    Nocturia    S/P drug eluting coronary stent placement 11/30/2017   DES x1 to proxRCA    Past Surgical History:  Procedure Laterality Date   CATARACT EXTRACTION W/  INTRAOCULAR LENS IMPLANT Right 07/2022   CORONARY/GRAFT ACUTE MI REVASCULARIZATION N/A 11/30/2017   Procedure: Coronary/Graft Acute MI Revascularization;  Surgeon: Lyn Records, MD;  [facility] INVASIVE CV LAB;  Service: Cardiovascular;  Laterality: N/A;   CYSTOSCOPY N/A 10/15/2022   Procedure: CYSTOSCOPY;  Surgeon: Rene Paci, MD;  [facility];  Service: Urology;  Laterality: N/A;   LEFT HEART CATH AND CORONARY ANGIOGRAPHY N/A 11/30/2017   Procedure: LEFT HEART CATH AND CORONARY ANGIOGRAPHY;  Surgeon: Lyn Records, MD;  [facility]: [facility] CV LAB;  Service: Cardiovascular;  Laterality: N/A;   TRANSURETHRAL RESECTION OF BLADDER TUMOR N/A 12/16/2017   Procedure: TRANSURETHRAL RESECTION OF BLADDER TUMOR (TURBT);  Surgeon: Marcine Matar, MD;  Location: [facility];  Service: Urology;  Laterality: N/A;  90 MINS   TRANSURETHRAL RESECTION OF BLADDER TUMOR WITH MITOMYCIN-C N/A 03/29/2020   Procedure: TRANSURETHRAL RESECTION OF BLADDER TUMOR WITH POST OPERATIVE INSTILLATION OF GEMCITABINE;  Surgeon: Marcine Matar, MD;  Location: [facility];  Service: Urology;  Laterality: N/A;  30  MINS   TRANSURETHRAL RESECTION OF BLADDER TUMOR WITH MITOMYCIN-C N/A 12/17/2020   Procedure: TRANSURETHRAL RESECTION OF BLADDER TUMOR WITH GEMCITABINE IN PACU;  Surgeon: Marcine Matar, MD;  Location: [facility];  Service: Urology;  Laterality: N/A;   TRANSURETHRAL RESECTION OF BLADDER TUMOR WITH MITOMYCIN-C N/A 01/20/2022   Procedure: TRANSURETHRAL RESECTION OF BLADDER TUMOR WITH GEMCITABINE;  Surgeon: Marcine Matar, MD;  Location: [facility];  Service: Urology;  Laterality: N/A;  30 MINS    MEDICATIONS:  atorvastatin (LIPITOR) 80 MG tablet   cephALEXin (KEFLEX) 500 MG capsule   isosorbide mononitrate (IMDUR) 60 MG 24 hr tablet   losartan (COZAAR) 25 MG tablet   meclizine (ANTIVERT) 25 MG tablet   metoprolol tartrate  (LOPRESSOR) 25 MG tablet   Multiple Vitamin (MULTIVITAMIN WITH MINERALS) TABS tablet   nitroGLYCERIN (NITROSTAT) 0.4 MG SL tablet   oxybutynin (DITROPAN) 5 MG tablet   phenazopyridine (PYRIDIUM) 200 MG tablet   varenicline (CHANTIX) 1 MG tablet   No current facility-administered medications for this encounter.    gemcitabine (GEMZAR) chemo syringe for bladder instillation 2,000 mg    Marcille Blanco [facility] Short Stay/Anesthesiology [facility] Phone [phone] 03/10/2023 11:15 AM

## 2023-03-10 NOTE — Anesthesia Preprocedure Evaluation (Signed)
 Anesthesia Evaluation  Patient identified by MRN, date of birth, ID band Patient awake    Reviewed: Allergy & Precautions, NPO status , Patient's Chart, lab work & pertinent test results  History of Anesthesia Complications Negative for: history of anesthetic complications  Airway Mallampati: II  TM Distance: >3 FB Neck ROM: Full    Dental  (+) Dental Advisory Given, Missing,    Pulmonary neg shortness of breath, neg sleep apnea, COPD (no recent flares), neg recent URI, Current Smoker and Patient abstained from smoking., former smoker   Pulmonary exam normal breath sounds clear to auscultation       Cardiovascular hypertension, Pt. on medications and Pt. on home beta blockers (-) angina + CAD, + Past MI (STEMI 11/2017) and + Cardiac Stents (DES to proximal RCA 11/2017)  + dysrhythmias (incomplete RBBB)  Rhythm:Regular Rate:Bradycardia  HLD, aortic dilation, AAA  Stress Test 05/02/2022: - Imdur increased afterwards   Findings are consistent with ischemia. The study is intermediate risk.   No ST deviation was noted.   LV perfusion is abnormal. There is evidence of ischemia. There is no evidence of infarction. Defect 1: There is a medium defect with mild reduction in uptake present in the mid to basal inferolateral location(s) that is reversible. There is normal wall motion in the defect area. Consistent with ischemia.   Left ventricular function is normal. Nuclear stress EF: 55 %. The left ventricular ejection fraction is normal (55-65%). End diastolic cavity size is normal. End systolic cavity size is normal.  TTE 05/02/2022:  1. Left ventricular ejection fraction, by estimation, is 50 to 55%. Left ventricular ejection fraction by 3D volume is 54 %. The left ventricle has low normal function. The left ventricle has no regional wall motion abnormalities. There is moderate left  ventricular hypertrophy. Left ventricular diastolic  parameters are indeterminate. The average left ventricular global longitudinal strain is -15.7 %. The global longitudinal strain is normal.   2. Right ventricular systolic function is normal. The right ventricular size is moderately enlarged.   3. The mitral valve is normal in structure. No evidence of mitral valve regurgitation. No evidence of mitral stenosis.   4. The aortic valve is normal in structure. Aortic valve regurgitation is not visualized. No aortic stenosis is present.   5. Aortic dilatation noted. There is mild dilatation of the ascending aorta, measuring 42 mm.   6. The inferior vena cava is normal in size with greater than 50% respiratory variability, suggesting right atrial pressure of 3 mmHg.      Neuro/Psych neg Seizures BPPV    GI/Hepatic negative GI ROS, Neg liver ROS,,,  Endo/Other  neg diabetes  Thyroid mass  Renal/GU negative Renal ROS   Bladder cancer    Musculoskeletal   Abdominal  (+) + obese  Peds  Hematology negative hematology ROS (+)   Anesthesia Other Findings Behcet's disease  Reproductive/Obstetrics                             Anesthesia Physical Anesthesia Plan  ASA: 3  Anesthesia Plan: General   Post-op Pain Management: Tylenol PO (pre-op)*   Induction: Intravenous  PONV Risk Score and Plan: 3 and Ondansetron, Dexamethasone and Treatment may vary due to age or medical condition  Airway Management Planned: LMA  Additional Equipment:   Intra-op Plan:   Post-operative Plan: Extubation in OR  Informed Consent: I have reviewed the patients History and Physical, chart, labs and discussed  the procedure including the risks, benefits and alternatives for the proposed anesthesia with the patient or authorized representative who has indicated his/her understanding and acceptance.     Dental advisory given  Plan Discussed with: CRNA  Anesthesia Plan Comments: (See PAT note from 2/25 by Sherlie Ban PA-C )         Anesthesia Quick Evaluation

## 2023-03-11 ENCOUNTER — Ambulatory Visit (HOSPITAL_COMMUNITY): Payer: Self-pay | Admitting: Medical

## 2023-03-11 ENCOUNTER — Other Ambulatory Visit: Payer: Self-pay

## 2023-03-11 ENCOUNTER — Encounter (HOSPITAL_COMMUNITY)
Admission: RE | Disposition: A | Discharge status: Home / Self Care | Payer: Self-pay | Source: Home / Self Care | Attending: Urology

## 2023-03-11 ENCOUNTER — Ambulatory Visit (HOSPITAL_COMMUNITY)
Admission: RE | Admit: 2023-03-11 | Discharge: 2023-03-11 | Disposition: A | Discharge status: Home / Self Care | Payer: Commercial Managed Care - HMO | Attending: Urology | Admitting: Urology

## 2023-03-11 ENCOUNTER — Other Ambulatory Visit (HOSPITAL_COMMUNITY): Payer: Self-pay

## 2023-03-11 ENCOUNTER — Encounter (HOSPITAL_COMMUNITY): Payer: Self-pay | Admitting: Urology

## 2023-03-11 ENCOUNTER — Ambulatory Visit (HOSPITAL_BASED_OUTPATIENT_CLINIC_OR_DEPARTMENT_OTHER): Payer: Commercial Managed Care - HMO | Admitting: Anesthesiology

## 2023-03-11 ENCOUNTER — Ambulatory Visit (HOSPITAL_COMMUNITY): Payer: Commercial Managed Care - HMO

## 2023-03-11 DIAGNOSIS — Z79899 Other long term (current) drug therapy: Secondary | ICD-10-CM | POA: Insufficient documentation

## 2023-03-11 DIAGNOSIS — I2510 Atherosclerotic heart disease of native coronary artery without angina pectoris: Secondary | ICD-10-CM

## 2023-03-11 DIAGNOSIS — I252 Old myocardial infarction: Secondary | ICD-10-CM | POA: Diagnosis not present

## 2023-03-11 DIAGNOSIS — D414 Neoplasm of uncertain behavior of bladder: Secondary | ICD-10-CM | POA: Diagnosis not present

## 2023-03-11 DIAGNOSIS — C679 Malignant neoplasm of bladder, unspecified: Secondary | ICD-10-CM

## 2023-03-11 DIAGNOSIS — Z87891 Personal history of nicotine dependence: Secondary | ICD-10-CM | POA: Insufficient documentation

## 2023-03-11 DIAGNOSIS — N492 Inflammatory disorders of scrotum: Secondary | ICD-10-CM | POA: Diagnosis not present

## 2023-03-11 DIAGNOSIS — J449 Chronic obstructive pulmonary disease, unspecified: Secondary | ICD-10-CM | POA: Diagnosis not present

## 2023-03-11 DIAGNOSIS — I10 Essential (primary) hypertension: Secondary | ICD-10-CM | POA: Diagnosis not present

## 2023-03-11 DIAGNOSIS — E785 Hyperlipidemia, unspecified: Secondary | ICD-10-CM | POA: Diagnosis not present

## 2023-03-11 HISTORY — PX: CYSTOSCOPY: SHX5120

## 2023-03-11 HISTORY — PX: TRANSURETHRAL RESECTION OF BLADDER TUMOR: SHX2575

## 2023-03-11 SURGERY — CYSTOSCOPY
Anesthesia: General

## 2023-03-11 MED ORDER — IPRATROPIUM-ALBUTEROL 0.5-2.5 (3) MG/3ML IN SOLN
RESPIRATORY_TRACT | Status: AC
  Administered 2023-03-11: 3 mL via RESPIRATORY_TRACT
  Filled 2023-03-11: qty 3

## 2023-03-11 MED ORDER — PROPOFOL 10 MG/ML IV BOLUS
INTRAVENOUS | Status: AC
  Filled 2023-03-11: qty 20

## 2023-03-11 MED ORDER — CEFAZOLIN SODIUM-DEXTROSE 2-4 GM/100ML-% IV SOLN
2.0000 g | INTRAVENOUS | Status: AC
  Administered 2023-03-11: 2 g via INTRAVENOUS
  Filled 2023-03-11: qty 100

## 2023-03-11 MED ORDER — LACTATED RINGERS IV SOLN: INTRAVENOUS | Status: DC | PRN

## 2023-03-11 MED ORDER — FENTANYL CITRATE (PF) 100 MCG/2ML IJ SOLN
INTRAMUSCULAR | Status: DC | PRN
  Administered 2023-03-11: 50 ug via INTRAVENOUS
  Administered 2023-03-11: 100 ug via INTRAVENOUS

## 2023-03-11 MED ORDER — PROPOFOL 10 MG/ML IV BOLUS
INTRAVENOUS | Status: DC | PRN
  Administered 2023-03-11: 30 mg via INTRAVENOUS
  Administered 2023-03-11: 140 mg via INTRAVENOUS

## 2023-03-11 MED ORDER — OXYCODONE HCL 5 MG PO TABS: 5.0000 mg | ORAL_TABLET | Freq: Once | ORAL | Status: DC | PRN

## 2023-03-11 MED ORDER — LIDOCAINE HCL (CARDIAC) PF 100 MG/5ML IV SOSY
PREFILLED_SYRINGE | INTRAVENOUS | Status: DC | PRN
  Administered 2023-03-11: 80 mg via INTRATRACHEAL

## 2023-03-11 MED ORDER — IPRATROPIUM-ALBUTEROL 0.5-2.5 (3) MG/3ML IN SOLN: 3.0000 mL | RESPIRATORY_TRACT | Status: DC

## 2023-03-11 MED ORDER — SUGAMMADEX SODIUM 200 MG/2ML IV SOLN
INTRAVENOUS | Status: AC
  Filled 2023-03-11: qty 2

## 2023-03-11 MED ORDER — MIDAZOLAM HCL 2 MG/2ML IJ SOLN
INTRAMUSCULAR | Status: DC | PRN
Start: 2023-03-11 — End: 2023-03-11
  Administered 2023-03-11: 2 mg via INTRAVENOUS

## 2023-03-11 MED ORDER — FENTANYL CITRATE (PF) 100 MCG/2ML IJ SOLN
INTRAMUSCULAR | Status: AC
  Filled 2023-03-11: qty 2

## 2023-03-11 MED ORDER — LIDOCAINE HCL 2 % IJ SOLN
INTRAMUSCULAR | Status: DC | PRN
  Administered 2023-03-11: 6 mL

## 2023-03-11 MED ORDER — ORAL CARE MOUTH RINSE: 15.0000 mL | Freq: Once | OROMUCOSAL | Status: AC

## 2023-03-11 MED ORDER — LIDOCAINE HCL 2 % IJ SOLN
INTRAMUSCULAR | Status: AC
  Filled 2023-03-11: qty 20

## 2023-03-11 MED ORDER — LIDOCAINE HCL (PF) 2 % IJ SOLN
INTRAMUSCULAR | Status: AC
  Filled 2023-03-11: qty 5

## 2023-03-11 MED ORDER — OXYCODONE HCL 5 MG/5ML PO SOLN: 5.0000 mg | Freq: Once | ORAL | Status: DC | PRN

## 2023-03-11 MED ORDER — ONDANSETRON HCL 4 MG/2ML IJ SOLN
INTRAMUSCULAR | Status: DC | PRN
  Administered 2023-03-11: 4 mg via INTRAVENOUS

## 2023-03-11 MED ORDER — ACETAMINOPHEN 500 MG PO TABS
1000.0000 mg | ORAL_TABLET | Freq: Once | ORAL | Status: AC
  Administered 2023-03-11: 1000 mg via ORAL
  Filled 2023-03-11: qty 2

## 2023-03-11 MED ORDER — SODIUM CHLORIDE 0.9 % IR SOLN
Status: DC | PRN
  Administered 2023-03-11 (×2): 3000 mL via INTRAVESICAL

## 2023-03-11 MED ORDER — MIDAZOLAM HCL 2 MG/2ML IJ SOLN
INTRAMUSCULAR | Status: AC
  Filled 2023-03-11: qty 2

## 2023-03-11 MED ORDER — DEXAMETHASONE SODIUM PHOSPHATE 10 MG/ML IJ SOLN
INTRAMUSCULAR | Status: DC | PRN
  Administered 2023-03-11: 8 mg via INTRAVENOUS

## 2023-03-11 MED ORDER — DROPERIDOL 2.5 MG/ML IJ SOLN: 0.6250 mg | Freq: Once | INTRAMUSCULAR | Status: DC | PRN

## 2023-03-11 MED ORDER — FENTANYL CITRATE PF 50 MCG/ML IJ SOSY: 25.0000 ug | PREFILLED_SYRINGE | INTRAMUSCULAR | Status: DC | PRN

## 2023-03-11 MED ORDER — 0.9 % SODIUM CHLORIDE (POUR BTL) OPTIME
TOPICAL | Status: DC | PRN
  Administered 2023-03-11: 1000 mL

## 2023-03-11 MED ORDER — OXYCODONE-ACETAMINOPHEN 5-325 MG PO TABS
1.0000 | ORAL_TABLET | ORAL | 0 refills | Status: DC | PRN
  Filled 2023-03-11: qty 20, 4d supply, fill #0

## 2023-03-11 MED ORDER — CHLORHEXIDINE GLUCONATE 0.12 % MT SOLN
15.0000 mL | Freq: Once | OROMUCOSAL | Status: AC
  Administered 2023-03-11: 15 mL via OROMUCOSAL

## 2023-03-11 MED ORDER — ONDANSETRON HCL 4 MG/2ML IJ SOLN
INTRAMUSCULAR | Status: AC
  Filled 2023-03-11: qty 2

## 2023-03-11 SURGICAL SUPPLY — 24 items
BAG URINE DRAIN 2000ML AR STRL (UROLOGICAL SUPPLIES) IMPLANT
BAG URO CATCHER STRL LF (MISCELLANEOUS) ×1 IMPLANT
BNDG GAUZE DERMACEA FLUFF 4 (GAUZE/BANDAGES/DRESSINGS) IMPLANT
CATH URETL OPEN 5X70 (CATHETERS) ×1 IMPLANT
CLOTH BEACON ORANGE TIMEOUT ST (SAFETY) ×1 IMPLANT
DRAPE FOOT SWITCH (DRAPES) ×1 IMPLANT
ELECT PENCIL ROCKER SW 15FT (MISCELLANEOUS) IMPLANT
ELECT REM PT RETURN 15FT ADLT (MISCELLANEOUS) ×1 IMPLANT
GAUZE PACKING IODOFORM 1/2INX (GAUZE/BANDAGES/DRESSINGS) IMPLANT
GLOVE SURG LX STRL 7.5 STRW (GLOVE) ×1 IMPLANT
GOWN STRL SURGICAL XL XLNG (GOWN DISPOSABLE) ×1 IMPLANT
GUIDEWIRE ZIPWRE .038 STRAIGHT (WIRE) ×1 IMPLANT
KIT TURNOVER KIT A (KITS) IMPLANT
LOOP CUT BIPOLAR 24F LRG (ELECTROSURGICAL) IMPLANT
MANIFOLD NEPTUNE II (INSTRUMENTS) ×1 IMPLANT
NDL HYPO 22X1.5 SAFETY MO (MISCELLANEOUS) IMPLANT
NEEDLE HYPO 22X1.5 SAFETY MO (MISCELLANEOUS) ×1 IMPLANT
PACK CYSTO (CUSTOM PROCEDURE TRAY) ×1 IMPLANT
SYR CONTROL 10ML LL (SYRINGE) IMPLANT
SYR TOOMEY IRRIG 70ML (MISCELLANEOUS) IMPLANT
SYRINGE TOOMEY IRRIG 70ML (MISCELLANEOUS) IMPLANT
TUBING CONNECTING 10 (TUBING) ×1 IMPLANT
TUBING UROLOGY SET (TUBING) ×1 IMPLANT
YANKAUER SUCT BULB TIP 10FT TU (MISCELLANEOUS) IMPLANT

## 2023-03-11 NOTE — Anesthesia Procedure Notes (Signed)
 Procedure Name: LMA Insertion Date/Time: 03/11/2023 10:15 AM  Performed by: Micki Riley, CRNAPre-anesthesia Checklist: Patient identified, Emergency Drugs available, Suction available, Patient being monitored and Timeout performed Patient Re-evaluated:Patient Re-evaluated prior to induction Oxygen Delivery Method: Circle system utilized Preoxygenation: Pre-oxygenation with 100% oxygen Induction Type: IV induction LMA: LMA inserted LMA Size: 5.0 Number of attempts: 1

## 2023-03-11 NOTE — Transfer of Care (Signed)
 Immediate Anesthesia Transfer of Care Note  Patient: Darius [name]  Procedure(s) Performed: CYSTOSCOPY TRANSURETHRAL RESECTION OF BLADDER TUMOR (TURBT) INCISION AND DRAINAGE SCROTAL ABSCESS  Patient Location: PACU  Anesthesia Type:General  Level of Consciousness: awake, alert , and oriented  Airway & Oxygen Therapy: Patient Spontanous Breathing  Post-op Assessment: Report given to RN  Post vital signs: stable  Last Vitals:  Vitals Value Taken Time  BP 149/79 03/11/23 1109  Temp    Pulse 72 03/11/23 1110  Resp 19 03/11/23 1110  SpO2 92 % 03/11/23 1110  Vitals shown include unfiled device data.  Last Pain:  Vitals:   03/11/23 0700  TempSrc: Oral         Complications: No notable events documented.

## 2023-03-11 NOTE — Op Note (Signed)
 Operative Note  Preoperative diagnosis:  1.  5 mm papillary bladder tumor involving the anterior bladder neck 2.  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  3 x 5 cm right hemiscrotal abscess  Postoperative diagnosis: 1.  1.  5 mm papillary bladder tumor involving the anterior bladder neck 2.  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  3 x 5 cm right hemiscrotal abscess  Procedure(s): 1.  Cystoscopy with TURBT small 2.  Incision and drainage of scrotal abscess  Surgeon: Rhoderick Moody, MD  Assistants:  None  Anesthesia:  General  Complications:  None  EBL: 10 mL  Specimens: 1.  5 mm papillary bladder tumor 2.  Tissue culture swabs  Drains/Catheters: 1.  Iodoform gauze packing in the right hemiscrotal wound  Intraoperative findings:   5 mm papillary bladder tumor involving the anterior bladder neck.  No other intravesical or urethral abnormalities were seen 3 x 5 cm densely loculated right hemiscrotal abscess  Indication:  Darius [name] is a 68 y.o. male with a history of recurrent low-grade TA urothelial carcinoma of the bladder.  He had a surveillance cystoscopy on 02/26/2023 that revealed a 5 mm papillary bladder tumor at the anterior bladder neck.  During his preop evaluation, the patient developed progressively worsening right hemiscrotal discomfort and was found to have a 3 x 5 cm scrotal abscess.  He has been consented for the above procedures, voices understanding and wishes to proceed.  Description of procedure:  After informed consent was obtained, the patient was brought to the operating room and general LMA anesthesia was administered. The patient was then placed in the dorsolithotomy position and prepped and draped in the usual sterile fashion. A timeout was performed. A 23 French rigid cystoscope was then inserted into the urethral meatus and advanced into the bladder under direct vision. A complete  bladder survey revealed a 5 mm papillary bladder tumor involving the anterior bladder neck with no other intravesical or urethral abnormalities.  The rigid cystoscope was then exchanged for a 26 French resectoscope with a bipolar loop working element.  The 5 mm papillary bladder tumor was then resected down to the bladder neck detrusor musculature.  The specimen was then siphon out of the bladder through the sheath of the cystoscope and sent for permanent section.  The area of resection was then extensively fulgurated until hemostasis was achieved.  The patient's bladder was then completely drained.  A 2 cm right upper hemiscrotal incision was made and a large amount of purulent fluid was immediately drained from the 3 x 5 cm abscess.  There was several dense loculations within the abscess cavity that were bluntly lysed with a hemostat.  I then packed the wound with iodoform gauze.  The patient's scrotum was then dressed appropriately.  He was then awoken from anesthesia having tolerated the procedure well.  He was transferred to the postanesthesia in stable condition.  Plan: The patient has been instructed to remove approximately 6-8 inches of his iodoform gauze per day until the packing is completely removed.  He is already on a 1 week course of keflex.  Follow-up 03/23/2023 to discuss pathology results

## 2023-03-11 NOTE — H&P (Addendum)
 Office Visit Report     02/26/2023   --------------------------------------------------------------------------------   Darius [name]  MRN: [id number]  DOB: 1955/09/05, 68 year old Male  PRIMARY CARE:  Erlinda Hong, MD  PRIMARY CARE FAX:  [phone]  REFERRING:  Doyne Keel, MD  PROVIDER:  Rhoderick Moody, M.D.  LOCATION:  [facility] [phone]     --------------------------------------------------------------------------------   CC/HPI: Bladder cancer   [name] is a 68 year-old male, previously followed by Dr. 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 He had a subsequent UCC recurrences in 2022 and 01/2022 with the same pathology. No prior BCG treatments.   09/25/2022: The patient is here today for routine follow-up and surveillance cystoscopy. He has done well over the past several months and denies interval UTIs, dysuria or hematuria.   10/27/22: The patient is here today for a routine follow-up. He is s/p TURBT on 10/15/22, with tumor path showing low grade Ta UCC. He has done well after surgery and denies any residual hematuria or dysuria.   02/26/23: The patient is here today for a routine follow-up and surveillance cystoscopy. He recently completed induction BCG, which he tolerated well.     ALLERGIES: No Allergies    MEDICATIONS: Atorvastatin Calcium 80 mg tablet  Clopidogrel 75 mg tablet  Diclofenac Sodium 1 % gel  Isosorbide Mononitrate Er 60 mg tablet, extended release 24 hr  Losartan Potassium 25 mg tablet  Nitroglycerin 0.4 mg tablet, sublingual     GU PSH: Bladder Instill AntiCA Agent - 01/08/2023, 01/01/2023, 12/25/2022, 12/18/2022, 12/04/2022, 11/27/2022, 01/20/2022, 12/17/2020, 2022 Cystoscopy - 09/25/2022, 05/23/2022, 12/25/2021, 06/26/2021, 11/30/2020, 2022, 2022, 2021, 2020, 2019 Cystoscopy Irrigate  Clot - 2019 Cystoscopy TURBT <2 cm - 01/20/2022, 12/17/2020 Cystoscopy TURBT >5 cm - 2019 Cystoscopy TURBT 2-5 cm - 2022 Insert Bladder Cath; Complex - 2019       PSH Notes: Hernia Repair   NON-GU PSH: CABG (coronary artery bypass grafting) Cardiac Stent Placement Hernia Repair - 2014 Visit Complexity (formerly GPC1X) - 10/27/2022     GU PMH: Bladder Cancer Anterior - 01/08/2023, - 12/04/2022, - 10/27/2022, - 12/25/2021 Bladder Cancer overlapping sites - 01/01/2023, - 12/25/2022, - 12/18/2022, - 11/27/2022, History of recurrent low-grade nonmuscle invasive bladder cancer. Currently cystoscopy fine., - 05/23/2022, Recently underwent his fourth TURBT for recurrent low-grade nonmuscle invasive bladder cancer. He is a long-term smoker but is trying to quit (his wife still smokes in the house), - 02/14/2022 History of bladder cancer - 09/25/2022 Bladder Cancer Lateral, No evidence of recurrence today. He is still smoking. - 06/26/2021, Several recurrences, status post recent repeat TURBT, similar low-grade nonmuscle invasive bladder cancer, - 2023, He has recurrences, - 11/30/2020, No evidence of recurrence , - 2022, He was noted to have 2 recurrences on cystoscopy today., - 2022, No evidence of recurrence based on cystoscopy today., - 2021 (Stable), Cystoscopy today is clear, - 2020 (Worsening), 2 small recurrences, both treated with electro cautery today., - 2020 (Worsening), There was a small recurrence at the bladder neck today that was treated with cautery, - 2020, Large low-grade papillary noninvasive bladder cancer, resected on 12.4.2019. He is doing well., - 2019 ED due to arterial insufficiency, He would like to try PDE 5 inhibitors. - 2020 Bladder tumor/neoplasm - 2019 Gross hematuria - 2019 BPH w/o LUTS, Benign prostate hyperplasia - 2014 Encounter for Prostate Cancer screening, Prostate  cancer screening - 2014 Nocturia, Nocturia - 2014 Spermatocele of epididymis, Unspec, Spermatocele -  2014    NON-GU PMH: Encounter for general adult medical examination without abnormal findings, Encounter for preventive health examination - 2014 Depression Hypertension Myocardial Infarction    FAMILY HISTORY: 1 Daughter - Daughter 2 sons - Son Blood In Urine - Runs in Family Death In The Family Father - Father Death In The Family Mother - Mother Death of family member - Runs In Family   SOCIAL HISTORY: Marital Status: Married Ethnicity: Not Hispanic Or Latino Current Smoking Status: Patient does not smoke anymore. Has not smoked since 11/14/2007. Smoked for 20 years. Smoked 1 pack per day.   Tobacco Use Assessment Completed: Used Tobacco in last 30 days? Has never drank.  Does not drink caffeine. 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     Notes: Number of children, Occupation, Caffeine use, Alcohol use, Married, Current every day smoker   REVIEW OF SYSTEMS:    GU Review Male:   Patient denies frequent urination, hard to postpone urination, burning/ pain with urination, get up at night to urinate, leakage of urine, stream starts and stops, trouble starting your stream, have to strain to urinate , erection problems, and penile pain.  Gastrointestinal (Upper):   Patient denies nausea, vomiting, and indigestion/ heartburn.  Gastrointestinal (Lower):   Patient denies diarrhea and constipation.  Constitutional:   Patient denies fever, night sweats, weight loss, and fatigue.  Skin:   Patient denies skin rash/ lesion and itching.  Eyes:   Patient denies blurred vision and double vision.  Ears/ Nose/ Throat:   Patient denies sore throat and sinus problems.  Hematologic/Lymphatic:   Patient denies swollen glands and easy bruising.  Cardiovascular:   Patient denies leg swelling and chest pains.  Respiratory:   Patient denies cough and shortness of breath.  Endocrine:   Patient denies excessive thirst.  Musculoskeletal:   Patient denies back pain and joint pain.  Neurological:    Patient denies headaches and dizziness.  Psychologic:   Patient denies depression and anxiety.   VITAL SIGNS: None   GU PHYSICAL EXAMINATION:    Urethral Meatus: Normal size. No lesion, no wart, no discharge, no polyp. Normal location.  Penis: Circumcised, no warts, no cracks. No dorsal Peyronie's plaques, no left corporal Peyronie's plaques, no right corporal Peyronie's plaques, no scarring, no warts. No balanitis, no meatal stenosis.   MULTI-SYSTEM PHYSICAL EXAMINATION:    Constitutional: Well-nourished. No physical deformities. Normally developed. Good grooming.  Neurologic / Psychiatric: Oriented to time, oriented to place, oriented to person. No depression, no anxiety, no agitation.     Complexity of Data:   11/20/12  PSA  Total PSA 0.58     PROCEDURES:         Flexible Cystoscopy - 52000  Risks, benefits, and some of the potential complications of the procedure were discussed at length with the patient including infection, bleeding, voiding discomfort, urinary retention, fever, chills, sepsis, and others. All questions were answered. Informed consent was obtained. Antibiotic prophylaxis was given. Sterile technique and intraurethral analgesia were used.  Meatus:  Normal size. Normal location. Normal condition.  Urethra:  No strictures.  External Sphincter:  Normal.  Verumontanum:  Normal.  Prostate:  Non-obstructing. No hyperplasia.  Bladder Neck:  Non-obstructing.  Ureteral Orifices:  Normal location. Normal size. Normal shape. Effluxed clear urine.  Bladder:  1/2 cm tumor seen at the anterior bladder base on retroflexion. No trabeculation. Normal mucosa. No stones.  The lower urinary tract was carefully examined. The procedure was well-tolerated and without complications. Antibiotic instructions were given. Instructions were given to call the office immediately for bloody urine, difficulty urinating, urinary retention, painful or frequent urination, fever, chills, nausea,  vomiting or other illness. The patient stated that he understood these instructions and would comply with them.         Urinalysis w/Scope Dipstick Dipstick Cont'd Micro  Color: Yellow Bilirubin: Neg mg/dL WBC/hpf: 0 - 5/hpf  Appearance: Clear Ketones: Neg mg/dL RBC/hpf: NS (Not Seen)  Specific Gravity: 1.020 Blood: Neg ery/uL Bacteria: Rare (0-9/hpf)  pH: 5.5 Protein: 1+ mg/dL Cystals: NS (Not Seen)  Glucose: Neg mg/dL Urobilinogen: 0.2 mg/dL Casts: NS (Not Seen)    Nitrites: Neg Trichomonas: Not Present    Leukocyte Esterase: Neg leu/uL Mucous: Present      Epithelial Cells: NS (Not Seen)      Yeast: NS (Not Seen)      Sperm: Not Present    ASSESSMENT:      ICD-10 Details  1 GU:   Bladder Cancer Anterior - C67.3 Undiagnosed New Problem   PLAN:           Orders Labs Urine Culture          Schedule Return Visit/Planned Activity: Next Available Appointment - Schedule Surgery          Document Letter(s):  Created for Erlinda Hong, MD   Created for Patient: Clinical Summary         Notes:   -Cystoscopy today revealed a 5 mm papillary bladder tumor at the bladder base that was seen on retroflexion.   The risks, benefits and alternatives of cystoscopy with TURBT was discussed with the patient. The risks include, but are not limited to, bleeding, urinary tract infection, bladder perforation requiring prolonged catheterization and/or open bladder repair, ureteral obstruction, voiding dysfunction and the inherent risks of general anesthesia. The patient voices understanding and wishes to proceed.     03/11/23:  The patient has since developed a painful area measuring 3x5 cm involving the upper portion of the right hemiscrotum, concerning for a subcutaneous abscess, which he states that he gets all over his body intermittently.  In addition to cystoscopy with TURBT, we will proceed with incision and drainage of his scrotal abscess. Risks, benefits and alternatives discussed.

## 2023-03-11 NOTE — Anesthesia Postprocedure Evaluation (Signed)
 Anesthesia Post Note  Patient: Darius [name]  Procedure(s) Performed: CYSTOSCOPY TRANSURETHRAL RESECTION OF BLADDER TUMOR (TURBT) INCISION AND DRAINAGE SCROTAL ABSCESS     Patient location during evaluation: PACU Anesthesia Type: General Level of consciousness: sedated and patient cooperative Pain management: pain level controlled Vital Signs Assessment: post-procedure vital signs reviewed and stable Respiratory status: spontaneous breathing Cardiovascular status: stable Anesthetic complications: no   No notable events documented.  Last Vitals:  Vitals:   03/11/23 1230 03/11/23 1245  BP: (!) 148/68 134/63  Pulse: 87 91  Resp: 20   Temp:    SpO2: 94% 92%    Last Pain:  Vitals:   03/11/23 1245  TempSrc:   PainSc: 0-No pain                 Lewie Loron

## 2023-03-12 ENCOUNTER — Other Ambulatory Visit (HOSPITAL_COMMUNITY): Payer: Self-pay

## 2023-03-12 ENCOUNTER — Encounter (HOSPITAL_COMMUNITY): Payer: Self-pay | Admitting: Urology

## 2023-03-12 LAB — SURGICAL PATHOLOGY

## 2023-03-16 LAB — AEROBIC/ANAEROBIC CULTURE W GRAM STAIN (SURGICAL/DEEP WOUND)

## 2023-03-23 ENCOUNTER — Other Ambulatory Visit (HOSPITAL_COMMUNITY): Payer: Self-pay

## 2023-03-25 ENCOUNTER — Other Ambulatory Visit (HOSPITAL_COMMUNITY): Payer: Self-pay

## 2023-04-08 ENCOUNTER — Other Ambulatory Visit (HOSPITAL_COMMUNITY): Payer: Self-pay

## 2023-04-17 ENCOUNTER — Ambulatory Visit: Payer: Commercial Managed Care - HMO | Attending: Internal Medicine

## 2023-04-24 ENCOUNTER — Ambulatory Visit (HOSPITAL_COMMUNITY): Payer: Commercial Managed Care - HMO

## 2023-05-20 ENCOUNTER — Telehealth: Payer: Self-pay

## 2023-05-20 NOTE — Telephone Encounter (Signed)
 Patient returned RN's call.

## 2023-05-20 NOTE — Telephone Encounter (Signed)
 Called pt left a message to call back.  Would like to advise of MD recommendation d/t insurance denial for CT scan.  "Patient CT Aorta indication is mild, aortic dilation.   Presently guidelines recommend annular imaging follow up; most cost effective would be echocardiogram.  We can offer this test to him.  He may choose instead to do this test once he has active insurance   Gloriann Larger, MD FASE [facility] "

## 2023-05-20 NOTE — Telephone Encounter (Signed)
 Spoke with pt advised of MD recommendation.  Pt reports now has Medicare A&B provided with the following number: [id number].  Gave number to check in/out staff to input.  Will have CT Aorta rerun through new insurance once information is input into EPIC.

## 2023-06-24 NOTE — Telephone Encounter (Signed)
 Pt has decided will call to schedule testing when convenient.  Note on order that pt will call to scheduled.

## 2023-06-30 ENCOUNTER — Other Ambulatory Visit (HOSPITAL_COMMUNITY): Payer: Self-pay

## 2023-07-07 ENCOUNTER — Other Ambulatory Visit (HOSPITAL_COMMUNITY): Payer: Self-pay

## 2023-08-08 ENCOUNTER — Other Ambulatory Visit (HOSPITAL_COMMUNITY): Payer: Self-pay

## 2023-08-08 ENCOUNTER — Other Ambulatory Visit: Payer: Self-pay

## 2023-08-10 ENCOUNTER — Ambulatory Visit (HOSPITAL_COMMUNITY)
Admission: RE | Admit: 2023-08-10 | Discharge: 2023-08-10 | Disposition: A | Discharge status: Home / Self Care | Source: Ambulatory Visit | Attending: Cardiology | Admitting: Cardiology

## 2023-08-10 DIAGNOSIS — M352 Behcet's disease: Secondary | ICD-10-CM | POA: Insufficient documentation

## 2023-08-10 DIAGNOSIS — I10 Essential (primary) hypertension: Secondary | ICD-10-CM | POA: Insufficient documentation

## 2023-08-10 DIAGNOSIS — I77819 Aortic ectasia, unspecified site: Secondary | ICD-10-CM

## 2023-08-10 DIAGNOSIS — I7121 Aneurysm of the ascending aorta, without rupture: Secondary | ICD-10-CM | POA: Insufficient documentation

## 2023-08-10 DIAGNOSIS — I25118 Atherosclerotic heart disease of native coronary artery with other forms of angina pectoris: Secondary | ICD-10-CM | POA: Diagnosis not present

## 2023-08-10 MED ORDER — IOHEXOL 350 MG/ML SOLN
80.0000 mL | Freq: Once | INTRAVENOUS | Status: AC | PRN
  Administered 2023-08-10: 80 mL via INTRAVENOUS

## 2023-08-14 ENCOUNTER — Ambulatory Visit: Payer: Self-pay | Admitting: Internal Medicine

## 2023-08-14 DIAGNOSIS — I77819 Aortic ectasia, unspecified site: Secondary | ICD-10-CM

## 2023-08-14 DIAGNOSIS — I7121 Aneurysm of the ascending aorta, without rupture: Secondary | ICD-10-CM

## 2023-08-14 DIAGNOSIS — I25118 Atherosclerotic heart disease of native coronary artery with other forms of angina pectoris: Secondary | ICD-10-CM

## 2023-09-04 ENCOUNTER — Other Ambulatory Visit (HOSPITAL_COMMUNITY): Payer: Self-pay

## 2023-09-07 ENCOUNTER — Other Ambulatory Visit (HOSPITAL_COMMUNITY): Payer: Self-pay

## 2023-09-07 MED ORDER — CEPHALEXIN 500 MG PO CAPS
500.0000 mg | ORAL_CAPSULE | Freq: Two times a day (BID) | ORAL | 0 refills | Status: DC
  Filled 2023-09-07: qty 14, 7d supply, fill #0

## 2023-09-08 ENCOUNTER — Other Ambulatory Visit (HOSPITAL_COMMUNITY): Payer: Self-pay

## 2023-10-08 ENCOUNTER — Other Ambulatory Visit (HOSPITAL_COMMUNITY): Payer: Self-pay

## 2023-10-08 ENCOUNTER — Other Ambulatory Visit: Payer: Self-pay | Admitting: Internal Medicine

## 2023-10-08 ENCOUNTER — Other Ambulatory Visit: Payer: Self-pay

## 2023-10-08 DIAGNOSIS — M352 Behcet's disease: Secondary | ICD-10-CM

## 2023-10-08 DIAGNOSIS — I25118 Atherosclerotic heart disease of native coronary artery with other forms of angina pectoris: Secondary | ICD-10-CM

## 2023-10-08 DIAGNOSIS — I7121 Aneurysm of the ascending aorta, without rupture: Secondary | ICD-10-CM

## 2023-10-08 DIAGNOSIS — I10 Essential (primary) hypertension: Secondary | ICD-10-CM

## 2023-10-08 DIAGNOSIS — I77819 Aortic ectasia, unspecified site: Secondary | ICD-10-CM

## 2023-10-08 MED ORDER — ATORVASTATIN CALCIUM 80 MG PO TABS
80.0000 mg | ORAL_TABLET | Freq: Every day | ORAL | 0 refills | Status: DC
  Filled 2023-10-08: qty 90, 90d supply, fill #0

## 2023-10-08 MED ORDER — LOSARTAN POTASSIUM 25 MG PO TABS
25.0000 mg | ORAL_TABLET | Freq: Every day | ORAL | 0 refills | Status: DC
  Filled 2023-10-08: qty 30, 30d supply, fill #0
  Filled 2023-11-10: qty 30, 30d supply, fill #1
  Filled 2023-12-04: qty 30, 30d supply, fill #2

## 2023-10-08 MED ORDER — ISOSORBIDE MONONITRATE ER 60 MG PO TB24
60.0000 mg | ORAL_TABLET | Freq: Every day | ORAL | 0 refills | Status: DC
  Filled 2023-10-08: qty 30, 30d supply, fill #0
  Filled 2023-11-10: qty 30, 30d supply, fill #1
  Filled 2023-12-04: qty 30, 30d supply, fill #2

## 2023-10-09 ENCOUNTER — Other Ambulatory Visit (HOSPITAL_COMMUNITY): Payer: Self-pay

## 2023-11-06 ENCOUNTER — Other Ambulatory Visit (HOSPITAL_COMMUNITY): Payer: Self-pay

## 2023-11-06 ENCOUNTER — Ambulatory Visit: Attending: Internal Medicine | Admitting: Internal Medicine

## 2023-11-06 VITALS — BP 128/70 | HR 64 | Ht 68.0 in | Wt 236.0 lb

## 2023-11-06 DIAGNOSIS — I25118 Atherosclerotic heart disease of native coronary artery with other forms of angina pectoris: Secondary | ICD-10-CM | POA: Diagnosis present

## 2023-11-06 DIAGNOSIS — I10 Essential (primary) hypertension: Secondary | ICD-10-CM | POA: Insufficient documentation

## 2023-11-06 DIAGNOSIS — I7121 Aneurysm of the ascending aorta, without rupture: Secondary | ICD-10-CM | POA: Insufficient documentation

## 2023-11-06 MED ORDER — NITROGLYCERIN 0.4 MG SL SUBL
0.4000 mg | SUBLINGUAL_TABLET | SUBLINGUAL | 11 refills | Status: DC | PRN
  Filled 2023-11-06: qty 25, 5d supply, fill #0

## 2023-11-06 NOTE — Patient Instructions (Signed)
 Medication Instructions:  Continue all medications *If you need a refill on your cardiac medications before your next appointment, please call your pharmacy*  Lab Work: Bmet to be done 1 week before chest ct   Testing/Procedures: Chest CT to be scheduled in 07/2024  Follow-Up: At [facility], you and your health needs are our priority.  As part of our continuing mission to provide you with exceptional heart care, our providers are all part of one team.  This team includes your primary Cardiologist (physician) and Advanced Practice Providers or APPs (Physician Assistants and Nurse Practitioners) who all work together to provide you with the care you need, when you need it.  Your next appointment:   1 year   Call in May to schedule Oct appointment     Provider:  Dr.Chandrasekar    We recommend signing up for the patient portal called MyChart.  Sign up information is provided on this After Visit Summary.  MyChart is used to connect with patients for Virtual Visits (Telemedicine).  Patients are able to view lab/test results, encounter notes, upcoming appointments, etc.  Non-urgent messages can be sent to your provider as well.   To learn more about what you can do with MyChart, go to [email or web address].

## 2023-11-06 NOTE — Progress Notes (Signed)
 Cardiology Office Note:  .    Date:  11/06/2023  ID:  Wells CHRISTELLA Handsome, DOB 1955-06-19, MRN [id number] PCP: Heddy Barren, DO  [facility] Providers Cardiologist:  None     CC: CAD f/u  History of Present Illness: .    Ezel DONTRELLE MAZON is a 68 y.o. male with a history of inferior STEMI with residual LAD stenosis treated medically (2019) 2022: With Dr. Claudene- mild aortic dilation ~ 40 mm.  Mr. Hinderliter, a 68 year old male with a history of Behcet's disease, active smoking, hyperlipidemia, and hypertension, presents for one year f/u.  Patient notes that he is doing well.   Since last visit notes being very active. Walks briskly and feels he is doing better than average for his age. . There are no interval hospital/ED visit.   Only  chest pain or pressure with lots of stress or very strong exercise..  No SOB/DOE and no PND/Orthopnea.  No weight gain or leg swelling.  No palpitations or syncope.  Still smoking.  Relevant histories: .  Social- just stopped smoking (2024).  Former Programmer, multimedia patient (2022). ROS: As per HPI.   Studies Reviewed: .   Cardiac Studies & Procedures   ______________________________________________________________________________________________ CARDIAC CATHETERIZATION  CARDIAC CATHETERIZATION 11/30/2017  Conclusion  A stent was successfully placed.  A stent was successfully placed.   Inferior ST elevation myocardial infarction due to 100% occlusion of the proximal RCA.  Successful therapy with proximal and distal vessel stenting reducing 100% and 80% stenoses to 10% and 0% respectively with improvement in TIMI flow from 0 to III.  22 x 4.0 mm Onyx stents were used with the proximal stent being postdilated to 4.5 mm in diameter.  Normal left main.  LAD contains segmental 60 to 70% mid vessel narrowing.  Large normal ramus intermedius.  Circumflex gives origin to 2 small obtuse marginal branches with a second marginal containing 60 to 70% diffuse  disease in the mid vessel.  Inferobasal akinesis, overall LVEF greater than 60%.  LVEDP 20.  Delayed reperfusion due to tortuous, angulated innominate/aortic arch anatomy.  Procedure complicated by Bezold-Jarisch response with profound hypotension and bradycardia requiring intravenous atropine  and IV Levophed  for heart rate and blood pressure support.  RECOMMENDATIONS:   IV fluid at a rate of 100 cc/h.  Wean Levophed  once blood pressure is consistently above 100 mmHg.  Continue IV ReoPro for 8 hours.  Aspirin  and Plavix  dual antiplatelet therapy for 12 months.  High intensity statin therapy.  Institute beta-blocker therapy as tolerated by heart rate and blood pressure.  Consider angiotensin converting enzyme inhibitor or angiotensin receptor blocker for blood pressure control if required.  Patient has been on diuretic therapy as monotherapy for hypertension.  Smoking cessation.    Recommend uninterrupted dual antiplatelet therapy with Aspirin  81mg  daily and Ticagrelor  90mg  twice daily for a minimum of 12 months (ACS - Class I recommendation).  Findings Coronary Findings Diagnostic  Dominance: Right  Left Anterior Descending Prox LAD lesion is 65% stenosed.  Left Circumflex  Second Obtuse Marginal Branch 2nd Mrg lesion is 70% stenosed.  Right Coronary Artery Prox RCA lesion is 100% stenosed. Mid RCA lesion is 80% stenosed.  Right Posterior Atrioventricular Artery Post Atrio lesion is 100% stenosed.  Intervention  Prox RCA lesion Stent A stent was successfully placed. Post-Intervention Lesion Assessment The intervention was successful. There is a 10% residual stenosis post intervention.  Mid RCA lesion Stent A stent was successfully placed. Post-stent angioplasty was performed. Post-Intervention Lesion Assessment The  intervention was successful. There is a 0% residual stenosis post intervention.   STRESS TESTS  MYOCARDIAL PERFUSION IMAGING  05/06/2022  Interpretation Summary   Findings are consistent with ischemia. The study is intermediate risk.   No ST deviation was noted.   LV perfusion is abnormal. There is evidence of ischemia. There is no evidence of infarction. Defect 1: There is a medium defect with mild reduction in uptake present in the mid to basal inferolateral location(s) that is reversible. There is normal wall motion in the defect area. Consistent with ischemia.   Left ventricular function is normal. Nuclear stress EF: 55 %. The left ventricular ejection fraction is normal (55-65%). End diastolic cavity size is normal. End systolic cavity size is normal.   ECHOCARDIOGRAM  ECHOCARDIOGRAM COMPLETE 05/02/2022  Narrative ECHOCARDIOGRAM REPORT    Patient Name:   TALBOT MONARCH [facility] Date of Exam: 05/02/2022 Medical Rec #:  [id number]      Height:       69.0 in Accession #:    [id number]     Weight:       252.4 lb Date of Birth:  [birth year]      BSA:          2.281 m Patient Age:    66 years       BP:           138/72 mmHg Patient Gender: M              HR:           58 bpm. Exam Location:  [address]  Procedure: 2D Echo, 3D Echo, Cardiac Doppler, Color Doppler and Strain Analysis  Indications:    R06.00 Dyspnea  History:        Patient has prior history of Echocardiogram examinations, most recent 05/03/2020. Previous Myocardial Infarction and CAD, STENT; COPD. AAA.  Sonographer:    Carl Coma RDCS Referring Phys: [id number] JACKEE VEAR RADDLE DICK  IMPRESSIONS   1. Left ventricular ejection fraction, by estimation, is 50 to 55%. Left ventricular ejection fraction by 3D volume is 54 %. The left ventricle has low normal function. The left ventricle has no regional wall motion abnormalities. There is moderate left ventricular hypertrophy. Left ventricular diastolic parameters are indeterminate. The average left ventricular global longitudinal strain is -15.7 %. The global longitudinal strain is normal. 2. Right  ventricular systolic function is normal. The right ventricular size is moderately enlarged. 3. The mitral valve is normal in structure. No evidence of mitral valve regurgitation. No evidence of mitral stenosis. 4. The aortic valve is normal in structure. Aortic valve regurgitation is not visualized. No aortic stenosis is present. 5. Aortic dilatation noted. There is mild dilatation of the ascending aorta, measuring 42 mm. 6. The inferior vena cava is normal in size with greater than 50% respiratory variability, suggesting right atrial pressure of 3 mmHg.  Comparison(s): Prior images reviewed side by side.  FINDINGS Left Ventricle: Left ventricular ejection fraction, by estimation, is 50 to 55%. Left ventricular ejection fraction by 3D volume is 54 %. The left ventricle has low normal function. The left ventricle has no regional wall motion abnormalities. The average left ventricular global longitudinal strain is -15.7 %. The global longitudinal strain is normal. The left ventricular internal cavity size was normal in size. There is moderate left ventricular hypertrophy. Left ventricular diastolic parameters are indeterminate.  Right Ventricle: The right ventricular size is moderately enlarged. No increase in right ventricular wall thickness. Right ventricular systolic function  is normal.  Left Atrium: Left atrial size was normal in size.  Right Atrium: Right atrial size was normal in size.  Pericardium: There is no evidence of pericardial effusion.  Mitral Valve: The mitral valve is normal in structure. No evidence of mitral valve regurgitation. No evidence of mitral valve stenosis.  Tricuspid Valve: The tricuspid valve is normal in structure. Tricuspid valve regurgitation is not demonstrated. No evidence of tricuspid stenosis.  Aortic Valve: The aortic valve is normal in structure. Aortic valve regurgitation is not visualized. No aortic stenosis is present.  Pulmonic Valve: The pulmonic  valve was normal in structure. Pulmonic valve regurgitation is trivial. No evidence of pulmonic stenosis.  Aorta: Aortic dilatation noted. There is mild dilatation of the ascending aorta, measuring 42 mm.  Venous: The inferior vena cava is normal in size with greater than 50% respiratory variability, suggesting right atrial pressure of 3 mmHg.  IAS/Shunts: No atrial level shunt detected by color flow Doppler.   LEFT VENTRICLE PLAX 2D LVIDd:         4.60 cm         Diastology LVIDs:         3.45 cm         LV e' medial:    5.98 cm/s LV PW:         1.40 cm         LV E/e' medial:  13.1 LV IVS:        1.40 cm         LV e' lateral:   7.83 cm/s LVOT diam:     2.40 cm         LV E/e' lateral: 10.0 LV SV:         97 LV SV Index:   42              2D LVOT Area:     4.52 cm        Longitudinal Strain 2D Strain GLS  -15.0 % (A2C): 2D Strain GLS  -15.9 % (A3C): 2D Strain GLS  -16.2 % (A4C): 2D Strain GLS  -15.7 % Avg:  3D Volume EF LV 3D EF:    Left ventricul ar ejection fraction by 3D volume is 54 %.  3D Volume EF: 3D EF:        54 % LV EDV:       176 ml LV ESV:       80 ml LV SV:        96 ml  RIGHT VENTRICLE             IVC RV Basal diam:  4.50 cm     IVC diam: 1.50 cm RV S prime:     12.60 cm/s TAPSE (M-mode): 2.9 cm  LEFT ATRIUM             Index        RIGHT ATRIUM           Index LA diam:        4.90 cm 2.15 cm/m   RA Area:     20.00 cm LA Vol (A2C):   61.9 ml 27.14 ml/m  RA Volume:   61.40 ml  26.92 ml/m LA Vol (A4C):   63.4 ml 27.80 ml/m LA Biplane Vol: 63.3 ml 27.75 ml/m AORTIC VALVE LVOT Vmax:   82.30 cm/s LVOT Vmean:  54.500 cm/s LVOT VTI:    0.214 m  AORTA Ao Root diam: 4.20 cm Ao Asc diam:  4.20 cm  MITRAL VALVE               TRICUSPID VALVE MV Area (PHT): 3.72 cm    TR Peak grad:   18.1 mmHg MV Decel Time: 204 msec    TR Vmax:        213.00 cm/s MV E velocity: 78.35 cm/s MV A velocity: 64.95 cm/s  SHUNTS MV E/A ratio:  1.21         Systemic VTI:  0.21 m Systemic Diam: 2.40 cm  Oneil Parchment MD Electronically signed by Oneil Parchment MD Signature Date/Time: 05/02/2022/12:30:49 PM    Final          ______________________________________________________________________________________________      Physical Exam:    VS:  BP 128/70 (BP Location: Left Arm, Patient Position: Sitting, Cuff Size: Large)   Pulse 64   Ht 5' 8 (1.727 m)   Wt 236 lb (107 kg)   SpO2 95%   BMI 35.88 kg/m    Wt Readings from Last 3 Encounters:  11/06/23 236 lb (107 kg)  03/11/23 239 lb (108.4 kg)  03/10/23 239 lb (108.4 kg)    Gen: no distress, morbid obesity  Neck: No JVD Ears: no Dempsey Sign Cardiac: No Rubs or Gallops, no murmur, regular bradycardia radial pulses Respiratory: Clear to auscultation bilaterally, normal effort, normal  respiratory rate GI: Soft, nontender, non-distended  MS: No edema;  moves all extremities Integument: Skin feels warm Neuro:  At time of evaluation, alert and oriented to person/place/time/situation  Psych: Normal affect, patient feels well   ASSESSMENT AND PLAN: .    Coronary Artery Disease - Prior inferior STEMI with RCA stent in 2019 and residual LAD disease. Asymptomatic with good functional capacity. -Continue current medications, needs PRN nitroglycerin  refill  Aortic Dilation - Mildly dilated aortic root with aortic atherosclerosis. Stable size on recent echocardiogram. -Plan for repeat CT in 2026 for surveillance.  Tobacco abuse - discussed return to smoking cessation  Hyperlipidemia Morbid Obesity - Well controlled on Atorvastatin  80mg  daily with LDL under 55 in 2022. -Continue Atorvastatin  80mg  daily. - I have asked for his PCP labs  Hypertension -Continue current antihypertensive regimen.  One year with me   Stanly Leavens, MD FASE [facility] Cardiologist [facility]  [address] [place], [address] [place], [place] [address] [phone]  5:39 PM

## 2023-11-10 ENCOUNTER — Other Ambulatory Visit (HOSPITAL_COMMUNITY): Payer: Self-pay

## 2023-12-04 ENCOUNTER — Other Ambulatory Visit (HOSPITAL_COMMUNITY): Payer: Self-pay

## 2023-12-11 ENCOUNTER — Other Ambulatory Visit (HOSPITAL_COMMUNITY): Payer: Self-pay

## 2024-01-10 ENCOUNTER — Other Ambulatory Visit: Payer: Self-pay | Admitting: Internal Medicine

## 2024-01-10 DIAGNOSIS — I77819 Aortic ectasia, unspecified site: Secondary | ICD-10-CM

## 2024-01-10 DIAGNOSIS — M352 Behcet's disease: Secondary | ICD-10-CM

## 2024-01-10 DIAGNOSIS — I7121 Aneurysm of the ascending aorta, without rupture: Secondary | ICD-10-CM

## 2024-01-10 DIAGNOSIS — I25118 Atherosclerotic heart disease of native coronary artery with other forms of angina pectoris: Secondary | ICD-10-CM

## 2024-01-10 DIAGNOSIS — I10 Essential (primary) hypertension: Secondary | ICD-10-CM

## 2024-01-11 ENCOUNTER — Other Ambulatory Visit: Payer: Self-pay

## 2024-01-11 ENCOUNTER — Other Ambulatory Visit (HOSPITAL_COMMUNITY): Payer: Self-pay

## 2024-01-11 MED ORDER — ISOSORBIDE MONONITRATE ER 60 MG PO TB24
60.0000 mg | ORAL_TABLET | Freq: Every day | ORAL | 3 refills | Status: DC
  Filled 2024-01-11: qty 90, 90d supply, fill #0

## 2024-01-11 MED ORDER — ATORVASTATIN CALCIUM 80 MG PO TABS
80.0000 mg | ORAL_TABLET | Freq: Every day | ORAL | 3 refills | Status: DC
  Filled 2024-01-11: qty 90, 90d supply, fill #0

## 2024-01-11 MED ORDER — LOSARTAN POTASSIUM 25 MG PO TABS
25.0000 mg | ORAL_TABLET | Freq: Every day | ORAL | 3 refills | Status: DC
  Filled 2024-01-11: qty 90, 90d supply, fill #0

## 2024-02-06 ENCOUNTER — Other Ambulatory Visit: Payer: Self-pay | Admitting: Student

## 2024-02-06 DIAGNOSIS — I25118 Atherosclerotic heart disease of native coronary artery with other forms of angina pectoris: Secondary | ICD-10-CM

## 2024-02-06 DIAGNOSIS — I10 Essential (primary) hypertension: Secondary | ICD-10-CM

## 2024-02-09 ENCOUNTER — Other Ambulatory Visit: Payer: Self-pay | Admitting: Student

## 2024-02-09 ENCOUNTER — Other Ambulatory Visit (HOSPITAL_COMMUNITY): Payer: Self-pay

## 2024-02-09 ENCOUNTER — Other Ambulatory Visit: Payer: Self-pay

## 2024-02-09 DIAGNOSIS — I25118 Atherosclerotic heart disease of native coronary artery with other forms of angina pectoris: Secondary | ICD-10-CM

## 2024-02-09 DIAGNOSIS — I10 Essential (primary) hypertension: Secondary | ICD-10-CM

## 2024-02-09 MED ORDER — METOPROLOL TARTRATE 25 MG PO TABS
25.0000 mg | ORAL_TABLET | Freq: Two times a day (BID) | ORAL | 11 refills | Status: DC
  Filled 2024-02-09: qty 60, 30d supply, fill #0

## 2024-02-09 NOTE — Telephone Encounter (Signed)
 Patient called back - scheduled appt for Monday afternoon.

## 2024-02-09 NOTE — Telephone Encounter (Signed)
 Patient last seen 01/16/23 I called the patient to schedule a appointment. I was unable to reach the patient. I lvm for him to give us  a call back.

## 2024-02-15 ENCOUNTER — Ambulatory Visit: Payer: Self-pay | Admitting: Student

## 2024-02-18 ENCOUNTER — Ambulatory Visit: Payer: Self-pay | Admitting: Student

## 2024-02-18 ENCOUNTER — Other Ambulatory Visit (HOSPITAL_COMMUNITY): Payer: Self-pay

## 2024-02-18 VITALS — BP 122/73 | HR 54 | Temp 97.6°F | Ht 68.0 in | Wt 238.4 lb

## 2024-02-18 DIAGNOSIS — I7121 Aneurysm of the ascending aorta, without rupture: Secondary | ICD-10-CM

## 2024-02-18 DIAGNOSIS — Z0000 Encounter for general adult medical examination without abnormal findings: Secondary | ICD-10-CM

## 2024-02-18 DIAGNOSIS — I10 Essential (primary) hypertension: Secondary | ICD-10-CM

## 2024-02-18 DIAGNOSIS — Z87891 Personal history of nicotine dependence: Secondary | ICD-10-CM

## 2024-02-18 DIAGNOSIS — J439 Emphysema, unspecified: Secondary | ICD-10-CM | POA: Insufficient documentation

## 2024-02-18 DIAGNOSIS — I25118 Atherosclerotic heart disease of native coronary artery with other forms of angina pectoris: Secondary | ICD-10-CM

## 2024-02-18 DIAGNOSIS — C672 Malignant neoplasm of lateral wall of bladder: Secondary | ICD-10-CM

## 2024-02-18 MED ORDER — METOPROLOL TARTRATE 25 MG PO TABS
25.0000 mg | ORAL_TABLET | Freq: Two times a day (BID) | ORAL | 4 refills | Status: AC
  Filled 2024-02-18: qty 180, 90d supply, fill #0

## 2024-02-18 MED ORDER — ATORVASTATIN CALCIUM 80 MG PO TABS
80.0000 mg | ORAL_TABLET | Freq: Every day | ORAL | 4 refills | Status: AC
  Filled 2024-02-18: qty 90, 90d supply, fill #0

## 2024-02-18 MED ORDER — NITROGLYCERIN 0.4 MG SL SUBL
0.4000 mg | SUBLINGUAL_TABLET | SUBLINGUAL | 11 refills | Status: AC | PRN
  Filled 2024-02-18: qty 25, 10d supply, fill #0

## 2024-02-18 MED ORDER — LOSARTAN POTASSIUM 25 MG PO TABS
25.0000 mg | ORAL_TABLET | Freq: Every day | ORAL | 4 refills | Status: AC
  Filled 2024-02-18: qty 90, 90d supply, fill #0

## 2024-02-18 MED ORDER — ISOSORBIDE MONONITRATE ER 60 MG PO TB24
60.0000 mg | ORAL_TABLET | Freq: Every day | ORAL | 4 refills | Status: AC
  Filled 2024-02-18: qty 90, 90d supply, fill #0

## 2024-02-18 NOTE — Assessment & Plan Note (Addendum)
 BMI 36.25; lifestyle and diet counseling provided.

## 2024-02-18 NOTE — Assessment & Plan Note (Addendum)
 Smoking 1 PPD, patient reports that he is not interested in quitting smoking.  Has tried Chantix  in the past and left a bad aftertaste. -Smoking cessation counseling provided

## 2024-02-18 NOTE — Assessment & Plan Note (Addendum)
 Patient with previous history of inferior STEMI with RCA stent in 2019 and residual LAD disease.  His current medications include atorvastatin  80 mg daily, Imdur  60 mg daily, metoprolol  25 mg twice daily, as needed nitroglycerin .  Patient denies any acute chest pains or shortness of breath at this time.  Patient follows with his cardiology, last visit 11/06/2023, no changes to medications noted at this time.  -Continue Atorvastatin  80mg  daily, Isosorbide  Mononitrate 60mg  daily, Losartan  25mg  daily, Metoprolol  25mg  BID, and PRN Nitroglycerin . -Continue following with cardiology -Will follow-up on lipid panel today

## 2024-02-18 NOTE — Patient Instructions (Signed)
 Thank you, Mr.Sherill M Baise for allowing us  to provide your care today. Today we discussed:  I have sent your refills to the pharmacy.   I have ordered the following labs for you:   Lab Orders         Lipid Profile         Basic metabolic panel with GFR      Referrals ordered today:   Referral Orders  No referral(s) requested today     I have ordered the following medication/changed the following medications:   Stop the following medications: Medications Discontinued During This Encounter  Medication Reason   nitroGLYCERIN  (NITROSTAT ) 0.4 MG SL tablet Reorder   atorvastatin  (LIPITOR ) 80 MG tablet Reorder   losartan  (COZAAR ) 25 MG tablet Reorder   isosorbide  mononitrate (IMDUR ) 60 MG 24 hr tablet Reorder   metoprolol  tartrate (LOPRESSOR ) 25 MG tablet Reorder   meclizine  (ANTIVERT ) 25 MG tablet    oxybutynin  (DITROPAN ) 5 MG tablet    oxyCODONE -acetaminophen  (PERCOCET) 5-325 MG tablet    varenicline  (CHANTIX ) 1 MG tablet      Start the following medications: Meds ordered this encounter  Medications   atorvastatin  (LIPITOR ) 80 MG tablet    Sig: Take 1 tablet (80 mg total) by mouth daily at 6 PM.    Dispense:  90 tablet    Refill:  4   isosorbide  mononitrate (IMDUR ) 60 MG 24 hr tablet    Sig: Take 1 tablet (60 mg total) by mouth daily.    Dispense:  90 tablet    Refill:  4   losartan  (COZAAR ) 25 MG tablet    Sig: Take 1 tablet (25 mg total) by mouth daily.    Dispense:  90 tablet    Refill:  4   metoprolol  tartrate (LOPRESSOR ) 25 MG tablet    Sig: Take 1 tablet (25 mg total) by mouth 2 (two) times daily.    Dispense:  180 tablet    Refill:  4    ok to fill 90ds   nitroGLYCERIN  (NITROSTAT ) 0.4 MG SL tablet    Sig: Place 1 tablet (0.4 mg total) under the tongue every 5 (five) minutes as needed for chest pain.    Dispense:  25 tablet    Refill:  11    [facility]     Follow up: 6 months for routine visit     Remember: Should you have any questions or concerns  please call the internal medicine clinic at [phone].    Dr. T[facility]

## 2024-02-18 NOTE — Assessment & Plan Note (Addendum)
 History of asymptomatic thoracic aortic aneurysm. Found to have increase in size from 43mm to 47 mm on echo on 05/03/2020. CTA on 11/02/2020 measuring 42mm. ECHO on 04/19/ 2024 showed ascending aorta (42 mm) which has not increased and size.  CTA on/11/2023 showed 4.1 cm ascending thoracic aortic aneurysm with very minimal increase in size since 11/13/2021. - Advised to continue following up with cardiology and continue taking antihypertensive medications

## 2024-02-18 NOTE — Assessment & Plan Note (Addendum)
 Patient denied flu vaccine and pneumococcal vaccine.  He also denied colonoscopy.

## 2024-02-18 NOTE — Assessment & Plan Note (Addendum)
 Patient with a history of low-grade TA urothelial carcinoma the bladder that was initially diagnosed in 2019 following resection of a greater than 2 cm mass. He had a subsequent UCC recurrence in 2022 and 01/2022 with the same pathology.  Patient had a surveillance cystoscopy on 09/25/2022 that showed 5 mm papillary bladder tumor recurrence adjacent to his previous resection site along the right lateral sidewall.  He is status post cystoscopy with TURBT with gemcitabine  instillation 10/15/2022. He followed up with Dr. Devere on 03/11/2023 for cystoscopy with TURBT and drainage of scrotal abscess.  Patient reports that he finished the chemotherapy last week and has a follow-up visit with urology in April. -Patient is advised to continue following up with his urologist

## 2024-02-18 NOTE — Assessment & Plan Note (Addendum)
 No formal PFT per chart review, but patient is a chronic smoker.  Denies any respiratory symptoms at this time.

## 2024-02-18 NOTE — Assessment & Plan Note (Addendum)
 BP Readings from Last 3 Encounters:  02/18/24 122/73  11/06/23 128/70  03/11/23 115/72  Patient denies any dizziness or lightheadedness with medications.  Refills are sent to his pharmacy. -Continue taking  Isosorbide  Mononitrate 60mg  daily, Losartan  25mg  daily, Metoprolol  25mg  BID - Follow-up on BMP

## 2024-02-18 NOTE — Progress Notes (Signed)
 Internal Medicine Clinic Attending  Case discussed with the resident at the time of the visit.  We reviewed the resident's history and exam and pertinent patient test results.  I agree with the assessment, diagnosis, and plan of care documented in the resident's note.

## 2024-02-18 NOTE — Progress Notes (Signed)
 "  Established Patient Office Visit  Subjective   Patient ID: Darius [name], male    DOB: February 04, 1955  Age: 69 y.o. MRN: [id number]  Chief Complaint  Patient presents with   Routine checkup    Medication Refill    Darius [name] is a 69 y.o. male with PMHx Behcet's disease, active smoking, hyperlipidemia, hypertension, CAD, urothelial carcinoma presents today for routine office visit and medication refill.  Review of Systems:  As per assessment and Plan   Objective:     Vitals:   02/18/24 1602  BP: 122/73  Pulse: (!) 54  Temp: 97.6 F (36.4 C)  TempSrc: Oral  SpO2: 95%  Weight: 238 lb 6.4 oz (108.1 kg)  Height: 5' 8 (1.727 m)    Physical Exam General: Sitting in chair, no acute distress Cardiovascular: Regular rate Pulmonary: Breathing comfortably Abdomen: Soft, nontender, nondistended MSK: No lower extremity edema bilaterally  Last lipids Lab Results  Component Value Date   CHOL 103 10/31/2020   HDL 33 (L) 10/31/2020   LDLCALC 53 10/31/2020   TRIG 87 10/31/2020   CHOLHDL 3.1 10/31/2020      The ASCVD Risk score (Arnett DK, et al., 2019) failed to calculate for the following reasons:   Risk score cannot be calculated because patient has a medical history suggesting prior/existing ASCVD   * - Cholesterol units were assumed    Assessment & Plan:   Patient discussed with Dr. 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 He had a subsequent UCC recurrence in 2022 and 01/2022 with the same pathology.  Patient had a surveillance cystoscopy on 09/25/2022 that showed 5 mm papillary bladder tumor recurrence adjacent to his previous resection site along the right lateral sidewall.  He is status post cystoscopy with TURBT with gemcitabine  instillation 10/15/2022. He followed up with Dr.  Devere on 03/11/2023 for cystoscopy with TURBT and drainage of scrotal abscess.  Patient reports that he finished the chemotherapy last week and has a follow-up visit with urology in April. -Patient is advised to continue following up with his urologist Coronary artery disease involving native coronary artery of native heart with other form of angina pectoris Patient with previous history of inferior STEMI with RCA stent in 2019 and residual LAD disease.  His current medications include atorvastatin  80 mg daily, Imdur  60 mg daily, metoprolol  25 mg twice daily, as needed nitroglycerin .  Patient denies any acute chest pains or shortness of breath at this time.  Patient follows with his cardiology, last visit 11/06/2023, no changes to medications noted at this time.  -Continue Atorvastatin  80mg  daily, Isosorbide  Mononitrate 60mg  daily, Losartan  25mg  daily, Metoprolol  25mg  BID, and PRN Nitroglycerin . -Continue following with cardiology -Will follow-up on lipid panel today Primary hypertension BP Readings from Last 3 Encounters:  02/18/24 122/73  11/06/23 128/70  03/11/23 115/72  Patient denies any dizziness or lightheadedness with medications.  Refills are sent to his pharmacy. -Continue taking  Isosorbide  Mononitrate 60mg  daily, Losartan  25mg  daily, Metoprolol  25mg  BID - Follow-up on BMP Aneurysm of ascending aorta without rupture History of asymptomatic thoracic aortic aneurysm. Found to have increase in size from 43mm to 47 mm on echo on 05/03/2020. CTA on 11/02/2020 measuring 42mm. ECHO on 04/19/ 2024 showed ascending aorta (42 mm) which has not increased and size.  CTA on/11/2023 showed  4.1 cm ascending thoracic aortic aneurysm with very minimal increase in size since 11/13/2021. - Advised to continue following up with cardiology and continue taking antihypertensive medications History of tobacco use Smoking 1 PPD, patient reports that he is not interested in quitting smoking.  Has tried Chantix  in  the past and left a bad aftertaste. -Smoking cessation counseling provided Healthcare maintenance Patient denied flu vaccine and pneumococcal vaccine.  He also denied colonoscopy. Emphysema, unspecified (HCC) No formal PFT per chart review, but patient is a chronic smoker.  Denies any respiratory symptoms at this time. Obesity, morbid (HCC) BMI 36.25; lifestyle and diet counseling provided.    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 He had a subsequent UCC recurrence in 2022 and 01/2022 with the same pathology.  Patient had a surveillance cystoscopy on 09/25/2022 that showed 5 mm papillary bladder tumor recurrence adjacent to his previous resection site along the right lateral sidewall.  He is status post cystoscopy with TURBT with gemcitabine  instillation 10/15/2022. He followed up with Dr. Devere on 03/11/2023 for cystoscopy with TURBT and drainage of scrotal abscess.  Patient reports that he finished the chemotherapy last week and has a follow-up visit with urology in April. -Patient is advised to continue following up with his urologist      CAD (coronary artery disease), native coronary artery (Chronic)   Patient with previous history of inferior STEMI with RCA stent in 2019 and residual LAD disease.  His current medications include atorvastatin  80 mg daily, Imdur  60 mg daily, metoprolol  25 mg twice daily, as needed nitroglycerin .  Patient denies any acute chest pains or shortness of breath at this time.  Patient follows with his cardiology, last visit 11/06/2023, no changes to medications noted at this time.  -Continue Atorvastatin  80mg  daily, Isosorbide  Mononitrate 60mg  daily, Losartan  25mg  daily, Metoprolol  25mg  BID, and PRN Nitroglycerin . -Continue following  with cardiology -Will follow-up on lipid panel today      Relevant Medications   atorvastatin  (LIPITOR ) 80 MG tablet   isosorbide  mononitrate (IMDUR ) 60 MG 24 hr tablet   losartan  (COZAAR ) 25 MG tablet   metoprolol  tartrate (LOPRESSOR ) 25 MG tablet   nitroGLYCERIN  (NITROSTAT ) 0.4 MG SL tablet   Other Relevant Orders   Lipid Profile   HTN (hypertension)   BP Readings from Last 3 Encounters:  02/18/24 122/73  11/06/23 128/70  03/11/23 115/72  Patient denies any dizziness or lightheadedness with medications.  Refills are sent to his pharmacy. -Continue taking  Isosorbide  Mononitrate 60mg  daily, Losartan  25mg  daily, Metoprolol  25mg  BID - Follow-up on BMP      Relevant Medications   atorvastatin  (LIPITOR ) 80 MG tablet   isosorbide  mononitrate (IMDUR ) 60 MG 24 hr tablet   losartan  (COZAAR ) 25 MG tablet   metoprolol  tartrate (LOPRESSOR ) 25 MG tablet   nitroGLYCERIN  (NITROSTAT ) 0.4 MG SL tablet   Other Relevant Orders   Basic metabolic panel with GFR   History of tobacco use   Smoking 1 PPD, patient reports that he is not interested in quitting smoking.  Has tried Chantix  in the past and left a bad aftertaste. -Smoking cessation counseling provided      Healthcare maintenance   Patient denied flu vaccine and pneumococcal vaccine.  He also denied colonoscopy.  Thoracic aortic aneurysm   History of asymptomatic thoracic aortic aneurysm. Found to have increase in size from 43mm to 47 mm on echo on 05/03/2020. CTA on 11/02/2020 measuring 42mm. ECHO on 04/19/ 2024 showed ascending aorta (42 mm) which has not increased and size.  CTA on/11/2023 showed 4.1 cm ascending thoracic aortic aneurysm with very minimal increase in size since 11/13/2021. - Advised to continue following up with cardiology and continue taking antihypertensive medications      Relevant Medications   atorvastatin  (LIPITOR ) 80 MG tablet   isosorbide  mononitrate (IMDUR ) 60 MG 24 hr tablet   losartan  (COZAAR ) 25  MG tablet   metoprolol  tartrate (LOPRESSOR ) 25 MG tablet   nitroGLYCERIN  (NITROSTAT ) 0.4 MG SL tablet   Emphysema, unspecified (HCC)   No formal PFT per chart review, but patient is a chronic smoker.  Denies any respiratory symptoms at this time.      Obesity, morbid (HCC)   BMI 36.25; lifestyle and diet counseling provided.       Return in about 6 months (around 08/17/2024) for Routine visit .    Toma Edwards, DO "

## 2024-02-19 ENCOUNTER — Ambulatory Visit: Payer: Self-pay | Admitting: Student

## 2024-02-19 DIAGNOSIS — E875 Hyperkalemia: Secondary | ICD-10-CM

## 2024-02-19 LAB — BASIC METABOLIC PANEL WITH GFR
BUN/Creatinine Ratio: 24 (ref 10–24)
BUN: 21 mg/dL (ref 8–27)
CO2: 26 mmol/L (ref 20–29)
Calcium: 9.8 mg/dL (ref 8.6–10.2)
Chloride: 104 mmol/L (ref 96–106)
Creatinine, Ser: 0.89 mg/dL (ref 0.76–1.27)
Glucose: 117 mg/dL — ABNORMAL HIGH (ref 70–99)
Potassium: 5.4 mmol/L — ABNORMAL HIGH (ref 3.5–5.2)
Sodium: 143 mmol/L (ref 134–144)
eGFR: 95 mL/min/{1.73_m2}

## 2024-02-19 LAB — LIPID PANEL
Chol/HDL Ratio: 4.2 ratio (ref 0.0–5.0)
Cholesterol, Total: 123 mg/dL (ref 100–199)
HDL: 29 mg/dL — ABNORMAL LOW
LDL Chol Calc (NIH): 61 mg/dL (ref 0–99)
Triglycerides: 195 mg/dL — ABNORMAL HIGH (ref 0–149)
VLDL Cholesterol Cal: 33 mg/dL (ref 5–40)
